# Patient Record
Sex: Male | Born: 1960
Health system: Southern US, Community
[De-identification: ages and names within clinical notes are randomized; demographics above are authoritative.]

## PROBLEM LIST (undated history)

## (undated) DIAGNOSIS — J189 Pneumonia, unspecified organism: Secondary | ICD-10-CM

## (undated) DIAGNOSIS — J869 Pyothorax without fistula: Secondary | ICD-10-CM

## (undated) DIAGNOSIS — R519 Headache, unspecified: Secondary | ICD-10-CM

## (undated) DIAGNOSIS — F329 Major depressive disorder, single episode, unspecified: Secondary | ICD-10-CM

## (undated) DIAGNOSIS — F32A Depression, unspecified: Secondary | ICD-10-CM

## (undated) DIAGNOSIS — I10 Essential (primary) hypertension: Secondary | ICD-10-CM

## (undated) DIAGNOSIS — R51 Headache: Secondary | ICD-10-CM

## (undated) HISTORY — PX: FINGER SURGERY: SHX640

## (undated) HISTORY — DX: Depression, unspecified: F32.A

## (undated) HISTORY — DX: Headache: R51

## (undated) HISTORY — DX: Major depressive disorder, single episode, unspecified: F32.9

## (undated) HISTORY — PX: OTHER SURGICAL HISTORY: SHX169

## (undated) HISTORY — DX: Pneumonia, unspecified organism: J18.9

## (undated) HISTORY — DX: Headache, unspecified: R51.9

## (undated) HISTORY — DX: Pyothorax without fistula: J86.9

---

## 1999-05-30 ENCOUNTER — Encounter: Payer: Self-pay | Admitting: Family Medicine

## 1999-05-30 ENCOUNTER — Encounter: Admission: RE | Admit: 1999-05-30 | Discharge: 1999-05-30 | Payer: Self-pay | Admitting: Family Medicine

## 2002-12-11 ENCOUNTER — Encounter: Payer: Self-pay | Admitting: Family Medicine

## 2002-12-11 ENCOUNTER — Encounter: Admission: RE | Admit: 2002-12-11 | Discharge: 2002-12-11 | Payer: Self-pay | Admitting: Family Medicine

## 2004-03-10 ENCOUNTER — Emergency Department (HOSPITAL_COMMUNITY): Admission: EM | Admit: 2004-03-10 | Discharge: 2004-03-10 | Payer: Self-pay | Admitting: Emergency Medicine

## 2008-01-01 ENCOUNTER — Emergency Department (HOSPITAL_COMMUNITY): Admission: EM | Admit: 2008-01-01 | Discharge: 2008-01-01 | Payer: Self-pay | Admitting: Family Medicine

## 2015-09-07 ENCOUNTER — Ambulatory Visit: Payer: Self-pay | Admitting: Family Medicine

## 2015-11-04 ENCOUNTER — Encounter: Payer: Self-pay | Admitting: Family Medicine

## 2015-11-04 ENCOUNTER — Encounter (INDEPENDENT_AMBULATORY_CARE_PROVIDER_SITE_OTHER): Payer: Self-pay

## 2015-11-04 ENCOUNTER — Ambulatory Visit (INDEPENDENT_AMBULATORY_CARE_PROVIDER_SITE_OTHER): Payer: BLUE CROSS/BLUE SHIELD | Admitting: Family Medicine

## 2015-11-04 VITALS — BP 122/88 | HR 87 | Temp 98.5°F | Ht 67.5 in | Wt 183.8 lb

## 2015-11-04 DIAGNOSIS — Z1211 Encounter for screening for malignant neoplasm of colon: Secondary | ICD-10-CM | POA: Diagnosis not present

## 2015-11-04 DIAGNOSIS — Z0001 Encounter for general adult medical examination with abnormal findings: Secondary | ICD-10-CM

## 2015-11-04 DIAGNOSIS — R030 Elevated blood-pressure reading, without diagnosis of hypertension: Secondary | ICD-10-CM

## 2015-11-04 DIAGNOSIS — Z113 Encounter for screening for infections with a predominantly sexual mode of transmission: Secondary | ICD-10-CM | POA: Diagnosis not present

## 2015-11-04 DIAGNOSIS — Z0000 Encounter for general adult medical examination without abnormal findings: Secondary | ICD-10-CM | POA: Insufficient documentation

## 2015-11-04 DIAGNOSIS — R6889 Other general symptoms and signs: Secondary | ICD-10-CM

## 2015-11-04 DIAGNOSIS — Z13220 Encounter for screening for lipoid disorders: Secondary | ICD-10-CM | POA: Diagnosis not present

## 2015-11-04 DIAGNOSIS — K5900 Constipation, unspecified: Secondary | ICD-10-CM | POA: Diagnosis not present

## 2015-11-04 DIAGNOSIS — I10 Essential (primary) hypertension: Secondary | ICD-10-CM | POA: Insufficient documentation

## 2015-11-04 DIAGNOSIS — IMO0001 Reserved for inherently not codable concepts without codable children: Secondary | ICD-10-CM

## 2015-11-04 NOTE — Progress Notes (Signed)
Patient ID: NANCY MANUELE, male   DOB: September 14, 1960, 55 y.o.   MRN: [id number]  Tommi Rumps, MD Phone: [phone]  THUNDER BRIDGEWATER is a 55 y.o. male who presents today for new patient visit.  Reports he is here for a physical exam. Diet consists of mostly fast food and junk food. Has decreased to once a day. Minimal vegetables. Walks 2 miles total per week. HIV test done 1 year ago. Hepatitis C done 2 years ago. Tetanus done 2 years ago. No prior colonoscopy. No family history of prostate cancer. Is followed by ophthalmology for vision. Also sees a dentist. Note some constipation. Has a bowel movement every 2-3 days. No abdominal discomfort. No blood in his stool. Large round balls. Intermittently he strains. Attributes this to his diet. Patient also notes he states hot all the time. Intermittently sweats. No palpitations. No hair changes. No skin changes. No family history of thyroid dysfunction. No night sweats.  Active Ambulatory Problems    Diagnosis Date Noted  . Encounter for general adult medical examination with abnormal findings 11/04/2015  . Elevated blood pressure 11/04/2015  . Constipation 11/04/2015  . Heat intolerance 11/04/2015   Resolved Ambulatory Problems    Diagnosis Date Noted  . No Resolved Ambulatory Problems   Past Medical History  Diagnosis Date  . Depression   . Frequent headaches     Family History  Problem Relation Age of Onset  . Depression      Social History   Social History  . Marital Status: Single    Spouse Name: N/A  . Number of Children: N/A  . Years of Education: N/A   Occupational History  . Not on file.   Social History Main Topics  . Smoking status: Never Smoker   . Smokeless tobacco: Never Used  . Alcohol Use: No  . Drug Use: No  . Sexual Activity: Not on file   Other Topics Concern  . Not on file   Social History Narrative  . No narrative on file    ROS  General:  Negative for nexplained weight loss,  fever Skin: Negative for new or changing mole, sore that won't heal HEENT: Positive for trouble seeing, Negative for trouble hearing, ringing in ears, mouth sores, hoarseness, change in voice, dysphagia. CV:  Negative for chest pain, dyspnea, edema, palpitations Resp: Negative for cough, dyspnea, hemoptysis GI: Positive for constipation, Negative for nausea, vomiting, diarrhea, abdominal pain, melena, hematochezia. GU: Negative for dysuria, incontinence, urinary hesitance, hematuria, vaginal or penile discharge, polyuria, sexual difficulty, lumps in testicle or breasts MSK: Negative for muscle cramps or aches, joint pain or swelling Neuro: Negative for headaches, weakness, numbness, dizziness, passing out/fainting Psych: Negative for depression, anxiety, memory problems  Objective  Physical Exam Filed Vitals:   11/04/15 1517 11/04/15 1547  BP: 130/94 122/88  Pulse: 87   Temp: 98.5 F (36.9 C)     BP Readings from Last 3 Encounters:  11/04/15 122/88   Wt Readings from Last 3 Encounters:  11/04/15 183 lb 12.8 oz (83.371 kg)    Physical Exam  Constitutional: He is well-developed, well-nourished, and in no distress.  HENT:  Head: Normocephalic and atraumatic.  Right Ear: External ear normal.  Left Ear: External ear normal.  Mouth/Throat: Oropharynx is clear and moist. No oropharyngeal exudate.  Eyes: Conjunctivae are normal. Pupils are equal, round, and reactive to light.  Neck: Neck supple.  Cardiovascular: Normal rate, regular rhythm and normal heart sounds.   Pulmonary/Chest: Effort normal and  breath sounds normal.  Abdominal: Soft. Bowel sounds are normal. He exhibits no distension. There is no tenderness. There is no rebound and no guarding.  Genitourinary:  Declined prostate exam  Musculoskeletal: He exhibits no edema.  Lymphadenopathy:    He has no cervical adenopathy.  Neurological: He is alert. Gait normal.  Skin: Skin is warm and dry. He is not diaphoretic.    Psychiatric: Mood and affect normal.     Assessment/Plan:   Encounter for general adult medical examination with abnormal findings Blood pressure slightly elevated. He will return for recheck in 1 week. HIV up-to-date. He is requesting STD screening though. This will be completed as outlined below. Hepatitis C was done 2 years ago. Tetanus vaccine is up-to-date. He will be referred to GI for colonoscopy. Discussed diet and exercise at length with patient and patient will make small changes to begin with to 2-3 meals a week and snacks. Labs as outlined below.  Elevated blood pressure Elevated on initial check. We'll have him return in 1 week for repeat with nursing in for fasting lab work.  Constipation Suspect this is related to his poor diet. Discussed dietary changes. Discussed addition of fiber. Patient is hesitant to add medications to help with this. He'll continue to monitor.   Heat intolerance Patient states hot all the time. No other symptoms of hyperthyroidism. We'll check a TSH.    Orders Placed This Encounter  Procedures  . HIV antibody (with reflex)    Standing Status: Future     Number of Occurrences:      Standing Expiration Date: 11/03/2016  . RPR    Standing Status: Future     Number of Occurrences:      Standing Expiration Date: 11/03/2016  . Comp Met (CMET)    Standing Status: Future     Number of Occurrences:      Standing Expiration Date: 11/03/2016  . TSH    Standing Status: Future     Number of Occurrences:      Standing Expiration Date: 11/03/2016  . CBC    Standing Status: Future     Number of Occurrences:      Standing Expiration Date: 11/03/2016  . HgB A1c    Standing Status: Future     Number of Occurrences:      Standing Expiration Date: 11/03/2016  . Lipid Profile    Standing Status: Future     Number of Occurrences:      Standing Expiration Date: 11/03/2016  . PSA    Standing Status: Future     Number of Occurrences:      Standing  Expiration Date: 11/03/2016  . Ambulatory referral to Gastroenterology    Referral Priority:  Routine    Referral Type:  Consultation    Referral Reason:  Specialty Services Required    Number of Visits Requested:  Wood, MD [place]

## 2015-11-04 NOTE — Patient Instructions (Signed)
Nice to meet you. We will check some lab work to evaluate for [employer] a hot all the time. We will also obtain screening lab work. We will get you referred for colonoscopy. Please start working on your diet by changing 2-3 meals a week and snacks a week. Snacks can include vegetables and fruit. He should add vegetables to dinner. Increase her exercises well.

## 2015-11-04 NOTE — Assessment & Plan Note (Signed)
Patient states hot all the time. No other symptoms of hyperthyroidism. We'll check a TSH.

## 2015-11-04 NOTE — Assessment & Plan Note (Signed)
Blood pressure slightly elevated. He will return for recheck in 1 week. HIV up-to-date. He is requesting STD screening though. This will be completed as outlined below. Hepatitis C was done 2 years ago. Tetanus vaccine is up-to-date. He will be referred to GI for colonoscopy. Discussed diet and exercise at length with patient and patient will make small changes to begin with to 2-3 meals a week and snacks. Labs as outlined below.

## 2015-11-04 NOTE — Assessment & Plan Note (Signed)
Suspect this is related to his poor diet. Discussed dietary changes. Discussed addition of fiber. Patient is hesitant to add medications to help with this. He'll continue to monitor.

## 2015-11-04 NOTE — Progress Notes (Signed)
Pre visit review using our clinic review tool, if applicable. No additional management support is needed unless otherwise documented below in the visit note. 

## 2015-11-04 NOTE — Assessment & Plan Note (Signed)
Elevated on initial check. We'll have him return in 1 week for repeat with nursing in for fasting lab work.

## 2015-11-25 ENCOUNTER — Other Ambulatory Visit (INDEPENDENT_AMBULATORY_CARE_PROVIDER_SITE_OTHER): Payer: BLUE CROSS/BLUE SHIELD

## 2015-11-25 ENCOUNTER — Other Ambulatory Visit: Payer: Self-pay | Admitting: Family Medicine

## 2015-11-25 DIAGNOSIS — Z0001 Encounter for general adult medical examination with abnormal findings: Secondary | ICD-10-CM | POA: Diagnosis not present

## 2015-11-25 DIAGNOSIS — Z13220 Encounter for screening for lipoid disorders: Secondary | ICD-10-CM | POA: Diagnosis not present

## 2015-11-25 DIAGNOSIS — Z113 Encounter for screening for infections with a predominantly sexual mode of transmission: Secondary | ICD-10-CM | POA: Diagnosis not present

## 2015-11-25 DIAGNOSIS — R6889 Other general symptoms and signs: Secondary | ICD-10-CM

## 2015-11-25 LAB — COMPREHENSIVE METABOLIC PANEL
ALT: 25 U/L (ref 0–53)
AST: 19 U/L (ref 0–37)
Albumin: 4.4 g/dL (ref 3.5–5.2)
Alkaline Phosphatase: 64 U/L (ref 39–117)
BUN: 22 mg/dL (ref 6–23)
CALCIUM: 9.4 mg/dL (ref 8.4–10.5)
CHLORIDE: 106 meq/L (ref 96–112)
CO2: 28 meq/L (ref 19–32)
Creatinine, Ser: 1.18 mg/dL (ref 0.40–1.50)
GFR: 68.7 mL/min (ref >60.00)
Glucose, Bld: 105 mg/dL — ABNORMAL HIGH (ref 70–99)
POTASSIUM: 4.2 meq/L (ref 3.5–5.1)
Sodium: 141 mEq/L (ref 135–145)
Total Bilirubin: 0.7 mg/dL (ref 0.2–1.2)
Total Protein: 7.6 g/dL (ref 6.0–8.3)

## 2015-11-25 LAB — CBC
HEMATOCRIT: 48.1 % (ref 39.0–52.0)
HEMOGLOBIN: 16 g/dL (ref 13.0–17.0)
MCHC: 33.3 g/dL (ref 30.0–36.0)
MCV: 87 fl (ref 78.0–100.0)
PLATELETS: 197 10*3/uL (ref 150.0–400.0)
RBC: 5.52 Mil/uL (ref 4.22–5.81)
RDW: 13.7 % (ref 11.5–15.5)
WBC: 7.6 10*3/uL (ref 4.0–10.5)

## 2015-11-25 LAB — LIPID PANEL
CHOL/HDL RATIO: 4
Cholesterol: 153 mg/dL (ref 0–200)
HDL: 35.5 mg/dL — AB (ref >39.00)
LDL Cholesterol: 96 mg/dL (ref 0–99)
NONHDL: 117.27
Triglycerides: 106 mg/dL (ref 0.0–149.0)
VLDL: 21.2 mg/dL (ref 0.0–40.0)

## 2015-11-25 LAB — HEMOGLOBIN A1C: HEMOGLOBIN A1C: 5.5 % (ref 4.6–6.5)

## 2015-11-25 LAB — PSA: PSA: 3.08 ng/mL (ref 0.10–4.00)

## 2015-11-25 LAB — TSH: TSH: 1.01 u[IU]/mL (ref 0.35–4.50)

## 2015-11-25 NOTE — Addendum Note (Signed)
Addended by: Jennelle HumanNEWTON, Akeia Perot E on: 11/25/2015 09:17 AM   Modules accepted: Orders

## 2015-11-26 LAB — HIV ANTIBODY (ROUTINE TESTING W REFLEX): HIV: NONREACTIVE

## 2015-11-26 LAB — GC/CHLAMYDIA PROBE AMP
CT PROBE, AMP APTIMA: NOT DETECTED
GC Probe RNA: NOT DETECTED

## 2015-11-26 LAB — RPR

## 2015-12-01 ENCOUNTER — Encounter: Payer: Self-pay | Admitting: Family Medicine

## 2015-12-07 ENCOUNTER — Encounter: Payer: Self-pay | Admitting: Family Medicine

## 2015-12-07 ENCOUNTER — Ambulatory Visit (INDEPENDENT_AMBULATORY_CARE_PROVIDER_SITE_OTHER): Payer: BLUE CROSS/BLUE SHIELD | Admitting: Family Medicine

## 2015-12-07 VITALS — BP 135/92 | HR 83 | Temp 98.1°F | Wt 189.0 lb

## 2015-12-07 DIAGNOSIS — Q828 Other specified congenital malformations of skin: Secondary | ICD-10-CM | POA: Diagnosis not present

## 2015-12-07 DIAGNOSIS — I10 Essential (primary) hypertension: Secondary | ICD-10-CM | POA: Diagnosis not present

## 2015-12-07 DIAGNOSIS — L918 Other hypertrophic disorders of the skin: Secondary | ICD-10-CM | POA: Diagnosis not present

## 2015-12-07 MED ORDER — AMLODIPINE BESYLATE 5 MG PO TABS
5.0000 mg | ORAL_TABLET | Freq: Every day | ORAL | Status: DC
Start: 2015-12-07 — End: 2016-11-22

## 2015-12-07 NOTE — Patient Instructions (Signed)
Nice to see you. Your blood pressure remains elevated today. We'll start you on a low dose of amlodipine. You should monitor your blood pressure at home and if not improving to less than 140/90 please let us know. I'll see you back in a month for recheck of this. I have also referred you to dermatology.

## 2015-12-07 NOTE — Progress Notes (Signed)
Patient ID: Caleb Avila, male   DOB: 08/09/1960, 55 y.o.   MRN: [id number]  Caleb AlarEric Yariela Tison, MD Phone: [phone]  Caleb Avila is a 55 y.o. male who presents today for f/u.  Elevated blood pressure: Elevated on first check at last office visit return to slightly below goal diastolically. Elevated today. No chest pain or shortness of breath or edema. No medications at this time.  Skin tags: Patient notes these around his neck. They get irritated and get tugged on and occasionally get pulled off. He has not had any of them removed by a physician previously.   PMH: nonsmoker.   ROS see history of present illness  Objective  Physical Exam Filed Vitals:   12/07/15 1524  BP: 135/92  Pulse: 83  Temp: 98.1 F (36.7 C)    BP Readings from Last 3 Encounters:  12/07/15 135/92  11/04/15 122/88   Wt Readings from Last 3 Encounters:  12/07/15 189 lb (85.73 kg)  11/04/15 183 lb 12.8 oz (83.371 kg)    Physical Exam  Constitutional: He is well-developed, well-nourished, and in no distress.  HENT:  Head: Normocephalic and atraumatic.  Right Ear: External ear normal.  Left Ear: External ear normal.  Scattered skin tags around neck  Cardiovascular: Normal rate, regular rhythm and normal heart sounds.   Pulmonary/Chest: Effort normal and breath sounds normal.  Neurological: He is alert. Gait normal.  Skin: Skin is warm and dry. He is not diaphoretic.     Assessment/Plan: Please see individual problem list.  Essential hypertension Elevated on 2 occasions. Start on amlodipine. Prior lab work reviewed with patient.  Cutaneous skin tags Refer to dermatology for consideration of cryotherapy    Orders Placed This Encounter  Procedures  . Ambulatory referral to Dermatology    Referral Priority:  Routine    Referral Type:  Consultation    Referral Reason:  Specialty Services Required    Requested Specialty:  Dermatology    Number of Visits Requested:  1    Meds  ordered this encounter  Medications  . amLODipine (NORVASC) 5 MG tablet    Sig: Take 1 tablet (5 mg total) by mouth daily.    Dispense:  90 tablet    Refill:  3   Caleb AlarEric Ikran Patman, MD [facility] [place]

## 2015-12-07 NOTE — Assessment & Plan Note (Signed)
Elevated on 2 occasions. Start on amlodipine. Prior lab work reviewed with patient.

## 2015-12-07 NOTE — Progress Notes (Signed)
Pre visit review using our clinic review tool, if applicable. No additional management support is needed unless otherwise documented below in the visit note. 

## 2015-12-07 NOTE — Assessment & Plan Note (Signed)
Refer to dermatology for consideration of cryotherapy

## 2016-01-09 ENCOUNTER — Encounter: Payer: Self-pay | Admitting: Family Medicine

## 2016-01-09 ENCOUNTER — Ambulatory Visit: Payer: BLUE CROSS/BLUE SHIELD | Admitting: Family Medicine

## 2016-11-22 ENCOUNTER — Other Ambulatory Visit: Payer: Self-pay | Admitting: Family Medicine

## 2016-11-22 DIAGNOSIS — I10 Essential (primary) hypertension: Secondary | ICD-10-CM

## 2017-01-07 ENCOUNTER — Emergency Department (HOSPITAL_COMMUNITY): Payer: BLUE CROSS/BLUE SHIELD

## 2017-01-07 ENCOUNTER — Encounter (HOSPITAL_COMMUNITY): Payer: Self-pay | Admitting: *Deleted

## 2017-01-07 ENCOUNTER — Inpatient Hospital Stay (HOSPITAL_COMMUNITY)
Admission: EM | Admit: 2017-01-07 | Discharge: 2017-01-20 | DRG: 853 | Disposition: A | Discharge status: Home / Self Care | Payer: BLUE CROSS/BLUE SHIELD | Attending: Internal Medicine | Admitting: Internal Medicine

## 2017-01-07 DIAGNOSIS — Z79899 Other long term (current) drug therapy: Secondary | ICD-10-CM

## 2017-01-07 DIAGNOSIS — R0682 Tachypnea, not elsewhere classified: Secondary | ICD-10-CM | POA: Diagnosis not present

## 2017-01-07 DIAGNOSIS — J9601 Acute respiratory failure with hypoxia: Secondary | ICD-10-CM | POA: Diagnosis present

## 2017-01-07 DIAGNOSIS — J869 Pyothorax without fistula: Secondary | ICD-10-CM | POA: Diagnosis present

## 2017-01-07 DIAGNOSIS — K5900 Constipation, unspecified: Secondary | ICD-10-CM | POA: Diagnosis present

## 2017-01-07 DIAGNOSIS — J189 Pneumonia, unspecified organism: Secondary | ICD-10-CM

## 2017-01-07 DIAGNOSIS — T17990A Other foreign object in respiratory tract, part unspecified in causing asphyxiation, initial encounter: Secondary | ICD-10-CM | POA: Diagnosis present

## 2017-01-07 DIAGNOSIS — R0602 Shortness of breath: Secondary | ICD-10-CM

## 2017-01-07 DIAGNOSIS — J181 Lobar pneumonia, unspecified organism: Secondary | ICD-10-CM | POA: Diagnosis not present

## 2017-01-07 DIAGNOSIS — A419 Sepsis, unspecified organism: Secondary | ICD-10-CM | POA: Diagnosis not present

## 2017-01-07 DIAGNOSIS — E876 Hypokalemia: Secondary | ICD-10-CM | POA: Diagnosis present

## 2017-01-07 DIAGNOSIS — R0781 Pleurodynia: Secondary | ICD-10-CM | POA: Diagnosis not present

## 2017-01-07 DIAGNOSIS — I10 Essential (primary) hypertension: Secondary | ICD-10-CM | POA: Diagnosis present

## 2017-01-07 DIAGNOSIS — I248 Other forms of acute ischemic heart disease: Secondary | ICD-10-CM | POA: Diagnosis present

## 2017-01-07 DIAGNOSIS — Z818 Family history of other mental and behavioral disorders: Secondary | ICD-10-CM | POA: Diagnosis not present

## 2017-01-07 DIAGNOSIS — R0789 Other chest pain: Secondary | ICD-10-CM | POA: Diagnosis not present

## 2017-01-07 DIAGNOSIS — J939 Pneumothorax, unspecified: Secondary | ICD-10-CM | POA: Diagnosis not present

## 2017-01-07 DIAGNOSIS — R0600 Dyspnea, unspecified: Secondary | ICD-10-CM | POA: Diagnosis not present

## 2017-01-07 DIAGNOSIS — Z4682 Encounter for fitting and adjustment of non-vascular catheter: Secondary | ICD-10-CM | POA: Diagnosis not present

## 2017-01-07 DIAGNOSIS — M546 Pain in thoracic spine: Secondary | ICD-10-CM | POA: Diagnosis not present

## 2017-01-07 DIAGNOSIS — J90 Pleural effusion, not elsewhere classified: Secondary | ICD-10-CM | POA: Diagnosis not present

## 2017-01-07 DIAGNOSIS — R000 Tachycardia, unspecified: Secondary | ICD-10-CM | POA: Diagnosis present

## 2017-01-07 DIAGNOSIS — I447 Left bundle-branch block, unspecified: Secondary | ICD-10-CM | POA: Diagnosis not present

## 2017-01-07 DIAGNOSIS — R0902 Hypoxemia: Secondary | ICD-10-CM | POA: Diagnosis present

## 2017-01-07 DIAGNOSIS — R079 Chest pain, unspecified: Secondary | ICD-10-CM | POA: Diagnosis not present

## 2017-01-07 DIAGNOSIS — Z9889 Other specified postprocedural states: Secondary | ICD-10-CM

## 2017-01-07 DIAGNOSIS — R740 Nonspecific elevation of levels of transaminase and lactic acid dehydrogenase [LDH]: Secondary | ICD-10-CM | POA: Diagnosis not present

## 2017-01-07 DIAGNOSIS — J9811 Atelectasis: Secondary | ICD-10-CM | POA: Diagnosis present

## 2017-01-07 DIAGNOSIS — R748 Abnormal levels of other serum enzymes: Secondary | ICD-10-CM | POA: Diagnosis not present

## 2017-01-07 DIAGNOSIS — R9431 Abnormal electrocardiogram [ECG] [EKG]: Secondary | ICD-10-CM | POA: Diagnosis not present

## 2017-01-07 DIAGNOSIS — R52 Pain, unspecified: Secondary | ICD-10-CM | POA: Diagnosis not present

## 2017-01-07 DIAGNOSIS — J948 Other specified pleural conditions: Secondary | ICD-10-CM | POA: Diagnosis not present

## 2017-01-07 HISTORY — DX: Essential (primary) hypertension: I10

## 2017-01-07 HISTORY — DX: Pneumonia, unspecified organism: J18.9

## 2017-01-07 LAB — CBC
HCT: 40.3 % (ref 39.0–52.0)
Hemoglobin: 13.8 g/dL (ref 13.0–17.0)
MCH: 29.5 pg (ref 26.0–34.0)
MCHC: 34.2 g/dL (ref 30.0–36.0)
MCV: 86.1 fL (ref 78.0–100.0)
PLATELETS: 211 10*3/uL (ref 150–400)
RBC: 4.68 MIL/uL (ref 4.22–5.81)
RDW: 13.2 % (ref 11.5–15.5)
WBC: 17.1 10*3/uL — ABNORMAL HIGH (ref 4.0–10.5)

## 2017-01-07 LAB — BASIC METABOLIC PANEL
Anion gap: 9 (ref 5–15)
BUN: 19 mg/dL (ref 6–20)
CHLORIDE: 105 mmol/L (ref 101–111)
CO2: 22 mmol/L (ref 22–32)
CREATININE: 1.16 mg/dL (ref 0.61–1.24)
Calcium: 8.8 mg/dL — ABNORMAL LOW (ref 8.9–10.3)
GFR calc Af Amer: >60 mL/min (ref >60)
GFR calc non Af Amer: >60 mL/min (ref >60)
GLUCOSE: 140 mg/dL — AB (ref 65–99)
Potassium: 3.8 mmol/L (ref 3.5–5.1)
Sodium: 136 mmol/L (ref 135–145)

## 2017-01-07 LAB — I-STAT TROPONIN, ED: Troponin i, poc: 0 ng/mL (ref 0.00–0.08)

## 2017-01-07 LAB — I-STAT CHEM 8, ED
BUN: 23 mg/dL — AB (ref 6–20)
CREATININE: 1 mg/dL (ref 0.61–1.24)
Calcium, Ion: 1.09 mmol/L — ABNORMAL LOW (ref 1.15–1.40)
Chloride: 103 mmol/L (ref 101–111)
Glucose, Bld: 141 mg/dL — ABNORMAL HIGH (ref 65–99)
HEMATOCRIT: 43 % (ref 39.0–52.0)
Hemoglobin: 14.6 g/dL (ref 13.0–17.0)
Potassium: 3.8 mmol/L (ref 3.5–5.1)
Sodium: 141 mmol/L (ref 135–145)
TCO2: 23 mmol/L (ref 0–100)

## 2017-01-07 LAB — PROCALCITONIN: Procalcitonin: 0.39 ng/mL

## 2017-01-07 LAB — TROPONIN I: Troponin I: <0.03 ng/mL (ref <0.03)

## 2017-01-07 LAB — LACTIC ACID, PLASMA
Lactic Acid, Venous: 0.9 mmol/L (ref 0.5–1.9)
Lactic Acid, Venous: 1.3 mmol/L (ref 0.5–1.9)

## 2017-01-07 MED ORDER — CEFTRIAXONE SODIUM 1 G IJ SOLR
1.0000 g | INTRAMUSCULAR | Status: DC
  Administered 2017-01-07: 1 g via INTRAVENOUS
  Filled 2017-01-07: qty 10

## 2017-01-07 MED ORDER — ADULT MULTIVITAMIN W/MINERALS CH
1.0000 | ORAL_TABLET | Freq: Every day | ORAL | Status: DC
  Administered 2017-01-08 – 2017-01-13 (×6): 1 via ORAL
  Filled 2017-01-07 (×6): qty 1

## 2017-01-07 MED ORDER — IOPAMIDOL (ISOVUE-370) INJECTION 76%
INTRAVENOUS | Status: AC
  Administered 2017-01-07: 100 mL via INTRAVENOUS
  Filled 2017-01-07: qty 100

## 2017-01-07 MED ORDER — HYDROXYZINE HCL 10 MG PO TABS
10.0000 mg | ORAL_TABLET | Freq: Three times a day (TID) | ORAL | Status: DC | PRN
  Filled 2017-01-07: qty 1

## 2017-01-07 MED ORDER — ZOLPIDEM TARTRATE 5 MG PO TABS
5.0000 mg | ORAL_TABLET | Freq: Every evening | ORAL | Status: DC | PRN
  Administered 2017-01-07 – 2017-01-12 (×3): 5 mg via ORAL
  Filled 2017-01-07 (×3): qty 1

## 2017-01-07 MED ORDER — ENOXAPARIN SODIUM 40 MG/0.4ML ~~LOC~~ SOLN
40.0000 mg | SUBCUTANEOUS | Status: DC
  Administered 2017-01-07 – 2017-01-12 (×6): 40 mg via SUBCUTANEOUS
  Filled 2017-01-07 (×6): qty 0.4

## 2017-01-07 MED ORDER — LEVALBUTEROL HCL 1.25 MG/0.5ML IN NEBU: 1.2500 mg | INHALATION_SOLUTION | Freq: Three times a day (TID) | RESPIRATORY_TRACT | Status: DC

## 2017-01-07 MED ORDER — ACETAMINOPHEN 325 MG PO TABS
650.0000 mg | ORAL_TABLET | Freq: Four times a day (QID) | ORAL | Status: DC | PRN
  Administered 2017-01-08 – 2017-01-11 (×5): 650 mg via ORAL
  Filled 2017-01-07 (×6): qty 2

## 2017-01-07 MED ORDER — AMLODIPINE BESYLATE 5 MG PO TABS
5.0000 mg | ORAL_TABLET | Freq: Every day | ORAL | Status: DC
  Administered 2017-01-08: 5 mg via ORAL
  Filled 2017-01-07: qty 1

## 2017-01-07 MED ORDER — HYDRALAZINE HCL 20 MG/ML IJ SOLN
5.0000 mg | INTRAMUSCULAR | Status: DC | PRN
  Administered 2017-01-09: 5 mg via INTRAVENOUS
  Filled 2017-01-07: qty 1

## 2017-01-07 MED ORDER — ONDANSETRON HCL 4 MG/2ML IJ SOLN: 4.0000 mg | Freq: Three times a day (TID) | INTRAMUSCULAR | Status: DC | PRN

## 2017-01-07 MED ORDER — LEVALBUTEROL HCL 1.25 MG/0.5ML IN NEBU
1.2500 mg | INHALATION_SOLUTION | Freq: Four times a day (QID) | RESPIRATORY_TRACT | Status: DC
  Administered 2017-01-07: 1.25 mg via RESPIRATORY_TRACT
  Filled 2017-01-07 (×4): qty 0.5

## 2017-01-07 MED ORDER — SODIUM CHLORIDE 0.9 % IV BOLUS (SEPSIS): 2500.0000 mL | Freq: Once | INTRAVENOUS | Status: DC

## 2017-01-07 MED ORDER — DOXYCYCLINE HYCLATE 100 MG IV SOLR
100.0000 mg | Freq: Two times a day (BID) | INTRAVENOUS | Status: DC
  Administered 2017-01-07 – 2017-01-08 (×2): 100 mg via INTRAVENOUS
  Filled 2017-01-07 (×2): qty 100

## 2017-01-07 MED ORDER — DM-GUAIFENESIN ER 30-600 MG PO TB12
1.0000 | ORAL_TABLET | Freq: Two times a day (BID) | ORAL | Status: DC | PRN
  Administered 2017-01-07: 1 via ORAL
  Filled 2017-01-07: qty 1

## 2017-01-07 MED ORDER — OXYCODONE-ACETAMINOPHEN 5-325 MG PO TABS
1.0000 | ORAL_TABLET | ORAL | Status: DC | PRN
  Administered 2017-01-07 – 2017-01-12 (×14): 1 via ORAL
  Filled 2017-01-07 (×14): qty 1

## 2017-01-07 MED ORDER — DEXTROSE 5 % IV SOLN: 500.0000 mg | INTRAVENOUS | Status: DC

## 2017-01-07 MED ORDER — DEXTROSE 5 % IV SOLN: 1.0000 g | Freq: Once | INTRAVENOUS | Status: DC

## 2017-01-07 MED ORDER — DEXTROSE 5 % IV SOLN: 500.0000 mg | Freq: Once | INTRAVENOUS | Status: DC

## 2017-01-07 MED ORDER — SODIUM CHLORIDE 0.9 % IV SOLN
INTRAVENOUS | Status: DC
  Administered 2017-01-07 – 2017-01-11 (×8): via INTRAVENOUS

## 2017-01-07 MED ORDER — SODIUM CHLORIDE 0.9 % IV BOLUS (SEPSIS)
2500.0000 mL | Freq: Once | INTRAVENOUS | Status: AC
  Administered 2017-01-07: 2500 mL via INTRAVENOUS

## 2017-01-07 NOTE — ED Provider Notes (Signed)
[facility]-EMERGENCY DEPT Provider Note   CSN: [id number] Arrival date & time: 01/07/17  1734     History   Chief Complaint Chief Complaint  Patient presents with  . Flank Pain    HPI Caleb Avila is a 56 y.o. male who  has a past medical history of Depression; Frequent headaches; and Hypertension. He presents for c/o severe left sided pleuritic cp. The patient states that yesterday he coughed and felt a small, but sharp pain in his left rib cage. It was persistent. Today he was at work, leaned over and then had sudden onset of severe left sided pleuritic pain and was unable to catch his breath. He was found to have hypoxia at his work.He denies history of PE, DVT. He does not smoke or have a smoking history. He has mild hypertension which is well controlled with one agent. He denies history of hypercholesterolemia or early MI in his family. Patient denies history of unilateral leg swelling, hemoptysis, recent confinement or surgery.  HPI  Past Medical History:  Diagnosis Date  . Depression   . Frequent headaches   . Hypertension     Patient Active Problem List   Diagnosis Date Noted  . CAP (community acquired pneumonia) 01/07/2017  . Sepsis (HCC) 01/07/2017  . Multifocal pneumonia 01/07/2017  . Cutaneous skin tags 12/07/2015  . Encounter for general adult medical examination with abnormal findings 11/04/2015  . Essential hypertension 11/04/2015  . Constipation 11/04/2015  . Heat intolerance 11/04/2015    History reviewed. No pertinent surgical history.     Home Medications    Prior to Admission medications   Medication Sig Start Date End Date Taking? Authorizing Provider  amLODipine (NORVASC) 5 MG tablet TAKE 1 TABLET (5 MG TOTAL) BY MOUTH DAILY. 11/22/16   Glori Luis, MD  Multiple Vitamin (MULTIVITAMIN) tablet Take 1 tablet by mouth daily.    [provider]    Family History Family History  Problem Relation Age of Onset  . Depression Unknown       Social History Social History  Substance Use Topics  . Smoking status: Never Smoker  . Smokeless tobacco: Never Used  . Alcohol use No     Allergies   Patient has no known allergies.   Review of Systems Review of Systems Ten systems reviewed and are negative for acute change, except as noted in the HPI.    Physical Exam Updated Vital Signs BP 121/85   Pulse (!) 102   Temp 99.6 F (37.6 C) (Oral)   Resp (!) 24   Ht 5\' 8"  (1.727 m)   Wt 85.7 kg (189 lb)   SpO2 97%   BMI 28.74 kg/m   Physical Exam Physical Exam  Nursing note and vitals reviewed. Constitutional: He appears well-developed and well-nourished. No distress.  HENT:  Head: Normocephalic and atraumatic.  Eyes: Conjunctivae normal are normal. No scleral icterus.  Neck: Normal range of motion. Neck supple.  Cardiovascular: Normal rate, regular rhythm and normal heart sounds.   Pulmonary/Chest: Shallow Shallow breathing. Normal breath sounds. No respiratory distress.  no Chest wall tenderness Abdominal: Soft. There is no tenderness.  Musculoskeletal: He exhibits no edema.  Neurological: He is alert.  Skin: Skin is warm and dry. He is not diaphoretic.  Psychiatric: His behavior is normal.     ED Treatments / Results  Labs (all labs ordered are listed, but only abnormal results are displayed) Labs Reviewed  BASIC METABOLIC PANEL - Abnormal; Notable for the following:  Result Value   Glucose, Bld 140 (*)    Calcium 8.8 (*)    All other components within normal limits  CBC - Abnormal; Notable for the following:    WBC 17.1 (*)    All other components within normal limits  I-STAT CHEM 8, ED - Abnormal; Notable for the following:    BUN 23 (*)    Glucose, Bld 141 (*)    Calcium, Ion 1.09 (*)    All other components within normal limits  I-STAT TROPONIN, ED    EKG  EKG Interpretation  Date/Time:  Monday January 07 2017 17:53:17 EDT Ventricular Rate:  99 PR Interval:    QRS  Duration: 152 QT Interval:  401 QTC Calculation: 515 R Axis:   32 Text Interpretation:  Sinus rhythm IVCD, consider atypical LBBB Confirmed by Ranae PalmsYELVERTON  MD, DAVID ([id number]) on 01/07/2017 5:57:16 PM       Radiology Dg Chest 2 View  Result Date: 01/07/2017 CLINICAL DATA:  Acute onset of left flank pain and left-sided chest pain after sneezing yesterday. The pain was severe when moving from a kneeling position to a standing position. No discrete injury. EXAM: CHEST  2 VIEW COMPARISON:  None. FINDINGS: Cardiac silhouette upper normal in size. Hilar and mediastinal contours unremarkable. Consolidation in the left lower lobe associated with volume loss and elevation of the left hemidiaphragm. Linear atelectasis in the lingula. Lungs otherwise clear. Pulmonary vascularity normal. No convincing pleural effusions. Degenerative changes involving the lower thoracic and upper lumbar spine. IMPRESSION: Dense left lower lobe atelectasis (favored over pneumonia) and mild atelectasis involving the lingula. Electronically Signed   By: Hulan Saashomas  Lawrence M.D.   On: 01/07/2017 18:29   Ct Angio Chest Pe W/cm &/or Wo Cm  Result Date: 01/07/2017 CLINICAL DATA:  Left costal pain EXAM: CT ANGIOGRAPHY CHEST WITH CONTRAST TECHNIQUE: Multidetector CT imaging of the chest was performed using the standard protocol during bolus administration of intravenous contrast. Multiplanar CT image reconstructions and MIPs were obtained to evaluate the vascular anatomy. CONTRAST:  100 cc Isovue 370 intravenous COMPARISON:  Radiograph 01/07/2017 FINDINGS: Cardiovascular: Satisfactory opacification of the pulmonary arteries to the segmental level. No evidence of pulmonary embolism. Non aneurysmal aorta. No dissection is seen. Heart size within normal limits. No large pericardial effusion. Mediastinum/Nodes: Midline trachea. Esophagus within normal limits. No significantly enlarged mediastinal lymph nodes. No thyroid mass. Lungs/Pleura: Small  left-sided pleural effusion. Partial consolidation in the lingula and left lower lobe with streaky density in the right lower lobe. No pneumothorax. Upper Abdomen: No acute abnormality. Musculoskeletal: No chest wall abnormality. No acute or significant osseous findings. Review of the MIP images confirms the above findings. IMPRESSION: 1. Negative for acute pulmonary embolus or aortic dissection 2. Partial consolidations in the lingula, left lower lobe and right lower lobe suspicious for multifocal pneumonia. Small left-sided pleural effusion. Electronically Signed   By: Jasmine PangKim  Fujinaga M.D.   On: 01/07/2017 19:01    Procedures Procedures (including critical care time)  Medications Ordered in ED Medications  cefTRIAXone (ROCEPHIN) 1 g in dextrose 5 % 50 mL IVPB (not administered)  azithromycin (ZITHROMAX) 500 mg in dextrose 5 % 250 mL IVPB (not administered)  iopamidol (ISOVUE-370) 76 % injection (100 mLs Intravenous Contrast Given 01/07/17 1834)     Initial Impression / Assessment and Plan / ED Course  I have reviewed the triage vital signs and the nursing notes.  Pertinent labs & imaging results that were available during my care of the patient were reviewed  by me and considered in my medical decision making (see chart for details).      Patient with multifocal pneumonia on CT. Documented hypoxia may be secondary to pleuritic pain.  No hypoxia here in the ED. CT  Negative for PE. Placed on rocephin and azithromycin Will admit   Final Clinical Impressions(s) / ED Diagnoses   Final diagnoses:  Multifocal pneumonia    New Prescriptions New Prescriptions   No medications on file     Arthor Captain, Cordelia Poche 01/07/17 1942    Loren Racer, MD 01/10/17 2114

## 2017-01-07 NOTE — H&P (Signed)
History and Physical    Caleb Avila ZOX:[id number] DOB: 09/18/1960 DOA: 01/07/2017  Referring MD/NP/PA:   PCP: Glori LuisSonnenberg, [name], MD   Patient coming from:  The patient is coming from home.  At baseline, pt is independent for most of ADL.   Chief Complaint: Left Chest pain, left flank pain, shortness of breath, cough  HPI: Caleb Avila is a 56 y.o. male with medical history significant of hypertension, headaches, depression, who presents with left chest pain, left flank pain, shortness of breath, cough.  Patient states that he started having left-sided flank pain and chest pain since yesterday. The pain is constant, 10 out of 10 in severity, pleuritic, aggravated by deep breath and coughing. Patient has dry cough, denies fever or chills. Does not have runny nose or sore throat. Patient denies nausea, vomiting, diarrhea, abdominal pain, symptoms of UTI or unilateral weakness. No sick contact recently. No long traveling.  ED Course: pt was found to have WBC 17.1, negative troponin, creatinine 1.16, temperature 99.6, tachycardia, tachypnea, oxygen saturation to 94% on room air, CT angiogram of chest is negative for PE, but showed multifocal pneumonia.  Review of Systems:   General: no fevers, chills, no changes in body weight, has poor appetite, has fatigue HEENT: no blurry vision, hearing changes or sore throat Respiratory: has dyspnea, coughing, no wheezing CV: has chest pain, no palpitations GI: no nausea, vomiting, abdominal pain, diarrhea, constipation GU: no dysuria, burning on urination, increased urinary frequency, hematuria  Ext: no leg edema Neuro: no unilateral weakness, numbness, or tingling, no vision change or hearing loss Skin: no rash, no skin tear. MSK: No muscle spasm, no deformity, no limitation of range of movement in spin Heme: No easy bruising.  Travel history: No recent long distant travel.  Allergy: No Known Allergies  Past Medical History:    Diagnosis Date  . Depression   . Frequent headaches   . Hypertension     Past Surgical History:  Procedure Laterality Date  . chest wall cyst    . FINGER SURGERY Right     Social History:  reports that he has never smoked. He has never used smokeless tobacco. He reports that he does not drink alcohol or use drugs.  Family History:  Family History  Problem Relation Age of Onset  . Depression Unknown      Prior to Admission medications   Medication Sig Start Date End Date Taking? Authorizing Provider  amLODipine (NORVASC) 5 MG tablet TAKE 1 TABLET (5 MG TOTAL) BY MOUTH DAILY. 11/22/16   Glori LuisSonnenberg, [name], MD  Multiple Vitamin (MULTIVITAMIN) tablet Take 1 tablet by mouth daily.    [provider]    Physical Exam: Vitals:   01/07/17 1930 01/07/17 1945 01/07/17 2000 01/07/17 2026  BP: (!) 134/91 (!) 124/109 (!) 127/94   Pulse: 100 98 96   Resp: (!) 35 17 (!) 29   Temp:    99.3 F (37.4 C)  TempSrc:      SpO2: 96% 96% 96%   Weight:      Height:       General: Not in acute distress HEENT:       Eyes: PERRL, EOMI, no scleral icterus.       ENT: No discharge from the ears and nose, no pharynx injection, no tonsillar enlargement.        Neck: No JVD, no bruit, no mass felt. Heme: No neck lymph node enlargement. Cardiac: S1/S2, RRR, No murmurs, No gallops or  rubs. Respiratory : has coarse breathing sound bilaterally. No rales, wheezing, rhonchi or rubs. GI: Soft, nondistended, nontender, no rebound pain, no organomegaly, BS present. GU: No hematuria Ext: No pitting leg edema bilaterally. 2+DP/PT pulse bilaterally. Musculoskeletal: No joint deformities, No joint redness or warmth, no limitation of ROM in spin. Skin: No rashes.  Neuro: Alert, oriented X3, cranial nerves II-XII grossly intact, moves all extremities normally. Psych: Patient is not psychotic, no suicidal or hemocidal ideation.  Labs on Admission: I have personally reviewed following labs and imaging  studies  CBC:  Recent Labs Lab 01/07/17 1805 01/07/17 1812  WBC 17.1*  --   HGB 13.8 14.6  HCT 40.3 43.0  MCV 86.1  --   PLT 211  --    Basic Metabolic Panel:  Recent Labs Lab 01/07/17 1805 01/07/17 1812  NA 136 141  K 3.8 3.8  CL 105 103  CO2 22  --   GLUCOSE 140* 141*  BUN 19 23*  CREATININE 1.16 1.00  CALCIUM 8.8*  --    GFR: Estimated Creatinine Clearance: 91 mL/min (by C-G formula based on SCr of 1 mg/dL). Liver Function Tests: No results for input(s): AST, ALT, ALKPHOS, BILITOT, PROT, ALBUMIN in the last 168 hours. No results for input(s): LIPASE, AMYLASE in the last 168 hours. No results for input(s): AMMONIA in the last 168 hours. Coagulation Profile: No results for input(s): INR, PROTIME in the last 168 hours. Cardiac Enzymes: No results for input(s): CKTOTAL, CKMB, CKMBINDEX, TROPONINI in the last 168 hours. BNP (last 3 results) No results for input(s): PROBNP in the last 8760 hours. HbA1C: No results for input(s): HGBA1C in the last 72 hours. CBG: No results for input(s): GLUCAP in the last 168 hours. Lipid Profile: No results for input(s): CHOL, HDL, LDLCALC, TRIG, CHOLHDL, LDLDIRECT in the last 72 hours. Thyroid Function Tests: No results for input(s): TSH, T4TOTAL, FREET4, T3FREE, THYROIDAB in the last 72 hours. Anemia Panel: No results for input(s): VITAMINB12, FOLATE, FERRITIN, TIBC, IRON, RETICCTPCT in the last 72 hours. Urine analysis: No results found for: COLORURINE, APPEARANCEUR, LABSPEC, PHURINE, GLUCOSEU, HGBUR, BILIRUBINUR, KETONESUR, PROTEINUR, UROBILINOGEN, NITRITE, LEUKOCYTESUR Sepsis Labs: @LABRCNTIP (procalcitonin:4,lacticidven:4) )No results found for this or any previous visit (from the past 240 hour(s)).   Radiological Exams on Admission: Dg Chest 2 View  Result Date: 01/07/2017 CLINICAL DATA:  Acute onset of left flank pain and left-sided chest pain after sneezing yesterday. The pain was severe when moving from a kneeling  position to a standing position. No discrete injury. EXAM: CHEST  2 VIEW COMPARISON:  None. FINDINGS: Cardiac silhouette upper normal in size. Hilar and mediastinal contours unremarkable. Consolidation in the left lower lobe associated with volume loss and elevation of the left hemidiaphragm. Linear atelectasis in the lingula. Lungs otherwise clear. Pulmonary vascularity normal. No convincing pleural effusions. Degenerative changes involving the lower thoracic and upper lumbar spine. IMPRESSION: Dense left lower lobe atelectasis (favored over pneumonia) and mild atelectasis involving the lingula. Electronically Signed   By: Hulan Saas M.D.   On: 01/07/2017 18:29   Ct Angio Chest Pe W/cm &/or Wo Cm  Result Date: 01/07/2017 CLINICAL DATA:  Left costal pain EXAM: CT ANGIOGRAPHY CHEST WITH CONTRAST TECHNIQUE: Multidetector CT imaging of the chest was performed using the standard protocol during bolus administration of intravenous contrast. Multiplanar CT image reconstructions and MIPs were obtained to evaluate the vascular anatomy. CONTRAST:  100 cc Isovue 370 intravenous COMPARISON:  Radiograph 01/07/2017 FINDINGS: Cardiovascular: Satisfactory opacification of the pulmonary arteries to  the segmental level. No evidence of pulmonary embolism. Non aneurysmal aorta. No dissection is seen. Heart size within normal limits. No large pericardial effusion. Mediastinum/Nodes: Midline trachea. Esophagus within normal limits. No significantly enlarged mediastinal lymph nodes. No thyroid mass. Lungs/Pleura: Small left-sided pleural effusion. Partial consolidation in the lingula and left lower lobe with streaky density in the right lower lobe. No pneumothorax. Upper Abdomen: No acute abnormality. Musculoskeletal: No chest wall abnormality. No acute or significant osseous findings. Review of the MIP images confirms the above findings. IMPRESSION: 1. Negative for acute pulmonary embolus or aortic dissection 2. Partial  consolidations in the lingula, left lower lobe and right lower lobe suspicious for multifocal pneumonia. Small left-sided pleural effusion. Electronically Signed   By: Jasmine Pang M.D.   On: 01/07/2017 19:01     EKG: Independently reviewed.  Sinus rhythm, QTC 515, widening QRS wave, poor R-wave progression   Assessment/Plan Principal Problem:   CAP (community acquired pneumonia) Active Problems:   Essential hypertension   Sepsis (HCC)   Multifocal pneumonia   Acute respiratory failure with hypoxia (HCC)  Acute respiratory failure with hypoxia due to CAP and Multifocal pneumonia and sepsis:  CT angiogram of chest is negative for PE, but showed multifocal pneumonia. Patient meet criteria for sepsis with leukocytosis, tachycardia and tachypnea. Pending lactic acid level. Hemodynamics stable currently.  - will admit to tele bed as inpt - IV Rocephin and doxycycline (pt received one dose of azithromycin in ED, will switch to doxycycline due to QTc prolongation - Mucinex for cough  -Xopenex Neb prn for SOB - Urine legionella and S. pneumococcal antigen - Follow up blood culture x2, sputum culture and respiratory virus panel - will get Procalcitonin and trend lactic acid level per sepsis protocol - IVF: 2.5 L of NS bolus in ED, followed by 100 mL per hour of NS  -prn tylenol and percocet for pain  Essential hypertension: -Continue amlodipine -IVF hydralazine when necessary    DVT ppx: SQ Lovenox Code Status: Full code Family Communication: None at bed side.     Disposition Plan:  Anticipate discharge back to previous home environment Consults called:  none Admission status:  Inpatient/tele      Date of Service 01/07/2017    Lorretta Harp [facility] Pager [phone]  If 7PM-7AM, please contact night-coverage [email or web address] Password [facility] 01/07/2017, 8:31 PM

## 2017-01-07 NOTE — ED Triage Notes (Addendum)
Pt here via [facility] for L flank pain.  States yesterday he sneezed and he felt a twinge to L flank.  Today, when he moved from kneeling to standing position, he experienced excruciating pain.  Pain radiates from L flank to L chest.  Given 100 mcg fentanyl and 325 asa en-route.  Pain reduced from 10 to 6.  VS 120/90, hr 102, 92% 2L (88% on RA before narcotics), cbg 161.

## 2017-01-07 NOTE — ED Notes (Signed)
Report given to lynn on 6e

## 2017-01-07 NOTE — ED Notes (Signed)
PT back from Xray.  VS stable.  Speaking with MD.

## 2017-01-08 ENCOUNTER — Other Ambulatory Visit (HOSPITAL_COMMUNITY): Payer: BLUE CROSS/BLUE SHIELD

## 2017-01-08 ENCOUNTER — Encounter (HOSPITAL_COMMUNITY): Payer: Self-pay | Admitting: Internal Medicine

## 2017-01-08 LAB — RESPIRATORY PANEL BY PCR
Adenovirus: NOT DETECTED
BORDETELLA PERTUSSIS-RVPCR: NOT DETECTED
CORONAVIRUS 229E-RVPPCR: NOT DETECTED
CORONAVIRUS OC43-RVPPCR: NOT DETECTED
Chlamydophila pneumoniae: NOT DETECTED
Coronavirus HKU1: NOT DETECTED
Coronavirus NL63: NOT DETECTED
INFLUENZA B-RVPPCR: NOT DETECTED
Influenza A: NOT DETECTED
MYCOPLASMA PNEUMONIAE-RVPPCR: NOT DETECTED
Metapneumovirus: NOT DETECTED
PARAINFLUENZA VIRUS 1-RVPPCR: NOT DETECTED
Parainfluenza Virus 2: NOT DETECTED
Parainfluenza Virus 3: NOT DETECTED
Parainfluenza Virus 4: NOT DETECTED
RESPIRATORY SYNCYTIAL VIRUS-RVPPCR: NOT DETECTED
Rhinovirus / Enterovirus: NOT DETECTED

## 2017-01-08 LAB — EXPECTORATED SPUTUM ASSESSMENT W GRAM STAIN, RFLX TO RESP C

## 2017-01-08 LAB — TROPONIN I
Troponin I: 0.08 ng/mL (ref <0.03)
Troponin I: <0.03 ng/mL (ref <0.03)

## 2017-01-08 LAB — EXPECTORATED SPUTUM ASSESSMENT W REFEX TO RESP CULTURE

## 2017-01-08 LAB — STREP PNEUMONIAE URINARY ANTIGEN: STREP PNEUMO URINARY ANTIGEN: NEGATIVE

## 2017-01-08 LAB — HIV ANTIBODY (ROUTINE TESTING W REFLEX): HIV Screen 4th Generation wRfx: NONREACTIVE

## 2017-01-08 MED ORDER — LEVALBUTEROL HCL 1.25 MG/0.5ML IN NEBU
1.2500 mg | INHALATION_SOLUTION | Freq: Four times a day (QID) | RESPIRATORY_TRACT | Status: DC | PRN
  Administered 2017-01-09: 1.25 mg via RESPIRATORY_TRACT
  Filled 2017-01-08: qty 0.5

## 2017-01-08 MED ORDER — LIDOCAINE 5 % EX PTCH
1.0000 | MEDICATED_PATCH | CUTANEOUS | Status: DC
  Administered 2017-01-08 – 2017-01-12 (×4): 1 via TRANSDERMAL
  Filled 2017-01-08 (×5): qty 1

## 2017-01-08 MED ORDER — SODIUM CHLORIDE 0.9 % IV SOLN
3.0000 g | Freq: Four times a day (QID) | INTRAVENOUS | Status: DC
  Administered 2017-01-08 – 2017-01-13 (×21): 3 g via INTRAVENOUS
  Filled 2017-01-08 (×24): qty 3

## 2017-01-08 MED ORDER — KETOROLAC TROMETHAMINE 30 MG/ML IJ SOLN
30.0000 mg | Freq: Once | INTRAMUSCULAR | Status: AC
  Administered 2017-01-08: 30 mg via INTRAVENOUS
  Filled 2017-01-08: qty 1

## 2017-01-08 MED ORDER — DM-GUAIFENESIN ER 30-600 MG PO TB12
1.0000 | ORAL_TABLET | Freq: Two times a day (BID) | ORAL | Status: DC
  Administered 2017-01-08 – 2017-01-13 (×10): 1 via ORAL
  Filled 2017-01-08 (×11): qty 1

## 2017-01-08 MED ORDER — ASPIRIN EC 325 MG PO TBEC
325.0000 mg | DELAYED_RELEASE_TABLET | Freq: Every day | ORAL | Status: DC
  Administered 2017-01-08 – 2017-01-12 (×5): 325 mg via ORAL
  Filled 2017-01-08 (×5): qty 1

## 2017-01-08 MED ORDER — DM-GUAIFENESIN ER 30-600 MG PO TB12: 1.0000 | ORAL_TABLET | Freq: Two times a day (BID) | ORAL | Status: DC

## 2017-01-08 NOTE — Progress Notes (Signed)
Patients HR is sustaining in the 130's while patient is sitting on the edge of the bed eating. Patient currently has no complaints. MD notified. MD stated to continue to monitor patient. No furthers orders. Will continue to monitor.

## 2017-01-08 NOTE — Progress Notes (Signed)
Received call from lab with notification of troponin level of 0.08.  MD notified

## 2017-01-08 NOTE — Progress Notes (Signed)
Resp. Panel is negative. MD notified. Orders placed. Will continue to monitor.

## 2017-01-08 NOTE — Progress Notes (Signed)
Pharmacy Antibiotic Note  Caleb Avila is a 56 y.o. male admitted on 01/07/2017 with pneumonia.  Pharmacy has been consulted for Unasyn dosing.  Plan: Unasyn 3g IV q6h Follow c/s, clinical progression, renal function, LOT  Height: 5\' 7"  (170.2 cm) Weight: 179 lb 9.6 oz (81.5 kg) IBW/kg (Calculated) : 66.1  Temp (24hrs), Avg:99.1 F (37.3 C), Min:98.7 F (37.1 C), Max:99.6 F (37.6 C)   Recent Labs Lab 01/07/17 1805 01/07/17 1812 01/07/17 2044 01/07/17 2229  WBC 17.1*  --   --   --   CREATININE 1.16 1.00  --   --   LATICACIDVEN  --   --  0.9 1.3    Estimated Creatinine Clearance: 87.4 mL/min (by C-G formula based on SCr of 1 mg/dL).    No Known Allergies  Antimicrobials this admission: CTX 7/23 x1 Doxy 7/23>>7/24 Unasyn 7/24>>  Dose adjustments this admission: n/a  Microbiology results: 7/23 BCx: 7/24 resp panel: 7/24 sputum: not acceptable for testing  Thank you for allowing pharmacy to be a part of this patient's care.  Burnice Vassel D. Yomira Flitton, PharmD, BCPS Clinical Pharmacist Pager: [phone] Clinical Phone for 01/08/2017 until 3:30pm: [phone] If after 3:30pm, please call main pharmacy at [phone] 01/08/2017 11:24 AM

## 2017-01-08 NOTE — Progress Notes (Signed)
[facility] Progress Note  Patient: CASSELL VOORHIES NFA:[id number]   PCP: Glori Luis, MD DOB: 1960/10/16   DOA: 01/07/2017   DOS: 01/08/2017   Date of Service: the patient was seen and examined on 01/08/2017  Subjective: Continues to have left-sided chest pain. Cough is present, now dry. Left-sided chest pain is pleuritic in nature and worsened with breathing as well as movement. No nausea no vomiting. No trouble breathing.  Brief hospital course: Pt. with PMH of hypertension, depression; admitted on 01/07/2017, presented with complaint of chest pain, was found to have left lobar pneumonia. Currently further plan is continue IV antibiotics.  Assessment and Plan: 1. Left lobar pneumonia. Involvement of left lingula as well as lower lobe and right lower lobe.  Strep antigen negative, respiratory virus panel negative. Sputum culture currently unavailable. Continue IV antibiotics, initially started on ceftriaxone and azithromycin, later on changed to ceftriaxone and doxycycline. Will switch him to Unasyn. Follow up on blood cultures. Incentive spirometry as well as flutter device. When necessary nebs. If does not improve may require steroids. Percocet has been ordered for pain control which I would continue. Continue gentle IV hydration at present.  2. Left-sided chest pain. Pleuritic in nature. Left bundle branch block Mild elevation of serum troponin 0.081. EKG shows left bundle branch block, no prior EKG to compare. but the patient never have any symptoms of angina. Other than 1 elevation, serial troponins have remained unremarkable. Follow up on echocardiogram. 325 mg aspirin added. If any abnormality on the echocardiogram patient will require inpatient cardiology consult otherwise outpatient cardiology follow-up.  3. Essential hypertension. Blood pressure stable. Continuing gentle IV hydration at present. Holding amlodipine at present.  Diet: Cardiac diet DVT  Prophylaxis: subcutaneous Heparin  Advance goals of care discussion: full code  Family Communication: no family was present at bedside, at the time of interview.   Disposition:  Discharge to home.  Consultants: none Procedures: none  Antibiotics: Anti-infectives    Start     Dose/Rate Route Frequency Ordered Stop   01/08/17 1100  Ampicillin-Sulbactam (UNASYN) 3 g in sodium chloride 0.9 % 100 mL IVPB     3 g 200 mL/hr over 30 Minutes Intravenous Every 6 hours 01/08/17 1010     01/07/17 2100  doxycycline (VIBRAMYCIN) 100 mg in dextrose 5 % 250 mL IVPB  Status:  Discontinued     100 mg 125 mL/hr over 120 Minutes Intravenous Every 12 hours 01/07/17 2016 01/08/17 1009   01/07/17 2000  cefTRIAXone (ROCEPHIN) 1 g in dextrose 5 % 50 mL IVPB  Status:  Discontinued     1 g 100 mL/hr over 30 Minutes Intravenous Every 24 hours 01/07/17 1947 01/08/17 1009   01/07/17 2000  azithromycin (ZITHROMAX) 500 mg in dextrose 5 % 250 mL IVPB  Status:  Discontinued     500 mg 250 mL/hr over 60 Minutes Intravenous Every 24 hours 01/07/17 1947 01/07/17 2015   01/07/17 1930  cefTRIAXone (ROCEPHIN) 1 g in dextrose 5 % 50 mL IVPB  Status:  Discontinued     1 g 100 mL/hr over 30 Minutes Intravenous  Once 01/07/17 1919 01/07/17 1950   01/07/17 1930  azithromycin (ZITHROMAX) 500 mg in dextrose 5 % 250 mL IVPB  Status:  Discontinued     500 mg 250 mL/hr over 60 Minutes Intravenous  Once 01/07/17 1919 01/07/17 1951       Objective: Physical Exam: Vitals:   01/07/17 2209 01/08/17 0457 01/08/17 0940 01/08/17 1013  BP: 140/88 [name])  142/94 130/84 130/84  Pulse: (!) 102 100  100  Resp: (!) 36 (!) 32  20  Temp: 98.7 F (37.1 C) 99.1 F (37.3 C)  98.8 F (37.1 C)  TempSrc: Oral Oral  Oral  SpO2: 96% 98%  95%  Weight: 81.5 kg (179 lb 9.6 oz)     Height: 5\' 7"  (1.702 m)       Intake/Output Summary (Last 24 hours) at 01/08/17 1612 Last data filed at 01/08/17 1456  Gross per 24 hour  Intake             2325  ml  Output             1350 ml  Net              975 ml   Filed Weights   01/07/17 1750 01/07/17 2209  Weight: 85.7 kg (189 lb) 81.5 kg (179 lb 9.6 oz)   General: Alert, Awake and Oriented to Time, Place and Person. Appear in moderate distress, affect appropriate Eyes: PERRL, Conjunctiva normal ENT: Oral Mucosa clear moist. Neck: no JVD, no Abnormal Mass Or lumps Cardiovascular: S1 and S2 Present, no Murmur, Peripheral Pulses Present Respiratory: increased respiratory effort, Bilateral Air entry equal and Decreased, no use of accessory muscle, bilateral basal crackles, no wheezes Abdomen: Bowel Sound present, Soft and no tenderness, no hernia Skin: no redness, no Rash, no induration Extremities: no Pedal edema, no calf tenderness Neurologic: Grossly no focal neuro deficit. Bilaterally Equal motor strength  Data Reviewed: CBC:  Recent Labs Lab 01/07/17 1805 01/07/17 1812  WBC 17.1*  --   HGB 13.8 14.6  HCT 40.3 43.0  MCV 86.1  --   PLT 211  --    Basic Metabolic Panel:  Recent Labs Lab 01/07/17 1805 01/07/17 1812  NA 136 141  K 3.8 3.8  CL 105 103  CO2 22  --   GLUCOSE 140* 141*  BUN 19 23*  CREATININE 1.16 1.00  CALCIUM 8.8*  --     Liver Function Tests: No results for input(s): AST, ALT, ALKPHOS, BILITOT, PROT, ALBUMIN in the last 168 hours. No results for input(s): LIPASE, AMYLASE in the last 168 hours. No results for input(s): AMMONIA in the last 168 hours. Coagulation Profile: No results for input(s): INR, PROTIME in the last 168 hours. Cardiac Enzymes:  Recent Labs Lab 01/07/17 2044 01/08/17 0224 01/08/17 0725  TROPONINI <0.03 0.08* <0.03   BNP (last 3 results) No results for input(s): PROBNP in the last 8760 hours. CBG: No results for input(s): GLUCAP in the last 168 hours. Studies: Dg Chest 2 View  Result Date: 01/07/2017 CLINICAL DATA:  Acute onset of left flank pain and left-sided chest pain after sneezing yesterday. The pain was severe  when moving from a kneeling position to a standing position. No discrete injury. EXAM: CHEST  2 VIEW COMPARISON:  None. FINDINGS: Cardiac silhouette upper normal in size. Hilar and mediastinal contours unremarkable. Consolidation in the left lower lobe associated with volume loss and elevation of the left hemidiaphragm. Linear atelectasis in the lingula. Lungs otherwise clear. Pulmonary vascularity normal. No convincing pleural effusions. Degenerative changes involving the lower thoracic and upper lumbar spine. IMPRESSION: Dense left lower lobe atelectasis (favored over pneumonia) and mild atelectasis involving the lingula. Electronically Signed   By: Hulan Saas M.D.   On: 01/07/2017 18:29   Ct Angio Chest Pe W/cm &/or Wo Cm  Result Date: 01/07/2017 CLINICAL DATA:  Left costal pain EXAM: CT ANGIOGRAPHY  CHEST WITH CONTRAST TECHNIQUE: Multidetector CT imaging of the chest was performed using the standard protocol during bolus administration of intravenous contrast. Multiplanar CT image reconstructions and MIPs were obtained to evaluate the vascular anatomy. CONTRAST:  100 cc Isovue 370 intravenous COMPARISON:  Radiograph 01/07/2017 FINDINGS: Cardiovascular: Satisfactory opacification of the pulmonary arteries to the segmental level. No evidence of pulmonary embolism. Non aneurysmal aorta. No dissection is seen. Heart size within normal limits. No large pericardial effusion. Mediastinum/Nodes: Midline trachea. Esophagus within normal limits. No significantly enlarged mediastinal lymph nodes. No thyroid mass. Lungs/Pleura: Small left-sided pleural effusion. Partial consolidation in the lingula and left lower lobe with streaky density in the right lower lobe. No pneumothorax. Upper Abdomen: No acute abnormality. Musculoskeletal: No chest wall abnormality. No acute or significant osseous findings. Review of the MIP images confirms the above findings. IMPRESSION: 1. Negative for acute pulmonary embolus or aortic  dissection 2. Partial consolidations in the lingula, left lower lobe and right lower lobe suspicious for multifocal pneumonia. Small left-sided pleural effusion. Electronically Signed   By: Jasmine PangKim  Fujinaga M.D.   On: 01/07/2017 19:01    Scheduled Meds: . enoxaparin (LOVENOX) injection  40 mg Subcutaneous Q24H  . multivitamin with minerals  1 tablet Oral Daily   Continuous Infusions: . sodium chloride 100 mL/hr at 01/08/17 0805  . ampicillin-sulbactam (UNASYN) IV Stopped (01/08/17 1253)   PRN Meds: acetaminophen, dextromethorphan-guaiFENesin, hydrALAZINE, hydrOXYzine, levalbuterol, oxyCODONE-acetaminophen, zolpidem  Time spent: 35 minutes  Author: Lynden OxfordPranav Naamah Boggess, MD [facility] Pager: [phone] 01/08/2017 4:12 PM  If 7PM-7AM, please contact night-coverage at [email or web address], password [email or web address]

## 2017-01-09 ENCOUNTER — Inpatient Hospital Stay (HOSPITAL_COMMUNITY): Payer: BLUE CROSS/BLUE SHIELD

## 2017-01-09 DIAGNOSIS — R0781 Pleurodynia: Secondary | ICD-10-CM

## 2017-01-09 DIAGNOSIS — R0600 Dyspnea, unspecified: Secondary | ICD-10-CM

## 2017-01-09 DIAGNOSIS — R748 Abnormal levels of other serum enzymes: Secondary | ICD-10-CM

## 2017-01-09 LAB — CBC WITH DIFFERENTIAL/PLATELET
BASOS ABS: 0 10*3/uL (ref 0.0–0.1)
BASOS PCT: 0 %
Eosinophils Absolute: 0.2 10*3/uL (ref 0.0–0.7)
Eosinophils Relative: 1 %
HEMATOCRIT: 42.2 % (ref 39.0–52.0)
Hemoglobin: 14.1 g/dL (ref 13.0–17.0)
LYMPHS PCT: 14 %
Lymphs Abs: 2.3 10*3/uL (ref 0.7–4.0)
MCH: 29.3 pg (ref 26.0–34.0)
MCHC: 33.4 g/dL (ref 30.0–36.0)
MCV: 87.6 fL (ref 78.0–100.0)
MONO ABS: 1.4 10*3/uL — AB (ref 0.1–1.0)
Monocytes Relative: 8 %
NEUTROS ABS: 13.1 10*3/uL — AB (ref 1.7–7.7)
Neutrophils Relative %: 77 %
PLATELETS: 241 10*3/uL (ref 150–400)
RBC: 4.82 MIL/uL (ref 4.22–5.81)
RDW: 13.3 % (ref 11.5–15.5)
WBC: 17 10*3/uL — AB (ref 4.0–10.5)

## 2017-01-09 LAB — COMPREHENSIVE METABOLIC PANEL
ALK PHOS: 76 U/L (ref 38–126)
ALT: 23 U/L (ref 17–63)
ANION GAP: 10 (ref 5–15)
AST: 23 U/L (ref 15–41)
Albumin: 2.7 g/dL — ABNORMAL LOW (ref 3.5–5.0)
BUN: 12 mg/dL (ref 6–20)
CALCIUM: 8.5 mg/dL — AB (ref 8.9–10.3)
CO2: 23 mmol/L (ref 22–32)
Chloride: 107 mmol/L (ref 101–111)
Creatinine, Ser: 1.19 mg/dL (ref 0.61–1.24)
Glucose, Bld: 112 mg/dL — ABNORMAL HIGH (ref 65–99)
POTASSIUM: 3.6 mmol/L (ref 3.5–5.1)
Sodium: 140 mmol/L (ref 135–145)
TOTAL PROTEIN: 6.7 g/dL (ref 6.5–8.1)
Total Bilirubin: 1 mg/dL (ref 0.3–1.2)

## 2017-01-09 LAB — ECHOCARDIOGRAM COMPLETE
HEIGHTINCHES: 67 in
WEIGHTICAEL: 2895.96 [oz_av]

## 2017-01-09 LAB — TROPONIN I: TROPONIN I: 0.05 ng/mL — AB (ref <0.03)

## 2017-01-09 LAB — MAGNESIUM: Magnesium: 1.8 mg/dL (ref 1.7–2.4)

## 2017-01-09 LAB — LEGIONELLA PNEUMOPHILA SEROGP 1 UR AG: L. PNEUMOPHILA SEROGP 1 UR AG: NEGATIVE

## 2017-01-09 NOTE — Progress Notes (Addendum)
CRITICAL VALUE ALERT  Critical Value:  Troponin 0.05  Date & Time Notied:  01/09/17 0800  Provider Notified: yes  Orders Received/Actions taken: continue to monitor

## 2017-01-09 NOTE — Progress Notes (Signed)
Temp 103, BP 179/100, HR 139, O2 90% on 2 liters of oxygen. Will give Tylenol, Hydralazine, & breathing treatment per MD order.  MD notified.

## 2017-01-09 NOTE — Progress Notes (Signed)
[facility] PROGRESS NOTE  Charlott Rakesrenton L Henne [id number] DOB: 06/17/1961 DOA: 01/07/2017 PCP: Glori LuisSonnenberg, [name], MD  Interim summary and HPI Pt. with PMH of hypertension, depression; admitted on 01/07/2017, presented with complaint of chest pain, was found to have left lobar pneumonia.  Assessment/Plan: 1-left multifocal PNA:  -neg resp viral panel -no fever -even still SOB, no febrile and endorses breathing better -will continue IV antibiotics; currently Unasyn as other antibiotics can cause prolongation of QT -continue PRN duoneb and start IS  2-prolonged QT -no findings seen on Echo that will explained -no prior EKG for comparison -will minimize drugs that can cause further prolongation of QT  3-chest pain/mild troponin elevation  -pleuritic in origin -most likely associated with PNA -will continue IV antibiotics -Echo reassuring: no wall motion abnormalities and preserved EF -no abnormalities seen on telemetry  -rest of troponin neg  4-essential HTN -BP is stable -continue heart healthy diet -will resume antihypertensive agents at discharge  Code Status: Full Family Communication: no family at bedside Disposition Plan: remain inpatient, continue IV antibiotics, follow cultures, start incentive spirometry.   Consultants:  None   Procedures:  See below for x-ray reports .  2-D echo - Left ventricle: The cavity size was normal. There was mild focal   basal hypertrophy of the septum. Systolic function was normal.   The estimated ejection fraction was in the range of 60% to 65%.   Wall motion was normal; there were no regional wall motion   abnormalities. - Ventricular septum: Septal motion showed severe paradox. These   changes are consistent with intraventricular conduction delay. - Aortic valve: Trileaflet; normal thickness, mildly calcified   leaflets. - Mitral valve: There was trivial regurgitation. - Atrial septum: There was increased thickness  of the septum,   consistent with lipomatous hypertrophy.  Antibiotics:  unasyn 7/24  HPI/Subjective: Afebrile, still SOB and feeling winded with activity. Positive coughing spells during interview. No CP, no nausea, no vomiting.  Objective: Vitals:   01/09/17 0459 01/09/17 0911  BP: 137/85 (!) 149/91  Pulse: (!) 103 (!) 114  Resp: 18 18  Temp: 98.6 F (37 C) 98.9 F (37.2 C)    Intake/Output Summary (Last 24 hours) at 01/09/17 1258 Last data filed at 01/09/17 1144  Gross per 24 hour  Intake          2063.33 ml  Output             2700 ml  Net          -636.67 ml   Filed Weights   01/07/17 1750 01/07/17 2209 01/08/17 2105  Weight: 85.7 kg (189 lb) 81.5 kg (179 lb 9.6 oz) 82.1 kg (181 lb)    Exam:   General: afebrile, still mild SOB and coughing; no nausea, no vomiting and no abd pain.  Cardiovascular: S1 and S2, no rubs, no gallops, no JVD.   Respiratory: good air movement, no wheezing, positive rhonchi  (L > R); increase tachypnea with exertion.  Abdomen: soft, NT, ND, positive BS  Musculoskeletal: trace edema bilaterally, no cyanosis or clubbing   Data Reviewed: Basic Metabolic Panel:  Recent Labs Lab 01/07/17 1805 01/07/17 1812 01/09/17 0623  NA 136 141 140  K 3.8 3.8 3.6  CL 105 103 107  CO2 22  --  23  GLUCOSE 140* 141* 112*  BUN 19 23* 12  CREATININE 1.16 1.00 1.19  CALCIUM 8.8*  --  8.5*  MG  --   --  1.8  Liver Function Tests:  Recent Labs Lab 01/09/17 0623  AST 23  ALT 23  ALKPHOS 76  BILITOT 1.0  PROT 6.7  ALBUMIN 2.7*   CBC:  Recent Labs Lab 01/07/17 1805 01/07/17 1812 01/09/17 0623  WBC 17.1*  --  17.0*  NEUTROABS  --   --  13.1*  HGB 13.8 14.6 14.1  HCT 40.3 43.0 42.2  MCV 86.1  --  87.6  PLT 211  --  241   Cardiac Enzymes:  Recent Labs Lab 01/07/17 2044 01/08/17 0224 01/08/17 0725 01/09/17 0623  TROPONINI <0.03 0.08* <0.03 0.05*   CBG: No results for input(s): GLUCAP in the last 168 hours.  Recent  Results (from the past 240 hour(s))  Culture, blood (x 2)     Status: None (Preliminary result)   Collection Time: 01/07/17  8:40 PM  Result Value Ref Range Status   Specimen Description BLOOD RIGHT ANTECUBITAL  Final   Special Requests   Final    BOTTLES DRAWN AEROBIC AND ANAEROBIC Blood Culture adequate volume   Culture NO GROWTH 2 DAYS  Final   Report Status PENDING  Incomplete  Culture, blood (x 2)     Status: None (Preliminary result)   Collection Time: 01/07/17  8:45 PM  Result Value Ref Range Status   Specimen Description BLOOD RIGHT HAND  Final   Special Requests   Final    BOTTLES DRAWN AEROBIC AND ANAEROBIC Blood Culture adequate volume   Culture NO GROWTH 2 DAYS  Final   Report Status PENDING  Incomplete  Culture, sputum-assessment     Status: None   Collection Time: 01/08/17  6:15 AM  Result Value Ref Range Status   Specimen Description SPUTUM  Final   Special Requests NONE  Final   Sputum evaluation   Final    Sputum specimen not acceptable for testing.  Please recollect.   Gram Stain Report Called to,Read Back By and Verified With: A. MABE RN, AT [phone] 01/08/17 BY D. Leighton Roach    Report Status 01/08/2017 FINAL  Final  Respiratory Panel by PCR     Status: None   Collection Time: 01/08/17  9:53 AM  Result Value Ref Range Status   Adenovirus NOT DETECTED NOT DETECTED Final   Coronavirus 229E NOT DETECTED NOT DETECTED Final   Coronavirus HKU1 NOT DETECTED NOT DETECTED Final   Coronavirus NL63 NOT DETECTED NOT DETECTED Final   Coronavirus OC43 NOT DETECTED NOT DETECTED Final   Metapneumovirus NOT DETECTED NOT DETECTED Final   Rhinovirus / Enterovirus NOT DETECTED NOT DETECTED Final   Influenza A NOT DETECTED NOT DETECTED Final   Influenza B NOT DETECTED NOT DETECTED Final   Parainfluenza Virus 1 NOT DETECTED NOT DETECTED Final   Parainfluenza Virus 2 NOT DETECTED NOT DETECTED Final   Parainfluenza Virus 3 NOT DETECTED NOT DETECTED Final   Parainfluenza Virus 4 NOT  DETECTED NOT DETECTED Final   Respiratory Syncytial Virus NOT DETECTED NOT DETECTED Final   Bordetella pertussis NOT DETECTED NOT DETECTED Final   Chlamydophila pneumoniae NOT DETECTED NOT DETECTED Final   Mycoplasma pneumoniae NOT DETECTED NOT DETECTED Final     Studies: Dg Chest 2 View  Result Date: 01/07/2017 CLINICAL DATA:  Acute onset of left flank pain and left-sided chest pain after sneezing yesterday. The pain was severe when moving from a kneeling position to a standing position. No discrete injury. EXAM: CHEST  2 VIEW COMPARISON:  None. FINDINGS: Cardiac silhouette upper normal in size. Hilar and mediastinal contours  unremarkable. Consolidation in the left lower lobe associated with volume loss and elevation of the left hemidiaphragm. Linear atelectasis in the lingula. Lungs otherwise clear. Pulmonary vascularity normal. No convincing pleural effusions. Degenerative changes involving the lower thoracic and upper lumbar spine. IMPRESSION: Dense left lower lobe atelectasis (favored over pneumonia) and mild atelectasis involving the lingula. Electronically Signed   By: Hulan Saashomas  Lawrence M.D.   On: 01/07/2017 18:29   Ct Angio Chest Pe W/cm &/or Wo Cm  Result Date: 01/07/2017 CLINICAL DATA:  Left costal pain EXAM: CT ANGIOGRAPHY CHEST WITH CONTRAST TECHNIQUE: Multidetector CT imaging of the chest was performed using the standard protocol during bolus administration of intravenous contrast. Multiplanar CT image reconstructions and MIPs were obtained to evaluate the vascular anatomy. CONTRAST:  100 cc Isovue 370 intravenous COMPARISON:  Radiograph 01/07/2017 FINDINGS: Cardiovascular: Satisfactory opacification of the pulmonary arteries to the segmental level. No evidence of pulmonary embolism. Non aneurysmal aorta. No dissection is seen. Heart size within normal limits. No large pericardial effusion. Mediastinum/Nodes: Midline trachea. Esophagus within normal limits. No significantly enlarged  mediastinal lymph nodes. No thyroid mass. Lungs/Pleura: Small left-sided pleural effusion. Partial consolidation in the lingula and left lower lobe with streaky density in the right lower lobe. No pneumothorax. Upper Abdomen: No acute abnormality. Musculoskeletal: No chest wall abnormality. No acute or significant osseous findings. Review of the MIP images confirms the above findings. IMPRESSION: 1. Negative for acute pulmonary embolus or aortic dissection 2. Partial consolidations in the lingula, left lower lobe and right lower lobe suspicious for multifocal pneumonia. Small left-sided pleural effusion. Electronically Signed   By: Jasmine PangKim  Fujinaga M.D.   On: 01/07/2017 19:01    Scheduled Meds: . aspirin EC  325 mg Oral Daily  . dextromethorphan-guaiFENesin  1 tablet Oral BID  . enoxaparin (LOVENOX) injection  40 mg Subcutaneous Q24H  . lidocaine  1 patch Transdermal Q24H  . multivitamin with minerals  1 tablet Oral Daily   Continuous Infusions: . sodium chloride Stopped (01/09/17 1141)  . ampicillin-sulbactam (UNASYN) IV Stopped (01/09/17 1039)    Principal Problem:   CAP (community acquired pneumonia) Active Problems:   Essential hypertension   Sepsis (HCC)   Multifocal pneumonia   Acute respiratory failure with hypoxia (HCC)    Time spent: 30 minutes    Vassie LollMadera, Domnique Vanegas  [facility] Pager [phone]. If 7PM-7AM, please contact night-coverage at [email or web address], password [email or web address] 01/09/2017, 12:58 PM  LOS: 2 days

## 2017-01-09 NOTE — Progress Notes (Signed)
Incentive spirometry given to patient with instruction on use.  Patient returned demonstration.

## 2017-01-09 NOTE — Progress Notes (Signed)
  Echocardiogram 2D Echocardiogram has been performed.  Arvil ChacoFoster, Lacrystal Barbe 01/09/2017, 9:37 AM

## 2017-01-10 NOTE — Progress Notes (Signed)
[facility] PROGRESS NOTE  Charlott Rakesrenton L Apollo [id number] DOB: 12/27/1960 DOA: 01/07/2017 PCP: Glori LuisSonnenberg, [name], MD  Interim summary and HPI Pt. with PMH of hypertension, depression; admitted on 01/07/2017, presented with complaint of chest pain, was found to have left lobar pneumonia.  Assessment/Plan: 1-left multifocal PNA:  -neg resp viral panel -even still SOB, feeling better. -patient spiked fever overnight and require oxygen supplementation  -will continue IV antibiotics; currently Unasyn as other antibiotics can cause prolongation of QT -continue PRN duoneb and incentive spirometry use -will repeat CXR in am.  2-prolonged QT -no findings seen on Echo that will explained abnormality  -no prior EKG for comparison -will continue minimizing drugs that can cause further prolongation of QT  3-chest pain/mild troponin elevation  -pleuritic in origin -most likely associated with PNA -Echo reassuring: no wall motion abnormalities and preserved EF -no abnormalities seen on telemetry or EKG to suggest ischemic component  -rest of his troponin neg -will monitor and continue treatment for PNA  4-essential HTN -BP is stable overall -will continue continue heart healthy diet -will resume antihypertensive agents at discharge -monitoring VS off meds for now  Code Status: Full Family Communication: no family at bedside Disposition Plan: remain inpatient, continue IV antibiotics, follow cultures, continue incentive spirometry.   Consultants:  None   Procedures:  See below for x-ray reports .  2-D echo - Left ventricle: The cavity size was normal. There was mild focal   basal hypertrophy of the septum. Systolic function was normal.   The estimated ejection fraction was in the range of 60% to 65%.   Wall motion was normal; there were no regional wall motion   abnormalities. - Ventricular septum: Septal motion showed severe paradox. These   changes are consistent with  intraventricular conduction delay. - Aortic valve: Trileaflet; normal thickness, mildly calcified   leaflets. - Mitral valve: There was trivial regurgitation. - Atrial septum: There was increased thickness of the septum,   consistent with lipomatous hypertrophy.  Antibiotics:  unasyn 7/24  HPI/Subjective: Patient spiked fever overnight and is now requiring oxygen supplementation. Also complaining of pleuritic chest discomfort with coughing spells.  Objective: Vitals:   01/10/17 0513 01/10/17 0920  BP: (!) 148/96 (!) 147/100  Pulse: (!) 110 (!) 113  Resp: 20 18  Temp: 99.8 F (37.7 C) 99.8 F (37.7 C)    Intake/Output Summary (Last 24 hours) at 01/10/17 1054 Last data filed at 01/10/17 0920  Gross per 24 hour  Intake          2746.66 ml  Output             1925 ml  Net           821.66 ml   Filed Weights   01/07/17 2209 01/08/17 2105 01/09/17 2120  Weight: 81.5 kg (179 lb 9.6 oz) 82.1 kg (181 lb) 83.2 kg (183 lb 6.8 oz)    Exam:   General: patient has spiked fever and is requiring oxygen supplementation. Also reports pleuritic chest pain. No nausea, no vomiting, no abd pain.  Cardiovascular: S1 and S2, no rubs, no gallops, no JVD  Respiratory: scattered rhonchi, fair air movement, no wheezing, no crackles. Normal resp effort  Abdomen: soft, NT, ND, positive BS  Musculoskeletal: trace edema bilaterally, no cyanosis    Data Reviewed: Basic Metabolic Panel:  Recent Labs Lab 01/07/17 1805 01/07/17 1812 01/09/17 0623  NA 136 141 140  K 3.8 3.8 3.6  CL 105 103 107  CO2 22  --  23  GLUCOSE 140* 141* 112*  BUN 19 23* 12  CREATININE 1.16 1.00 1.19  CALCIUM 8.8*  --  8.5*  MG  --   --  1.8   Liver Function Tests:  Recent Labs Lab 01/09/17 0623  AST 23  ALT 23  ALKPHOS 76  BILITOT 1.0  PROT 6.7  ALBUMIN 2.7*   CBC:  Recent Labs Lab 01/07/17 1805 01/07/17 1812 01/09/17 0623  WBC 17.1*  --  17.0*  NEUTROABS  --   --  13.1*  HGB 13.8 14.6 14.1   HCT 40.3 43.0 42.2  MCV 86.1  --  87.6  PLT 211  --  241   Cardiac Enzymes:  Recent Labs Lab 01/07/17 2044 01/08/17 0224 01/08/17 0725 01/09/17 0623  TROPONINI <0.03 0.08* <0.03 0.05*   CBG: No results for input(s): GLUCAP in the last 168 hours.  Recent Results (from the past 240 hour(s))  Culture, blood (x 2)     Status: None (Preliminary result)   Collection Time: 01/07/17  8:40 PM  Result Value Ref Range Status   Specimen Description BLOOD RIGHT ANTECUBITAL  Final   Special Requests   Final    BOTTLES DRAWN AEROBIC AND ANAEROBIC Blood Culture adequate volume   Culture NO GROWTH 2 DAYS  Final   Report Status PENDING  Incomplete  Culture, blood (x 2)     Status: None (Preliminary result)   Collection Time: 01/07/17  8:45 PM  Result Value Ref Range Status   Specimen Description BLOOD RIGHT HAND  Final   Special Requests   Final    BOTTLES DRAWN AEROBIC AND ANAEROBIC Blood Culture adequate volume   Culture NO GROWTH 2 DAYS  Final   Report Status PENDING  Incomplete  Culture, sputum-assessment     Status: None   Collection Time: 01/08/17  6:15 AM  Result Value Ref Range Status   Specimen Description SPUTUM  Final   Special Requests NONE  Final   Sputum evaluation   Final    Sputum specimen not acceptable for testing.  Please recollect.   Gram Stain Report Called to,Read Back By and Verified With: A. MABE RN, AT [phone] 01/08/17 BY D. Leighton Roach    Report Status 01/08/2017 FINAL  Final  Respiratory Panel by PCR     Status: None   Collection Time: 01/08/17  9:53 AM  Result Value Ref Range Status   Adenovirus NOT DETECTED NOT DETECTED Final   Coronavirus 229E NOT DETECTED NOT DETECTED Final   Coronavirus HKU1 NOT DETECTED NOT DETECTED Final   Coronavirus NL63 NOT DETECTED NOT DETECTED Final   Coronavirus OC43 NOT DETECTED NOT DETECTED Final   Metapneumovirus NOT DETECTED NOT DETECTED Final   Rhinovirus / Enterovirus NOT DETECTED NOT DETECTED Final   Influenza A NOT  DETECTED NOT DETECTED Final   Influenza B NOT DETECTED NOT DETECTED Final   Parainfluenza Virus 1 NOT DETECTED NOT DETECTED Final   Parainfluenza Virus 2 NOT DETECTED NOT DETECTED Final   Parainfluenza Virus 3 NOT DETECTED NOT DETECTED Final   Parainfluenza Virus 4 NOT DETECTED NOT DETECTED Final   Respiratory Syncytial Virus NOT DETECTED NOT DETECTED Final   Bordetella pertussis NOT DETECTED NOT DETECTED Final   Chlamydophila pneumoniae NOT DETECTED NOT DETECTED Final   Mycoplasma pneumoniae NOT DETECTED NOT DETECTED Final     Studies: No results found.  Scheduled Meds: . aspirin EC  325 mg Oral Daily  . dextromethorphan-guaiFENesin  1 tablet Oral BID  . enoxaparin (LOVENOX)  injection  40 mg Subcutaneous Q24H  . lidocaine  1 patch Transdermal Q24H  . multivitamin with minerals  1 tablet Oral Daily   Continuous Infusions: . sodium chloride 100 mL/hr at 01/10/17 1028  . ampicillin-sulbactam (UNASYN) IV 3 g (01/10/17 1028)     Time spent: 30 minutes    Vassie LollMadera, Percell Lamboy  [facility] Pager [phone]. If 7PM-7AM, please contact night-coverage at [email or web address], password [email or web address] 01/10/2017, 10:54 AM  LOS: 3 days

## 2017-01-11 ENCOUNTER — Inpatient Hospital Stay (HOSPITAL_COMMUNITY): Payer: BLUE CROSS/BLUE SHIELD

## 2017-01-11 ENCOUNTER — Encounter: Payer: BLUE CROSS/BLUE SHIELD | Admitting: Family Medicine

## 2017-01-11 DIAGNOSIS — A419 Sepsis, unspecified organism: Principal | ICD-10-CM

## 2017-01-11 DIAGNOSIS — R0602 Shortness of breath: Secondary | ICD-10-CM | POA: Diagnosis present

## 2017-01-11 DIAGNOSIS — J90 Pleural effusion, not elsewhere classified: Secondary | ICD-10-CM

## 2017-01-11 DIAGNOSIS — J181 Lobar pneumonia, unspecified organism: Secondary | ICD-10-CM

## 2017-01-11 DIAGNOSIS — J189 Pneumonia, unspecified organism: Secondary | ICD-10-CM

## 2017-01-11 LAB — CBC
HCT: 40.6 % (ref 39.0–52.0)
Hemoglobin: 13.7 g/dL (ref 13.0–17.0)
MCH: 29 pg (ref 26.0–34.0)
MCHC: 33.7 g/dL (ref 30.0–36.0)
MCV: 86 fL (ref 78.0–100.0)
PLATELETS: 301 10*3/uL (ref 150–400)
RBC: 4.72 MIL/uL (ref 4.22–5.81)
RDW: 13.2 % (ref 11.5–15.5)
WBC: 15.7 10*3/uL — ABNORMAL HIGH (ref 4.0–10.5)

## 2017-01-11 LAB — BASIC METABOLIC PANEL
Anion gap: 13 (ref 5–15)
BUN: 12 mg/dL (ref 6–20)
CO2: 21 mmol/L — ABNORMAL LOW (ref 22–32)
Calcium: 8.4 mg/dL — ABNORMAL LOW (ref 8.9–10.3)
Chloride: 106 mmol/L (ref 101–111)
Creatinine, Ser: 1.06 mg/dL (ref 0.61–1.24)
GFR calc Af Amer: >60 mL/min (ref >60)
GLUCOSE: 100 mg/dL — AB (ref 65–99)
POTASSIUM: 4.2 mmol/L (ref 3.5–5.1)
Sodium: 140 mmol/L (ref 135–145)

## 2017-01-11 LAB — MRSA PCR SCREENING: MRSA by PCR: NEGATIVE

## 2017-01-11 MED ORDER — SODIUM CHLORIDE 0.9 % IV BOLUS (SEPSIS)
500.0000 mL | Freq: Once | INTRAVENOUS | Status: AC
  Administered 2017-01-11: 500 mL via INTRAVENOUS

## 2017-01-11 MED ORDER — DOXYCYCLINE HYCLATE 100 MG IV SOLR
100.0000 mg | Freq: Two times a day (BID) | INTRAVENOUS | Status: DC
  Administered 2017-01-11 – 2017-01-13 (×5): 100 mg via INTRAVENOUS
  Filled 2017-01-11 (×5): qty 100

## 2017-01-11 NOTE — Consult Note (Addendum)
Name: Charlott Rakesrenton L Dubas MRN: [id number] DOB: 09/28/1960    ADMISSION DATE:  01/07/2017 CONSULTATION DATE:  7/27  REFERRING MD :  Vedia PereyraBhadari   CHIEF COMPLAINT:  Worsening Pneumonia   BRIEF PATIENT DESCRIPTION:  This is a 56 year old male admitted w/ working dx of CAP on 7/23. Treated w/ IV unasyn, had 2d of doxy. PCCM asked to see on 7/27 pt still feeling weak, persistent fevers & CXR w/ progression of left sided airspace disease.    SIGNIFICANT EVENTS   Cultures BCX2 7/23:>>> RVP 7/23: negative Legionella and strep AG: negative  Pleural effusion culture 7/25>>>  abx Rocephin 7/23: x 1 dose unasyn 7/24>>> Doxy 7/23-->7/24; resumed 7/27>>>  STUDIES:  7/23 : 1. Negative for acute pulmonary embolus or aortic dissection 2. Partial consolidations in the lingula, left lower lobe and right lower lobe suspicious for multifocal pneumonia. Small left-sided pleural effusion.    HISTORY OF PRESENT ILLNESS:   This is a 56 year old male who was admitted on 7/23 w/ cc: NP dry cough, sob, left sided flank and pleuritic chest pain. Temp was 99.6 WBC 17.1 sats 94%. CXR and CT evaluation of chest showed left sided multifocal PNA & small effusion. He was admitted w/ working dx of CAP and sepsis. Started on unasyn , received 2 days of Doxy. His CBC has been slowly improving, but still spiking temps. CXR obtained on 7/27 showed progressive Left sided airspace disease w/ pleural effusion and possible endobronchial debris. PCCm asked to see for  Worsening PNA.   PAST MEDICAL HISTORY :   has a past medical history of Depression; Frequent headaches; and Hypertension.  has a past surgical history that includes chest wall cyst and Finger surgery (Right). Prior to Admission medications   Medication Sig Start Date End Date Taking? Authorizing Provider  acetaminophen (TYLENOL) 500 MG chewable tablet Chew 1,000 mg by mouth every 6 (six) hours as needed for pain.   Yes [provider]  amLODipine  (NORVASC) 5 MG tablet TAKE 1 TABLET (5 MG TOTAL) BY MOUTH DAILY. 11/22/16  Yes Glori LuisSonnenberg, [name], MD  ibuprofen (ADVIL,MOTRIN) 200 MG tablet Take 400 mg by mouth every 6 (six) hours as needed for mild pain.   Yes [provider]  Multiple Vitamin (MULTIVITAMIN) tablet Take 1 tablet by mouth daily.   Yes [provider]  naproxen sodium (ANAPROX) 220 MG tablet Take 660 mg by mouth daily as needed (pain).   Yes [provider]   No Known Allergies  FAMILY HISTORY:  family history includes Depression in his unknown relative. SOCIAL HISTORY:  reports that he has never smoked. He has never used smokeless tobacco. He reports that he does not drink alcohol or use drugs.  REVIEW OF SYSTEMS:   Constitutional: Negative for fever, chills, weight loss, malaise/fatigue and diaphoresis.  HENT: Negative for hearing loss, ear pain, nosebleeds, congestion, sore throat, neck pain, tinnitus and ear discharge.   Eyes: Negative for blurred vision, double vision, photophobia, pain, discharge and redness.  Respiratory: +cough, no hemoptysis, no sputum production, shortness of breath improved, + CP pleuritic, wheezing and stridor.   Cardiovascular: Negative for chest pain, palpitations, orthopnea, claudication, leg swelling and PND.  Gastrointestinal: Negative for heartburn, nausea, vomiting, abdominal pain, diarrhea, constipation, blood in stool and melena.  Genitourinary: Negative for dysuria, urgency, frequency, hematuria and flank pain.  Musculoskeletal: Negative for myalgias, back pain, joint pain and falls.  Skin: Negative for itching and rash.  Neurological: Negative for dizziness, tingling,  tremors, sensory change, speech change, focal weakness, seizures, loss of consciousness, weakness and headaches.  Endo/Heme/Allergies: Negative for environmental allergies and polydipsia. Does not bruise/bleed easily.  SUBJECTIVE:  Feels better   VITAL SIGNS: Temp:  [98.3 F (36.8 C)-102.4  F (39.1 C)] 98.3 F (36.8 C) (07/27 0739) Pulse Rate:  [93-117] 93 (07/27 0739) Resp:  [18-22] 22 (07/27 0739) BP: (124-159)/(90-100) 124/90 (07/27 0739) SpO2:  [93 %-94 %] 94 % (07/27 0739) Weight:  [180 lb (81.6 kg)] 180 lb (81.6 kg) (07/26 2143)  PHYSICAL EXAMINATION: General appearance:  56 Year old  Male/male, well nourishedNAD, currently in acute distress, conversant  Eyes: anicteric sclerae, moist conjunctivae; PERRL, EOMI bilaterally. Mouth:  membranes and no mucosal ulcerations; normal hard and soft palate Neck: Trachea midline; neck supple, no JVD Lungs/chest: decreased on left, normal respiratory effort and no intercostal retractions CV: RRR, no MRGs  Abdomen: Soft, non-tender; no masses or HSM Extremities: No peripheral edema  Skin: Normal temperature, turgor and texture; no rash, ulcers or subcutaneous nodules Psych: Appropriate affect, alert and oriented to person, place and time    Recent Labs Lab 01/07/17 1805 01/07/17 1812 01/09/17 0623 01/11/17 0358  NA 136 141 140 140  K 3.8 3.8 3.6 4.2  CL 105 103 107 106  CO2 22  --  23 21*  BUN 19 23* 12 12  CREATININE 1.16 1.00 1.19 1.06  GLUCOSE 140* 141* 112* 100*    Recent Labs Lab 01/07/17 1805 01/07/17 1812 01/09/17 0623 01/11/17 0358  HGB 13.8 14.6 14.1 13.7  HCT 40.3 43.0 42.2 40.6  WBC 17.1*  --  17.0* 15.7*  PLT 211  --  241 301   Dg Chest 2 View  Result Date: 01/11/2017 CLINICAL DATA:  56 year old male with a history of chest pain. Known prior lobar pneumonia 01/07/2017 EXAM: CHEST  2 VIEW COMPARISON:  CT chest 01/07/2017, plain film 01/07/2017 FINDINGS: Cardiomediastinal silhouette likely unchanged, with increasing obscuration of the left heart border. Right lung relatively well aerated. Progressive airspace consolidation of the left lower lobe, predominantly at the posterior aspects with trace pleural effusion. IMPRESSION: Evidence of worsening left lower lobe lobar pneumonia. Endobronchial  debris or mucous plug not excluded. Trace pleural fluid. Electronically Signed   By: Gilmer MorJaime  Wagner D.O.   On: 01/11/2017 07:16    ASSESSMENT / PLAN:   56 year old male with no significant PMH who presents with CP and cough.  Diagnosed with PNA and increasing pleural effusion.  PCCM consulted for pneumonia.  On exam, lungs are clear with decreased BS on the left base.  I reviewed CXR myself, increased pleural effusion noted with infiltrate.   Pleural effusion:  - s/p dry tap 7/27  - CT now  - Thora after verification of fluid presence  CAP:  - Amp  - Doxy  - F/U on culture  ? Empyema:  - CT now  - May need CVTS to see since we were not able to draw fluid.  PCCM will follow.  Attending note:  56 year old male with CAP and pleural effusion.  On exam, crackles noted with decrease BS on the L>R.  I reviewed CXR myself and CT myself, pleural effusion noted with infiltrate.  Discussed with PCCM-NP.  CAP:  - Doxy  - Amp  - Pan culture  Pleural effusion:  - Thora today  - Continue abx  ?Empyema  - Attempt thora  - Continue abx  - May need CVTS to evaluate  PCCM will follow  Patient seen and examined, agree with above note.  I dictated the care and orders written for this patient under my direction.  Alyson Reedy, MD [phone]  01/11/2017, 1:12 PM

## 2017-01-11 NOTE — Procedures (Signed)
Thoracentesis Procedure Note  Pre-operative Diagnosis: Pleural effusion  Post-operative Diagnosis: same  Indications: Pleural effusion with pneumonia  Procedure Details  Consent: Informed consent was obtained. Risks of the procedure were discussed including: infection, bleeding, pain, pneumothorax.  Under sterile conditions the patient was positioned. Betadine solution and sterile drapes were utilized.  1% buffered lidocaine was used to anesthetize the 6 rib space. Fluid was obtained without any difficulties and minimal blood loss.  A dressing was applied to the wound and wound care instructions were provided.   Findings 0 ml of none pleural fluid was obtained. Attempted 3 times with no success.  Complications:  None; patient tolerated the procedure well.          Condition: stable  Plan A follow up chest x-ray was ordered. Bed Rest for 0 hours. Tylenol 650 mg. for pain.  Attending Attestation: I performed the procedure.  CT ordered to ID fluid.  Caleb Avila, M.D. [facility]. Pager: [phone]. After hours pager: [phone].

## 2017-01-11 NOTE — Progress Notes (Signed)
PROGRESS NOTE    Caleb Avila L Cwikla  WUJ:[id number] DOB: 12/21/1960 DOA: 01/07/2017 PCP: Glori LuisSonnenberg, [name], MD   Brief Narrative: 56 year old male with history of hypertension, depression admitted with chest pain and found to have left  lobe pneumonia. Patient with intermittent fever. Repeat chest x-ray with possible endobronchial mucous plug. Pulmonary consult called.  Assessment & Plan:   # Sepsis due to Left lobe pneumonia/community acquired pneumonia: -Patient received about 4 days of IV antibiotics. He is currently on IV Unasyn. Cultures negative. Had temperature of 102 last night. Repeat chest x-ray with endobronchial mucous plug and pneumonia. I added doxycycline to broaden the coverage. Checking MRSA PCR screening. Continue incentive spirometer and duoneb. Follow-up culture results. -Monitor leukocytosis. -Pulmonary consult called  #Prolonged QT: Continue to monitor.  #Chest pain likely in the setting of pneumonia. Patient with mild elevation in troponin likely due to demand ischemia. Echocardiogram with normal systolic function and no wall motion abnormalities. Denied pain this morning.  #Essential hypertension: Continue to monitor blood pressure closely.  DVT prophylaxis: Lovenox subcutaneous Code Status: Full code Family Communication: No family at bedside Disposition Plan: Likely discharge home in 1-2 days    Consultants:   Pulmonary  Procedures: Echocardiogram Antimicrobials:unasyn 7/24  Subjective: Patient seen and examined at bedside. No chest pain shortness of breath but having cough. No nausea vomiting or headache. Feels weak. Objective: Vitals:   01/10/17 1700 01/10/17 2143 01/11/17 0451 01/11/17 0739  BP: (!) 157/95 (!) 159/96 (!) 152/100 124/90  Pulse: (!) 117 (!) 107 (!) 113 93  Resp: 20 18 18  (!) 22  Temp: (!) 101 F (38.3 C) (!) 100.6 F (38.1 C) (!) 102.4 F (39.1 C) 98.3 F (36.8 C)  TempSrc: Oral   Oral  SpO2: 94% 93% 93% 94%  Weight:  81.6  kg (180 lb)    Height:        Intake/Output Summary (Last 24 hours) at 01/11/17 1143 Last data filed at 01/11/17 0200  Gross per 24 hour  Intake              480 ml  Output             1475 ml  Net             -995 ml   Filed Weights   01/08/17 2105 01/09/17 2120 01/10/17 2143  Weight: 82.1 kg (181 lb) 83.2 kg (183 lb 6.8 oz) 81.6 kg (180 lb)    Examination:  General exam: Appears calm and comfortable  Respiratory system: Left basal crackle, respiratory effort normal, no wheezing Cardiovascular system: S1 & S2 heard, RRR.  No pedal edema. Gastrointestinal system: Abdomen is nondistended, soft and nontender. Normal bowel sounds heard. Central nervous system: Alert and oriented. No focal neurological deficits. Extremities: Symmetric 5 x 5 power. Skin: No rashes, lesions or ulcers Psychiatry: Judgement and insight appear normal. Mood & affect appropriate.     Data Reviewed: I have personally reviewed following labs and imaging studies  CBC:  Recent Labs Lab 01/07/17 1805 01/07/17 1812 01/09/17 0623 01/11/17 0358  WBC 17.1*  --  17.0* 15.7*  NEUTROABS  --   --  13.1*  --   HGB 13.8 14.6 14.1 13.7  HCT 40.3 43.0 42.2 40.6  MCV 86.1  --  87.6 86.0  PLT 211  --  241 301   Basic Metabolic Panel:  Recent Labs Lab 01/07/17 1805 01/07/17 1812 01/09/17 0623 01/11/17 0358  NA 136 141 140 140  K 3.8  3.8 3.6 4.2  CL 105 103 107 106  CO2 22  --  23 21*  GLUCOSE 140* 141* 112* 100*  BUN 19 23* 12 12  CREATININE 1.16 1.00 1.19 1.06  CALCIUM 8.8*  --  8.5* 8.4*  MG  --   --  1.8  --    GFR: Estimated Creatinine Clearance: 82.4 mL/min (by C-G formula based on SCr of 1.06 mg/dL). Liver Function Tests:  Recent Labs Lab 01/09/17 0623  AST 23  ALT 23  ALKPHOS 76  BILITOT 1.0  PROT 6.7  ALBUMIN 2.7*   No results for input(s): LIPASE, AMYLASE in the last 168 hours. No results for input(s): AMMONIA in the last 168 hours. Coagulation Profile: No results for  input(s): INR, PROTIME in the last 168 hours. Cardiac Enzymes:  Recent Labs Lab 01/07/17 2044 01/08/17 0224 01/08/17 0725 01/09/17 0623  TROPONINI <0.03 0.08* <0.03 0.05*   BNP (last 3 results) No results for input(s): PROBNP in the last 8760 hours. HbA1C: No results for input(s): HGBA1C in the last 72 hours. CBG: No results for input(s): GLUCAP in the last 168 hours. Lipid Profile: No results for input(s): CHOL, HDL, LDLCALC, TRIG, CHOLHDL, LDLDIRECT in the last 72 hours. Thyroid Function Tests: No results for input(s): TSH, T4TOTAL, FREET4, T3FREE, THYROIDAB in the last 72 hours. Anemia Panel: No results for input(s): VITAMINB12, FOLATE, FERRITIN, TIBC, IRON, RETICCTPCT in the last 72 hours. Sepsis Labs:  Recent Labs Lab 01/07/17 2044 01/07/17 2229  PROCALCITON 0.39  --   LATICACIDVEN 0.9 1.3    Recent Results (from the past 240 hour(s))  Culture, blood (x 2)     Status: None (Preliminary result)   Collection Time: 01/07/17  8:40 PM  Result Value Ref Range Status   Specimen Description BLOOD RIGHT ANTECUBITAL  Final   Special Requests   Final    BOTTLES DRAWN AEROBIC AND ANAEROBIC Blood Culture adequate volume   Culture NO GROWTH 3 DAYS  Final   Report Status PENDING  Incomplete  Culture, blood (x 2)     Status: None (Preliminary result)   Collection Time: 01/07/17  8:45 PM  Result Value Ref Range Status   Specimen Description BLOOD RIGHT HAND  Final   Special Requests   Final    BOTTLES DRAWN AEROBIC AND ANAEROBIC Blood Culture adequate volume   Culture NO GROWTH 3 DAYS  Final   Report Status PENDING  Incomplete  Culture, sputum-assessment     Status: None   Collection Time: 01/08/17  6:15 AM  Result Value Ref Range Status   Specimen Description SPUTUM  Final   Special Requests NONE  Final   Sputum evaluation   Final    Sputum specimen not acceptable for testing.  Please recollect.   Gram Stain Report Called to,Read Back By and Verified With: A. MABE RN, AT  [phone] 01/08/17 BY D. Leighton Roach    Report Status 01/08/2017 FINAL  Final  Respiratory Panel by PCR     Status: None   Collection Time: 01/08/17  9:53 AM  Result Value Ref Range Status   Adenovirus NOT DETECTED NOT DETECTED Final   Coronavirus 229E NOT DETECTED NOT DETECTED Final   Coronavirus HKU1 NOT DETECTED NOT DETECTED Final   Coronavirus NL63 NOT DETECTED NOT DETECTED Final   Coronavirus OC43 NOT DETECTED NOT DETECTED Final   Metapneumovirus NOT DETECTED NOT DETECTED Final   Rhinovirus / Enterovirus NOT DETECTED NOT DETECTED Final   Influenza A NOT DETECTED NOT DETECTED Final  Influenza B NOT DETECTED NOT DETECTED Final   Parainfluenza Virus 1 NOT DETECTED NOT DETECTED Final   Parainfluenza Virus 2 NOT DETECTED NOT DETECTED Final   Parainfluenza Virus 3 NOT DETECTED NOT DETECTED Final   Parainfluenza Virus 4 NOT DETECTED NOT DETECTED Final   Respiratory Syncytial Virus NOT DETECTED NOT DETECTED Final   Bordetella pertussis NOT DETECTED NOT DETECTED Final   Chlamydophila pneumoniae NOT DETECTED NOT DETECTED Final   Mycoplasma pneumoniae NOT DETECTED NOT DETECTED Final         Radiology Studies: Dg Chest 2 View  Result Date: 01/11/2017 CLINICAL DATA:  56 year old male with a history of chest pain. Known prior lobar pneumonia 01/07/2017 EXAM: CHEST  2 VIEW COMPARISON:  CT chest 01/07/2017, plain film 01/07/2017 FINDINGS: Cardiomediastinal silhouette likely unchanged, with increasing obscuration of the left heart border. Right lung relatively well aerated. Progressive airspace consolidation of the left lower lobe, predominantly at the posterior aspects with trace pleural effusion. IMPRESSION: Evidence of worsening left lower lobe lobar pneumonia. Endobronchial debris or mucous plug not excluded. Trace pleural fluid. Electronically Signed   By: Gilmer MorJaime  Wagner D.O.   On: 01/11/2017 07:16        Scheduled Meds: . aspirin EC  325 mg Oral Daily  . dextromethorphan-guaiFENesin  1  tablet Oral BID  . enoxaparin (LOVENOX) injection  40 mg Subcutaneous Q24H  . lidocaine  1 patch Transdermal Q24H  . multivitamin with minerals  1 tablet Oral Daily   Continuous Infusions: . sodium chloride 75 mL/hr at 01/11/17 0523  . ampicillin-sulbactam (UNASYN) IV 3 g (01/11/17 0518)     LOS: 4 days    Hillel Card Jaynie CollinsPrasad Jelesa Mangini, MD [facility] Pager [phone]  If 7PM-7AM, please contact night-coverage [email or web address] Password [email or web address] 01/11/2017, 11:43 AM

## 2017-01-11 NOTE — Progress Notes (Signed)
Pharmacy Antibiotic Note  Caleb Avila is a 56 y.o. male admitted on 01/07/2017 with pneumonia.  Pharmacy has been consulted for Unasyn dosing.  Noted doxycyline IV added back by MD- watch QTc as it was prolonged to 493 on 7/24 and has not been checked since (noted it was 511 on 7/23 - scan from ED)  Plan: Continue Unasyn 3g IV q6h + doxycycline 100mg  IV q12h Follow QTc closely Follow c/s, clinical progression, renal function, LOT  Height: 5\' 7"  (170.2 cm) Weight: 180 lb (81.6 kg) IBW/kg (Calculated) : 66.1  Temp (24hrs), Avg:100.6 F (38.1 C), Min:98.3 F (36.8 C), Max:102.4 F (39.1 C)   Recent Labs Lab 01/07/17 1805 01/07/17 1812 01/07/17 2044 01/07/17 2229 01/09/17 0623 01/11/17 0358  WBC 17.1*  --   --   --  17.0* 15.7*  CREATININE 1.16 1.00  --   --  1.19 1.06  LATICACIDVEN  --   --  0.9 1.3  --   --     Estimated Creatinine Clearance: 82.4 mL/min (by C-G formula based on SCr of 1.06 mg/dL).    No Known Allergies  Antimicrobials this admission: CTX 7/23 x1 Doxy 7/23>>7/24; 7/27>> Unasyn 7/24>>  Dose adjustments this admission: n/a  Microbiology results: 7/23 BCx: ngtd 7/24 resp panel: neg 7/24 sputum: not acceptable 7/24 strep pneumo: neg  Thank you for allowing pharmacy to be a part of this patient's care.  Tenesia Escudero D. Rainn Bullinger, PharmD, BCPS Clinical Pharmacist Pager: [phone] Clinical Phone for 01/11/2017 until 3:30pm: [phone] If after 3:30pm, please call main pharmacy at [phone] 01/11/2017 2:30 PM

## 2017-01-11 NOTE — Progress Notes (Signed)
Notified MD of tylenol and htn.  Will administer previously ordered med's

## 2017-01-12 DIAGNOSIS — J869 Pyothorax without fistula: Secondary | ICD-10-CM

## 2017-01-12 DIAGNOSIS — R0902 Hypoxemia: Secondary | ICD-10-CM | POA: Diagnosis present

## 2017-01-12 LAB — COMPREHENSIVE METABOLIC PANEL
ALT: 87 U/L — ABNORMAL HIGH (ref 17–63)
AST: 86 U/L — ABNORMAL HIGH (ref 15–41)
Albumin: 2.6 g/dL — ABNORMAL LOW (ref 3.5–5.0)
Alkaline Phosphatase: 127 U/L — ABNORMAL HIGH (ref 38–126)
Anion gap: 9 (ref 5–15)
BUN: 12 mg/dL (ref 6–20)
CO2: 25 mmol/L (ref 22–32)
Calcium: 8.5 mg/dL — ABNORMAL LOW (ref 8.9–10.3)
Chloride: 105 mmol/L (ref 101–111)
Creatinine, Ser: 1.18 mg/dL (ref 0.61–1.24)
GFR calc Af Amer: >60 mL/min (ref >60)
GFR calc non Af Amer: >60 mL/min (ref >60)
Glucose, Bld: 103 mg/dL — ABNORMAL HIGH (ref 65–99)
Potassium: 3.9 mmol/L (ref 3.5–5.1)
Sodium: 139 mmol/L (ref 135–145)
Total Bilirubin: 0.7 mg/dL (ref 0.3–1.2)
Total Protein: 7 g/dL (ref 6.5–8.1)

## 2017-01-12 LAB — CBC
HCT: 37.8 % — ABNORMAL LOW (ref 39.0–52.0)
HCT: 39.5 % (ref 39.0–52.0)
HEMOGLOBIN: 12.4 g/dL — AB (ref 13.0–17.0)
Hemoglobin: 13.2 g/dL (ref 13.0–17.0)
MCH: 28.7 pg (ref 26.0–34.0)
MCH: 28.9 pg (ref 26.0–34.0)
MCHC: 32.8 g/dL (ref 30.0–36.0)
MCHC: 33.4 g/dL (ref 30.0–36.0)
MCV: 87.5 fL (ref 78.0–100.0)
MCV: 86.4 fL (ref 78.0–100.0)
Platelets: 265 10*3/uL (ref 150–400)
Platelets: 355 10*3/uL (ref 150–400)
RBC: 4.32 MIL/uL (ref 4.22–5.81)
RBC: 4.57 MIL/uL (ref 4.22–5.81)
RDW: 13.4 % (ref 11.5–15.5)
RDW: 13 % (ref 11.5–15.5)
WBC: 16 10*3/uL — ABNORMAL HIGH (ref 4.0–10.5)
WBC: 15.5 10*3/uL — ABNORMAL HIGH (ref 4.0–10.5)

## 2017-01-12 LAB — CULTURE, BLOOD (ROUTINE X 2)
CULTURE: NO GROWTH
Culture: NO GROWTH
Special Requests: ADEQUATE
Special Requests: ADEQUATE

## 2017-01-12 LAB — URINALYSIS, ROUTINE W REFLEX MICROSCOPIC
Bilirubin Urine: NEGATIVE
Glucose, UA: NEGATIVE mg/dL
Hgb urine dipstick: NEGATIVE
Ketones, ur: NEGATIVE mg/dL
Leukocytes, UA: NEGATIVE
Nitrite: NEGATIVE
Protein, ur: NEGATIVE mg/dL
Specific Gravity, Urine: 1.018 (ref 1.005–1.030)
pH: 6 (ref 5.0–8.0)

## 2017-01-12 LAB — APTT: aPTT: 40 seconds — ABNORMAL HIGH (ref 24–36)

## 2017-01-12 LAB — SURGICAL PCR SCREEN
MRSA, PCR: NEGATIVE
Staphylococcus aureus: POSITIVE — AB

## 2017-01-12 LAB — PROTIME-INR
INR: 1.17
Prothrombin Time: 15 seconds (ref 11.4–15.2)

## 2017-01-12 LAB — ABO/RH: ABO/RH(D): O POS

## 2017-01-12 LAB — PREPARE RBC (CROSSMATCH)

## 2017-01-12 MED ORDER — DEXTROSE-NACL 5-0.45 % IV SOLN
INTRAVENOUS | Status: DC
  Administered 2017-01-13: 06:00:00 via INTRAVENOUS

## 2017-01-12 MED ORDER — VANCOMYCIN HCL IN DEXTROSE 1-5 GM/200ML-% IV SOLN
1000.0000 mg | INTRAVENOUS | Status: AC
  Administered 2017-01-13: 1000 mg via INTRAVENOUS
  Filled 2017-01-12: qty 200

## 2017-01-12 NOTE — Progress Notes (Signed)
PROGRESS NOTE    Caleb Avila  ZOX:[id number] DOB: 07/08/1960 DOA: 01/07/2017 PCP: Glori LuisSonnenberg, [name], MD   Brief Narrative: 56 year old male with history of hypertension, depression admitted with chest pain and found to have left  lobe pneumonia. Patient with intermittent fever. Repeat chest x-ray with possible endobronchial mucous plug. Pulmonary consult called.  Assessment & Plan:   # Sepsis due to Left lobe pneumonia/ loculated left pleural effusion likely empyema: -Patient received about 5 days of IV Unasyn and started doxycycline on 7/27. He is still having low-grade temperature. Pulmonary consult was requested. Unable to tap the fluid for analysis. CT scan of chest with loculated pleural effusion concerning for empyema. Discussed with the pulmonologist today. Cardiothoracic surgery consult was requested and discussed with Dr.Trigt for further evaluation including possible VATS. -Monitor leukocytosis. -Pulmonary consult appreciated  #Prolonged QT: Continue to monitor.  #Chest pain likely in the setting of pneumonia. Patient with mild elevation in troponin likely due to demand ischemia. Echocardiogram with normal systolic function and no wall motion abnormalities. Denied pain this morning.  #Essential hypertension: Continue to monitor blood pressure closely.  DVT prophylaxis: Lovenox subcutaneous Code Status: Full code Family Communication: No family at bedside Disposition Plan: Likely discharge home in 1-2 days    Consultants:   Pulmonary  Cardiothoracic surgeon  Procedures: Echocardiogram Antimicrobials:unasyn 7/24  Subjective: Patient seen and examined at bedside. Still having low-grade fever associated with mild cough and shortness of breath. Denied chest pain, nausea vomiting.  Objective: Vitals:   01/11/17 1815 01/11/17 2137 01/12/17 0449 01/12/17 0937  BP: (!) 142/100 (!) 163/94 (!) 150/97 (!) 142/86  Pulse: (!) 121 (!) 108 (!) 103 98  Resp: (!) 22 20 18  18   Temp: 100.3 F (37.9 C) 100.3 F (37.9 C) 100 F (37.8 C) 100 F (37.8 C)  TempSrc: Oral Oral Oral Oral  SpO2: 94% 93% 94% 94%  Weight:  83.9 kg (185 lb)    Height:  5\' 8"  (1.727 m)      Intake/Output Summary (Last 24 hours) at 01/12/17 1304 Last data filed at 01/12/17 1302  Gross per 24 hour  Intake          2843.75 ml  Output             2675 ml  Net           168.75 ml   Filed Weights   01/09/17 2120 01/10/17 2143 01/11/17 2137  Weight: 83.2 kg (183 lb 6.8 oz) 81.6 kg (180 lb) 83.9 kg (185 lb)    Examination:  General exam: Lying on bed comfortable, not in distress  Respiratory system: Left-sided,respiratoryeffortnormal Cardiovascular system: Regular rate rhythm, S1 is normal. Gastrointestinal system: Abdomen soft, nontender, nondistended. Bowel sound positive Central nervous system: Alert awake and oriented. No focal neurological deficit Extremities: Symmetric 5 x 5 power. Skin: No rashes, lesions or ulcers Psychiatry: Judgement and insight appear normal. Mood & affect appropriate.     Data Reviewed: I have personally reviewed following labs and imaging studies  CBC:  Recent Labs Lab 01/07/17 1805 01/07/17 1812 01/09/17 0623 01/11/17 0358 01/12/17 0338  WBC 17.1*  --  17.0* 15.7* 16.0*  NEUTROABS  --   --  13.1*  --   --   HGB 13.8 14.6 14.1 13.7 12.4*  HCT 40.3 43.0 42.2 40.6 37.8*  MCV 86.1  --  87.6 86.0 87.5  PLT 211  --  241 301 265   Basic Metabolic Panel:  Recent Labs Lab 01/07/17  1805 01/07/17 1812 01/09/17 0623 01/11/17 0358  NA 136 141 140 140  K 3.8 3.8 3.6 4.2  CL 105 103 107 106  CO2 22  --  23 21*  GLUCOSE 140* 141* 112* 100*  BUN 19 23* 12 12  CREATININE 1.16 1.00 1.19 1.06  CALCIUM 8.8*  --  8.5* 8.4*  MG  --   --  1.8  --    GFR: Estimated Creatinine Clearance: 85 mL/min (by C-G formula based on SCr of 1.06 mg/dL). Liver Function Tests:  Recent Labs Lab 01/09/17 0623  AST 23  ALT 23  ALKPHOS 76  BILITOT 1.0    PROT 6.7  ALBUMIN 2.7*   No results for input(s): LIPASE, AMYLASE in the last 168 hours. No results for input(s): AMMONIA in the last 168 hours. Coagulation Profile: No results for input(s): INR, PROTIME in the last 168 hours. Cardiac Enzymes:  Recent Labs Lab 01/07/17 2044 01/08/17 0224 01/08/17 0725 01/09/17 0623  TROPONINI <0.03 0.08* <0.03 0.05*   BNP (last 3 results) No results for input(s): PROBNP in the last 8760 hours. HbA1C: No results for input(s): HGBA1C in the last 72 hours. CBG: No results for input(s): GLUCAP in the last 168 hours. Lipid Profile: No results for input(s): CHOL, HDL, LDLCALC, TRIG, CHOLHDL, LDLDIRECT in the last 72 hours. Thyroid Function Tests: No results for input(s): TSH, T4TOTAL, FREET4, T3FREE, THYROIDAB in the last 72 hours. Anemia Panel: No results for input(s): VITAMINB12, FOLATE, FERRITIN, TIBC, IRON, RETICCTPCT in the last 72 hours. Sepsis Labs:  Recent Labs Lab 01/07/17 2044 01/07/17 2229  PROCALCITON 0.39  --   LATICACIDVEN 0.9 1.3    Recent Results (from the past 240 hour(s))  Culture, blood (x 2)     Status: None (Preliminary result)   Collection Time: 01/07/17  8:40 PM  Result Value Ref Range Status   Specimen Description BLOOD RIGHT ANTECUBITAL  Final   Special Requests   Final    BOTTLES DRAWN AEROBIC AND ANAEROBIC Blood Culture adequate volume   Culture NO GROWTH 4 DAYS  Final   Report Status PENDING  Incomplete  Culture, blood (x 2)     Status: None (Preliminary result)   Collection Time: 01/07/17  8:45 PM  Result Value Ref Range Status   Specimen Description BLOOD RIGHT HAND  Final   Special Requests   Final    BOTTLES DRAWN AEROBIC AND ANAEROBIC Blood Culture adequate volume   Culture NO GROWTH 4 DAYS  Final   Report Status PENDING  Incomplete  Culture, sputum-assessment     Status: None   Collection Time: 01/08/17  6:15 AM  Result Value Ref Range Status   Specimen Description SPUTUM  Final   Special  Requests NONE  Final   Sputum evaluation   Final    Sputum specimen not acceptable for testing.  Please recollect.   Gram Stain Report Called to,Read Back By and Verified With: A. MABE RN, AT [phone] 01/08/17 BY D. Leighton Roach    Report Status 01/08/2017 FINAL  Final  Respiratory Panel by PCR     Status: None   Collection Time: 01/08/17  9:53 AM  Result Value Ref Range Status   Adenovirus NOT DETECTED NOT DETECTED Final   Coronavirus 229E NOT DETECTED NOT DETECTED Final   Coronavirus HKU1 NOT DETECTED NOT DETECTED Final   Coronavirus NL63 NOT DETECTED NOT DETECTED Final   Coronavirus OC43 NOT DETECTED NOT DETECTED Final   Metapneumovirus NOT DETECTED NOT DETECTED Final  Rhinovirus / Enterovirus NOT DETECTED NOT DETECTED Final   Influenza A NOT DETECTED NOT DETECTED Final   Influenza B NOT DETECTED NOT DETECTED Final   Parainfluenza Virus 1 NOT DETECTED NOT DETECTED Final   Parainfluenza Virus 2 NOT DETECTED NOT DETECTED Final   Parainfluenza Virus 3 NOT DETECTED NOT DETECTED Final   Parainfluenza Virus 4 NOT DETECTED NOT DETECTED Final   Respiratory Syncytial Virus NOT DETECTED NOT DETECTED Final   Bordetella pertussis NOT DETECTED NOT DETECTED Final   Chlamydophila pneumoniae NOT DETECTED NOT DETECTED Final   Mycoplasma pneumoniae NOT DETECTED NOT DETECTED Final  MRSA PCR Screening     Status: None   Collection Time: 01/11/17 11:41 AM  Result Value Ref Range Status   MRSA by PCR NEGATIVE NEGATIVE Final    Comment:        The GeneXpert MRSA Assay (FDA approved for NASAL specimens only), is one component of a comprehensive MRSA colonization surveillance program. It is not intended to diagnose MRSA infection nor to guide or monitor treatment for MRSA infections.          Radiology Studies: Dg Chest 2 View  Result Date: 01/11/2017 CLINICAL DATA:  56 year old male with a history of chest pain. Known prior lobar pneumonia 01/07/2017 EXAM: CHEST  2 VIEW COMPARISON:  CT chest  01/07/2017, plain film 01/07/2017 FINDINGS: Cardiomediastinal silhouette likely unchanged, with increasing obscuration of the left heart border. Right lung relatively well aerated. Progressive airspace consolidation of the left lower lobe, predominantly at the posterior aspects with trace pleural effusion. IMPRESSION: Evidence of worsening left lower lobe lobar pneumonia. Endobronchial debris or mucous plug not excluded. Trace pleural fluid. Electronically Signed   By: Gilmer Mor D.O.   On: 01/11/2017 07:16   Ct Chest Wo Contrast  Result Date: 01/11/2017 CLINICAL DATA:  Pleural effusion, extensive cancer history hypertension, chest wall cyst EXAM: CT CHEST WITHOUT CONTRAST TECHNIQUE: Multidetector CT imaging of the chest was performed following the standard protocol without IV contrast. Sagittal and coronal MPR images reconstructed from axial data set. COMPARISON:  01/07/2017 FINDINGS: Cardiovascular: Aorta normal caliber. Minimal pericardial fluid. Heart otherwise unremarkable Mediastinum/Nodes: Esophagus normal appearance. Base of cervical region unremarkable. No thoracic adenopathy. Lungs/Pleura: Loculated LEFT pleural effusion, increased in size and loculation since previous study. Complete atelectasis of LEFT lower lobe with compressive atelectasis of LEFT upper lobe. Linear subsegmental atelectasis RIGHT lower lobe. No infiltrate or pneumothorax. Upper Abdomen: Unremarkable Musculoskeletal: Normal appearance IMPRESSION: Increase in size and loculation of LEFT pleural effusion since previous study. Atelectasis of LEFT lower lobe with minimal compressive atelectasis of LEFT upper lobe and linear subsegmental atelectasis in the RIGHT lower lobe. Electronically Signed   By: Ulyses Southward M.D.   On: 01/11/2017 16:39   Dg Chest Port 1 View  Result Date: 01/11/2017 CLINICAL DATA:  Post thoracentesis. EXAM: PORTABLE CHEST 1 VIEW COMPARISON:  Chest 6 a.m. earlier today. FINDINGS: Stable cardiomediastinal  silhouette.  RIGHT lung clear. Large LEFT effusion is unchanged from the film earlier today. There is no pneumothorax. IMPRESSION: Large LEFT effusion is unchanged.  There is no pneumothorax. Electronically Signed   By: Elsie Stain M.D.   On: 01/11/2017 16:00        Scheduled Meds: . aspirin EC  325 mg Oral Daily  . dextromethorphan-guaiFENesin  1 tablet Oral BID  . enoxaparin (LOVENOX) injection  40 mg Subcutaneous Q24H  . lidocaine  1 patch Transdermal Q24H  . multivitamin with minerals  1 tablet Oral Daily  Continuous Infusions: . sodium chloride Stopped (01/12/17 0744)  . ampicillin-sulbactam (UNASYN) IV Stopped (01/12/17 1117)  . doxycycline (VIBRAMYCIN) IV 100 mg (01/12/17 1257)     LOS: 5 days    Caleb Jaynie CollinsPrasad Bhandari, MD [facility] Pager [phone]  If 7PM-7AM, please contact night-coverage [email or web address] Password [email or web address] 01/12/2017, 1:04 PM

## 2017-01-12 NOTE — Progress Notes (Signed)
Procedure(s) (LRB): VIDEO ASSISTED THORACOSCOPY (VATS) DRAIN EMPYEMA (Left) Subjective: Patient examined, post recent CT scans of chest images personally reviewed and counseled with patient  56 year old male nonsmoker presented for evaluation of sudden onset of left pleuritic sticking chest pain. CT scan ruled out PE but showed loculated left effusion and probable left lower lobe pneumonia. He was admitted the hospital placed on IV antibiotics. His symptoms did not improve. He has had fever poor appetite and fatigue. White count is rising up to 17,000. A repeat CT scan without contrast showed a large loculated left pleural effusion with complete compression atelectasis of left lower lobe. Thoracentesis was nonproductive. Thoracic surgical evaluation was requested for drainage of probable empyema.  The patient denies previous episodes of severe pneumonia. He denies active dental infection or complaints. He denies history of thoracic trauma. He works as a Engineer, petroleumtruck loader. He denies smoking or alcohol intake or drug abuse. Objective: Vital signs in last 24 hours: Temp:  [100 F (37.8 C)-100.3 F (37.9 C)] 100 F (37.8 C) (07/28 0937) Pulse Rate:  [98-121] 98 (07/28 0937) Cardiac Rhythm: Normal sinus rhythm;Bundle branch block (07/28 0836) Resp:  [18-22] 18 (07/28 0937) BP: (142-163)/(86-100) 142/86 (07/28 0937) SpO2:  [93 %-94 %] 94 % (07/28 0937) Weight:  [185 lb (83.9 kg)] 185 lb (83.9 kg) (07/27 2137)  Hemodynamic parameters for last 24 hours:    Intake/Output from previous day: 07/27 0701 - 07/28 0700 In: 2545 [P.O.:660; I.V.:1885] Out: 1475 [Urine:1475] Intake/Output this shift: Total I/O In: 1981.3 [P.O.:240; I.V.:91.3; IV Piggyback:1650] Out: 1700 [Urine:1700]       Exam    General- alert and responsive, Caleb Avila subacutely ill   Lungs- diminished breath sounds at left base with tubular character of consolidation, without rales, wheezes   Cor- regular rate and rhythm, no murmur  , gallop   Abdomen- soft, non-tender   Extremities - warm, non-tender, minimal edema   Neuro- oriented, appropriate, no focal weakness   Lab Results: CT scan shows a rapidly enlarging left loculated pleural effusion with compression of the left lower lobe recent echocardiogram shows normal LV function without pericardial effusion  Recent Labs  01/11/17 0358 01/12/17 0338  WBC 15.7* 16.0*  HGB 13.7 12.4*  HCT 40.6 37.8*  PLT 301 265   BMET:  Recent Labs  01/11/17 0358  NA 140  K 4.2  CL 106  CO2 21*  GLUCOSE 100*  BUN 12  CREATININE 1.06  CALCIUM 8.4*    PT/INR: No results for input(s): LABPROT, INR in the last 72 hours. ABG    Component Value Date/Time   TCO2 23 01/07/2017 1812   CBG (last 3)  No results for input(s): GLUCAP in the last 72 hours.  Assessment/Plan: S/P Procedure(s) (LRB): VIDEO ASSISTED THORACOSCOPY (VATS) DRAIN EMPYEMA (Left) Left lower lobe pneumonia with empyema Processe is significantly increasing in size over the past 3 days now with total collapse of the left lower lobe. White count is increasing, bicarbonate is low and heart rate is increased Patient will need left VATS for drainage of empyema. We'll schedule for tomorrow-I discussed the procedure in detail including benefits risks and alternatives and he agrees to proceed.  LOS: 5 days    Kathlee Nationseter Van Trigt III 01/12/2017

## 2017-01-12 NOTE — Plan of Care (Signed)
Problem: Physical Regulation: Goal: Diagnostic test results will improve Outcome: Not Progressing Scheduled for VATS 01/13/17 Goal: Ability to maintain a body temperature in the normal range will improve Outcome: Not Progressing Continues with low grade fevers. Continue to monitor.   Problem: Respiratory: Goal: Respiratory status will improve Outcome: Not Progressing Poor performance with incentive spirometer

## 2017-01-12 NOTE — Progress Notes (Signed)
Name: Caleb Avila MRN: [id number] DOB: 05/25/1961    ADMISSION DATE:  01/07/2017 CONSULTATION DATE:  7/27  REFERRING MD :  Vedia PereyraBhadari   CHIEF COMPLAINT:  Worsening Pneumonia   BRIEF PATIENT DESCRIPTION:  This is a 56 year old male admitted w/ working dx of CAP on 7/23. Treated w/ IV unasyn, had 2d of doxy. PCCM asked to see on 7/27 pt still feeling weak, persistent fevers & CXR w/ progression of left sided airspace disease.    SIGNIFICANT EVENTS   Cultures BCX2 7/23:>>> RVP 7/23: negative Legionella and strep AG: negative  Pleural effusion culture 7/25>>>  abx Rocephin 7/23: x 1 dose unasyn 7/24>>> Doxy 7/23-->7/24; resumed 7/27>>>  STUDIES:  7/23 : 1. Negative for acute pulmonary embolus or aortic dissection 2. Partial consolidations in the lingula, left lower lobe and right lower lobe suspicious for multifocal pneumonia. Small left-sided pleural effusion.    HISTORY OF PRESENT ILLNESS:   This is a 56 year old male who was admitted on 7/23 w/ cc: NP dry cough, sob, left sided flank and pleuritic chest pain. Temp was 99.6 WBC 17.1 sats 94%. CXR and CT evaluation of chest showed left sided multifocal PNA & small effusion. He was admitted w/ working dx of CAP and sepsis. Started on unasyn , received 2 days of Doxy. His CBC has been slowly improving, but still spiking temps. CXR obtained on 7/27 showed progressive Left sided airspace disease w/ pleural effusion and possible endobronchial debris. PCCm asked to see for  Worsening PNA.   SUBJECTIVE:  No events overnight, feels better  VITAL SIGNS: Temp:  [100 F (37.8 C)-100.3 F (37.9 C)] 100 F (37.8 C) (07/28 0937) Pulse Rate:  [98-121] 98 (07/28 0937) Resp:  [18-22] 18 (07/28 0937) BP: (142-163)/(86-100) 142/86 (07/28 0937) SpO2:  [93 %-94 %] 94 % (07/28 0937) Weight:  [83.9 kg (185 lb)] 83.9 kg (185 lb) (07/27 2137)  PHYSICAL EXAMINATION: General appearance:  56 Year old  Male/male, well nourishedNAD,  currently in acute distress, conversant  Eyes: anicteric sclerae, moist conjunctivae; PERRL, EOMI bilaterally. Mouth:  membranes and no mucosal ulcerations; normal hard and soft palate Neck: Trachea midline; neck supple, no JVD Lungs/chest: decreased on left, normal respiratory effort and no intercostal retractions CV: RRR, no MRGs  Abdomen: Soft, non-tender; no masses or HSM Extremities: No peripheral edema  Skin: Normal temperature, turgor and texture; no rash, ulcers or subcutaneous nodules Psych: Appropriate affect, alert and oriented to person, place and time    Recent Labs Lab 01/07/17 1805 01/07/17 1812 01/09/17 0623 01/11/17 0358  NA 136 141 140 140  K 3.8 3.8 3.6 4.2  CL 105 103 107 106  CO2 22  --  23 21*  BUN 19 23* 12 12  CREATININE 1.16 1.00 1.19 1.06  GLUCOSE 140* 141* 112* 100*    Recent Labs Lab 01/09/17 0623 01/11/17 0358 01/12/17 0338  HGB 14.1 13.7 12.4*  HCT 42.2 40.6 37.8*  WBC 17.0* 15.7* 16.0*  PLT 241 301 265   Dg Chest 2 View  Result Date: 01/11/2017 CLINICAL DATA:  56 year old male with a history of chest pain. Known prior lobar pneumonia 01/07/2017 EXAM: CHEST  2 VIEW COMPARISON:  CT chest 01/07/2017, plain film 01/07/2017 FINDINGS: Cardiomediastinal silhouette likely unchanged, with increasing obscuration of the left heart border. Right lung relatively well aerated. Progressive airspace consolidation of the left lower lobe, predominantly at the posterior aspects with trace pleural effusion. IMPRESSION: Evidence of worsening left lower lobe lobar pneumonia.  Endobronchial debris or mucous plug not excluded. Trace pleural fluid. Electronically Signed   By: Gilmer MorJaime  Wagner D.O.   On: 01/11/2017 07:16   Ct Chest Wo Contrast  Result Date: 01/11/2017 CLINICAL DATA:  Pleural effusion, extensive cancer history hypertension, chest wall cyst EXAM: CT CHEST WITHOUT CONTRAST TECHNIQUE: Multidetector CT imaging of the chest was performed following the standard  protocol without IV contrast. Sagittal and coronal MPR images reconstructed from axial data set. COMPARISON:  01/07/2017 FINDINGS: Cardiovascular: Aorta normal caliber. Minimal pericardial fluid. Heart otherwise unremarkable Mediastinum/Nodes: Esophagus normal appearance. Base of cervical region unremarkable. No thoracic adenopathy. Lungs/Pleura: Loculated LEFT pleural effusion, increased in size and loculation since previous study. Complete atelectasis of LEFT lower lobe with compressive atelectasis of LEFT upper lobe. Linear subsegmental atelectasis RIGHT lower lobe. No infiltrate or pneumothorax. Upper Abdomen: Unremarkable Musculoskeletal: Normal appearance IMPRESSION: Increase in size and loculation of LEFT pleural effusion since previous study. Atelectasis of LEFT lower lobe with minimal compressive atelectasis of LEFT upper lobe and linear subsegmental atelectasis in the RIGHT lower lobe. Electronically Signed   By: Ulyses SouthwardMark  Boles M.D.   On: 01/11/2017 16:39   Dg Chest Port 1 View  Result Date: 01/11/2017 CLINICAL DATA:  Post thoracentesis. EXAM: PORTABLE CHEST 1 VIEW COMPARISON:  Chest 6 a.m. earlier today. FINDINGS: Stable cardiomediastinal silhouette.  RIGHT lung clear. Large LEFT effusion is unchanged from the film earlier today. There is no pneumothorax. IMPRESSION: Large LEFT effusion is unchanged.  There is no pneumothorax. Electronically Signed   By: Elsie StainJohn T Curnes M.D.   On: 01/11/2017 16:00    ASSESSMENT / PLAN:   56 year old male with no significant PMH who presents with CP and cough.  Diagnosed with PNA and increasing pleural effusion.  PCCM consulted for pneumonia.  On exam, lungs are clear with decreased BS on the left base.  I reviewed CT myself, loculated effusion noted.  Discussed with PCCM-NP.  CAP:  - Doxy  - Amp  - Pan culture to follow  Pleural effusion:  - No further attempts at thora  - Continue abx  ?Empyema  - Attempt thora  - Continue abx  - Consult CVTS for  potential VATS  PCCM will sign off, please call back if needed.  Alyson ReedyWesam G. Shakisha Abend, M.D. [facility]. Pager: [phone]. After hours pager: [phone].  01/12/2017, 12:15 PM

## 2017-01-13 ENCOUNTER — Encounter (HOSPITAL_COMMUNITY)
Admission: EM | Disposition: A | Discharge status: Home / Self Care | Payer: Self-pay | Source: Home / Self Care | Attending: Cardiothoracic Surgery

## 2017-01-13 ENCOUNTER — Inpatient Hospital Stay (HOSPITAL_COMMUNITY): Payer: BLUE CROSS/BLUE SHIELD | Admitting: Certified Registered Nurse Anesthetist

## 2017-01-13 ENCOUNTER — Encounter (HOSPITAL_COMMUNITY): Payer: Self-pay | Admitting: Certified Registered Nurse Anesthetist

## 2017-01-13 ENCOUNTER — Inpatient Hospital Stay (HOSPITAL_COMMUNITY): Payer: BLUE CROSS/BLUE SHIELD

## 2017-01-13 DIAGNOSIS — J869 Pyothorax without fistula: Secondary | ICD-10-CM | POA: Diagnosis present

## 2017-01-13 HISTORY — PX: VIDEO ASSISTED THORACOSCOPY (VATS)/EMPYEMA: SHX6172

## 2017-01-13 HISTORY — DX: Pyothorax without fistula: J86.9

## 2017-01-13 LAB — BASIC METABOLIC PANEL
Anion gap: 8 (ref 5–15)
BUN: 12 mg/dL (ref 6–20)
CALCIUM: 8.3 mg/dL — AB (ref 8.9–10.3)
CO2: 24 mmol/L (ref 22–32)
CREATININE: 1.01 mg/dL (ref 0.61–1.24)
Chloride: 106 mmol/L (ref 101–111)
GFR calc Af Amer: >60 mL/min (ref >60)
GLUCOSE: 101 mg/dL — AB (ref 65–99)
POTASSIUM: 3.6 mmol/L (ref 3.5–5.1)
SODIUM: 138 mmol/L (ref 135–145)

## 2017-01-13 LAB — POCT I-STAT 3, ART BLOOD GAS (G3+)
Acid-base deficit: 1 mmol/L (ref 0.0–2.0)
BICARBONATE: 24.5 mmol/L (ref 20.0–28.0)
O2 Saturation: 95 %
PCO2 ART: 44.4 mmHg (ref 32.0–48.0)
TCO2: 26 mmol/L (ref 0–100)
pH, Arterial: 7.349 — ABNORMAL LOW (ref 7.350–7.450)
pO2, Arterial: 77 mmHg — ABNORMAL LOW (ref 83.0–108.0)

## 2017-01-13 LAB — CBC
HEMATOCRIT: 36.7 % — AB (ref 39.0–52.0)
Hemoglobin: 12 g/dL — ABNORMAL LOW (ref 13.0–17.0)
MCH: 28.2 pg (ref 26.0–34.0)
MCHC: 32.7 g/dL (ref 30.0–36.0)
MCV: 86.2 fL (ref 78.0–100.0)
PLATELETS: 332 10*3/uL (ref 150–400)
RBC: 4.26 MIL/uL (ref 4.22–5.81)
RDW: 13.1 % (ref 11.5–15.5)
WBC: 15.1 10*3/uL — ABNORMAL HIGH (ref 4.0–10.5)

## 2017-01-13 LAB — GLUCOSE, CAPILLARY: Glucose-Capillary: 151 mg/dL — ABNORMAL HIGH (ref 65–99)

## 2017-01-13 SURGERY — VIDEO ASSISTED THORACOSCOPY (VATS)/EMPYEMA
Anesthesia: General | Site: Chest | Laterality: Left

## 2017-01-13 MED ORDER — OXYCODONE HCL 5 MG PO TABS
5.0000 mg | ORAL_TABLET | ORAL | Status: DC | PRN
  Administered 2017-01-17: 10 mg via ORAL
  Filled 2017-01-13 (×2): qty 2

## 2017-01-13 MED ORDER — FENTANYL 40 MCG/ML IV SOLN
INTRAVENOUS | Status: DC
  Administered 2017-01-13: 120 ug via INTRAVENOUS
  Administered 2017-01-13: 1000 ug via INTRAVENOUS
  Administered 2017-01-14: 105 ug via INTRAVENOUS
  Administered 2017-01-14: 45 ug via INTRAVENOUS
  Administered 2017-01-14: 30 ug via INTRAVENOUS
  Administered 2017-01-14: 90 ug via INTRAVENOUS
  Administered 2017-01-14: 105 ug via INTRAVENOUS
  Administered 2017-01-15: 1000 ug via INTRAVENOUS
  Administered 2017-01-15: 90 ug via INTRAVENOUS
  Administered 2017-01-15: 30 ug via INTRAVENOUS
  Administered 2017-01-16: 45 ug via INTRAVENOUS
  Administered 2017-01-16: 0 ug via INTRAVENOUS
  Administered 2017-01-16: 45 ug via INTRAVENOUS
  Administered 2017-01-16: 30 ug via INTRAVENOUS
  Administered 2017-01-16: 60 ug via INTRAVENOUS
  Administered 2017-01-16: 120 ug via INTRAVENOUS
  Administered 2017-01-17: 0 ug via INTRAVENOUS
  Filled 2017-01-13 (×2): qty 25

## 2017-01-13 MED ORDER — MIDAZOLAM HCL 5 MG/5ML IJ SOLN
INTRAMUSCULAR | Status: DC | PRN
Start: 2017-01-13 — End: 2017-01-13
  Administered 2017-01-13: 2 mg via INTRAVENOUS

## 2017-01-13 MED ORDER — ALBUMIN HUMAN 5 % IV SOLN
INTRAVENOUS | Status: DC | PRN
  Administered 2017-01-13: 17:00:00 via INTRAVENOUS

## 2017-01-13 MED ORDER — 0.9 % SODIUM CHLORIDE (POUR BTL) OPTIME
TOPICAL | Status: DC | PRN
Start: 2017-01-13 — End: 2017-01-13
  Administered 2017-01-13: 3000 mL

## 2017-01-13 MED ORDER — FENTANYL CITRATE (PF) 100 MCG/2ML IJ SOLN: 25.0000 ug | INTRAMUSCULAR | Status: DC | PRN

## 2017-01-13 MED ORDER — ONDANSETRON HCL 4 MG/2ML IJ SOLN: 4.0000 mg | Freq: Four times a day (QID) | INTRAMUSCULAR | Status: DC | PRN

## 2017-01-13 MED ORDER — ASPIRIN EC 325 MG PO TBEC
325.0000 mg | DELAYED_RELEASE_TABLET | Freq: Every day | ORAL | Status: DC
  Administered 2017-01-14 – 2017-01-20 (×7): 325 mg via ORAL
  Filled 2017-01-13 (×7): qty 1

## 2017-01-13 MED ORDER — ENOXAPARIN SODIUM 40 MG/0.4ML ~~LOC~~ SOLN
40.0000 mg | Freq: Every day | SUBCUTANEOUS | Status: DC
  Administered 2017-01-14 – 2017-01-19 (×6): 40 mg via SUBCUTANEOUS
  Filled 2017-01-13 (×7): qty 0.4

## 2017-01-13 MED ORDER — MUPIROCIN 2 % EX OINT
1.0000 "application " | TOPICAL_OINTMENT | Freq: Two times a day (BID) | CUTANEOUS | Status: AC
  Administered 2017-01-13 – 2017-01-17 (×10): 1 via NASAL
  Filled 2017-01-13 (×4): qty 22

## 2017-01-13 MED ORDER — NALOXONE HCL 0.4 MG/ML IJ SOLN: 0.4000 mg | INTRAMUSCULAR | Status: DC | PRN

## 2017-01-13 MED ORDER — LACTATED RINGERS IV SOLN
INTRAVENOUS | Status: DC | PRN
Start: 2017-01-13 — End: 2017-01-13
  Administered 2017-01-13 (×2): via INTRAVENOUS

## 2017-01-13 MED ORDER — PANTOPRAZOLE SODIUM 40 MG IV SOLR
40.0000 mg | INTRAVENOUS | Status: DC
  Administered 2017-01-14 – 2017-01-16 (×3): 40 mg via INTRAVENOUS
  Filled 2017-01-13 (×3): qty 40

## 2017-01-13 MED ORDER — DIPHENHYDRAMINE HCL 50 MG/ML IJ SOLN: 12.5000 mg | Freq: Four times a day (QID) | INTRAMUSCULAR | Status: DC | PRN

## 2017-01-13 MED ORDER — CHLORHEXIDINE GLUCONATE CLOTH 2 % EX PADS: 6.0000 | MEDICATED_PAD | Freq: Every day | CUTANEOUS | Status: DC

## 2017-01-13 MED ORDER — PHENYLEPHRINE HCL 10 MG/ML IJ SOLN
INTRAMUSCULAR | Status: DC | PRN
  Administered 2017-01-13 (×2): 80 ug via INTRAVENOUS

## 2017-01-13 MED ORDER — BISACODYL 5 MG PO TBEC
10.0000 mg | DELAYED_RELEASE_TABLET | Freq: Every day | ORAL | Status: DC
  Administered 2017-01-14 – 2017-01-18 (×5): 10 mg via ORAL
  Filled 2017-01-13 (×6): qty 2

## 2017-01-13 MED ORDER — ROCURONIUM BROMIDE 100 MG/10ML IV SOLN
INTRAVENOUS | Status: DC | PRN
  Administered 2017-01-13: 50 mg via INTRAVENOUS
  Administered 2017-01-13: 20 mg via INTRAVENOUS
  Administered 2017-01-13: 10 mg via INTRAVENOUS

## 2017-01-13 MED ORDER — LEVALBUTEROL HCL 1.25 MG/0.5ML IN NEBU: 1.2500 mg | INHALATION_SOLUTION | Freq: Four times a day (QID) | RESPIRATORY_TRACT | Status: DC | PRN

## 2017-01-13 MED ORDER — SUGAMMADEX SODIUM 200 MG/2ML IV SOLN
INTRAVENOUS | Status: DC | PRN
  Administered 2017-01-13: 500 mg via INTRAVENOUS

## 2017-01-13 MED ORDER — DIPHENHYDRAMINE HCL 12.5 MG/5ML PO ELIX: 12.5000 mg | ORAL_SOLUTION | Freq: Four times a day (QID) | ORAL | Status: DC | PRN

## 2017-01-13 MED ORDER — TRAMADOL HCL 50 MG PO TABS
50.0000 mg | ORAL_TABLET | Freq: Four times a day (QID) | ORAL | Status: DC | PRN
  Administered 2017-01-16 – 2017-01-19 (×3): 100 mg via ORAL
  Filled 2017-01-13 (×3): qty 2

## 2017-01-13 MED ORDER — ROCURONIUM BROMIDE 10 MG/ML (PF) SYRINGE
PREFILLED_SYRINGE | INTRAVENOUS | Status: AC
  Filled 2017-01-13: qty 5

## 2017-01-13 MED ORDER — LIDOCAINE 2% (20 MG/ML) 5 ML SYRINGE
INTRAMUSCULAR | Status: AC
  Filled 2017-01-13: qty 5

## 2017-01-13 MED ORDER — METOCLOPRAMIDE HCL 5 MG/ML IJ SOLN
10.0000 mg | Freq: Four times a day (QID) | INTRAMUSCULAR | Status: DC
  Administered 2017-01-13 – 2017-01-19 (×18): 10 mg via INTRAVENOUS
  Filled 2017-01-13 (×20): qty 2

## 2017-01-13 MED ORDER — ACETAMINOPHEN 500 MG PO TABS
1000.0000 mg | ORAL_TABLET | Freq: Four times a day (QID) | ORAL | Status: AC
  Administered 2017-01-13 – 2017-01-18 (×17): 1000 mg via ORAL
  Filled 2017-01-13 (×18): qty 2

## 2017-01-13 MED ORDER — ORAL CARE MOUTH RINSE
15.0000 mL | Freq: Two times a day (BID) | OROMUCOSAL | Status: DC
Start: 2017-01-13 — End: 2017-01-20
  Administered 2017-01-14 – 2017-01-19 (×7): 15 mL via OROMUCOSAL

## 2017-01-13 MED ORDER — FENTANYL CITRATE (PF) 100 MCG/2ML IJ SOLN
INTRAMUSCULAR | Status: DC | PRN
Start: 2017-01-13 — End: 2017-01-13
  Administered 2017-01-13 (×7): 50 ug via INTRAVENOUS
  Administered 2017-01-13: 100 ug via INTRAVENOUS
  Administered 2017-01-13: 50 ug via INTRAVENOUS

## 2017-01-13 MED ORDER — PHENYLEPHRINE HCL 10 MG/ML IJ SOLN
INTRAVENOUS | Status: DC | PRN
  Administered 2017-01-13: 30 ug/min via INTRAVENOUS

## 2017-01-13 MED ORDER — FENTANYL CITRATE (PF) 250 MCG/5ML IJ SOLN
INTRAMUSCULAR | Status: AC
  Filled 2017-01-13: qty 5

## 2017-01-13 MED ORDER — PIPERACILLIN-TAZOBACTAM 3.375 G IVPB
3.3750 g | Freq: Three times a day (TID) | INTRAVENOUS | Status: DC
  Administered 2017-01-13 – 2017-01-20 (×19): 3.375 g via INTRAVENOUS
  Filled 2017-01-13 (×22): qty 50

## 2017-01-13 MED ORDER — VANCOMYCIN HCL IN DEXTROSE 1-5 GM/200ML-% IV SOLN
1000.0000 mg | Freq: Two times a day (BID) | INTRAVENOUS | Status: DC
  Administered 2017-01-14: 1000 mg via INTRAVENOUS
  Filled 2017-01-13 (×2): qty 200

## 2017-01-13 MED ORDER — POTASSIUM CHLORIDE 10 MEQ/50ML IV SOLN
10.0000 meq | Freq: Every day | INTRAVENOUS | Status: DC | PRN
  Filled 2017-01-13: qty 50

## 2017-01-13 MED ORDER — HYDROMORPHONE HCL 1 MG/ML IJ SOLN
0.2500 mg | INTRAMUSCULAR | Status: DC | PRN
  Administered 2017-01-13 (×2): 0.5 mg via INTRAVENOUS

## 2017-01-13 MED ORDER — DEXAMETHASONE SODIUM PHOSPHATE 4 MG/ML IJ SOLN
INTRAMUSCULAR | Status: DC | PRN
  Administered 2017-01-13: 10 mg via INTRAVENOUS

## 2017-01-13 MED ORDER — DEXTROSE-NACL 5-0.45 % IV SOLN
INTRAVENOUS | Status: DC
  Administered 2017-01-13: 21:00:00 via INTRAVENOUS

## 2017-01-13 MED ORDER — INSULIN ASPART 100 UNIT/ML ~~LOC~~ SOLN
0.0000 [IU] | SUBCUTANEOUS | Status: DC
  Administered 2017-01-13 – 2017-01-14 (×2): 2 [IU] via SUBCUTANEOUS
  Administered 2017-01-14: 4 [IU] via SUBCUTANEOUS
  Administered 2017-01-14 – 2017-01-15 (×2): 2 [IU] via SUBCUTANEOUS

## 2017-01-13 MED ORDER — LIDOCAINE HCL (CARDIAC) 20 MG/ML IV SOLN
INTRAVENOUS | Status: DC | PRN
  Administered 2017-01-13: 60 mg via INTRAVENOUS

## 2017-01-13 MED ORDER — PROPOFOL 10 MG/ML IV BOLUS
INTRAVENOUS | Status: DC | PRN
  Administered 2017-01-13 (×2): 100 mg via INTRAVENOUS

## 2017-01-13 MED ORDER — SENNOSIDES-DOCUSATE SODIUM 8.6-50 MG PO TABS
1.0000 | ORAL_TABLET | Freq: Every day | ORAL | Status: DC
  Administered 2017-01-13 – 2017-01-19 (×7): 1 via ORAL
  Filled 2017-01-13 (×7): qty 1

## 2017-01-13 MED ORDER — MIDAZOLAM HCL 2 MG/2ML IJ SOLN
INTRAMUSCULAR | Status: AC
  Filled 2017-01-13: qty 2

## 2017-01-13 MED ORDER — SODIUM CHLORIDE 0.9% FLUSH: 9.0000 mL | INTRAVENOUS | Status: DC | PRN

## 2017-01-13 MED ORDER — ACETAMINOPHEN 160 MG/5ML PO SOLN: 1000.0000 mg | Freq: Four times a day (QID) | ORAL | Status: AC

## 2017-01-13 MED ORDER — ONDANSETRON HCL 4 MG/2ML IJ SOLN
INTRAMUSCULAR | Status: DC | PRN
  Administered 2017-01-13: 4 mg via INTRAVENOUS

## 2017-01-13 MED ORDER — HYDROMORPHONE HCL 1 MG/ML IJ SOLN
INTRAMUSCULAR | Status: AC
  Administered 2017-01-13: 0.5 mg via INTRAVENOUS
  Filled 2017-01-13: qty 1

## 2017-01-13 MED ORDER — PROPOFOL 10 MG/ML IV BOLUS
INTRAVENOUS | Status: AC
  Filled 2017-01-13: qty 20

## 2017-01-13 SURGICAL SUPPLY — 76 items
BAG DECANTER FOR FLEXI CONT (MISCELLANEOUS) IMPLANT
BLADE SURG 11 STRL SS (BLADE) ×3 IMPLANT
CANISTER SUCT 3000ML PPV (MISCELLANEOUS) ×3 IMPLANT
CATH KIT ON Q 5IN SLV (PAIN MANAGEMENT) IMPLANT
CATH ROBINSON RED A/P 22FR (CATHETERS) IMPLANT
CATH THORACIC 28FR (CATHETERS) ×3 IMPLANT
CATH THORACIC 36FR (CATHETERS) IMPLANT
CATH THORACIC 36FR RT ANG (CATHETERS) IMPLANT
CONN ST 1/4X3/8  BEN (MISCELLANEOUS) ×4
CONN ST 1/4X3/8 BEN (MISCELLANEOUS) ×2 IMPLANT
CONN Y 3/8X3/8X3/8  BEN (MISCELLANEOUS) ×2
CONN Y 3/8X3/8X3/8 BEN (MISCELLANEOUS) ×1 IMPLANT
CONT SPEC 4OZ CLIKSEAL STRL BL (MISCELLANEOUS) ×6 IMPLANT
DERMABOND ADVANCED (GAUZE/BANDAGES/DRESSINGS) ×2
DERMABOND ADVANCED .7 DNX12 (GAUZE/BANDAGES/DRESSINGS) ×1 IMPLANT
DRAIN CHANNEL 28F RND 3/8 FF (WOUND CARE) ×3 IMPLANT
DRAIN CHANNEL 32F RND 10.7 FF (WOUND CARE) ×3 IMPLANT
DRAPE LAPAROSCOPIC ABDOMINAL (DRAPES) ×3 IMPLANT
DRAPE WARM FLUID 44X44 (DRAPE) ×3 IMPLANT
ELECT BLADE 4.0 EZ CLEAN MEGAD (MISCELLANEOUS) ×3
ELECT REM PT RETURN 9FT ADLT (ELECTROSURGICAL) ×3
ELECTRODE BLDE 4.0 EZ CLN MEGD (MISCELLANEOUS) ×1 IMPLANT
ELECTRODE REM PT RTRN 9FT ADLT (ELECTROSURGICAL) ×1 IMPLANT
GAUZE SPONGE 4X4 12PLY STRL (GAUZE/BANDAGES/DRESSINGS) ×3 IMPLANT
GAUZE SPONGE 4X4 12PLY STRL LF (GAUZE/BANDAGES/DRESSINGS) ×3 IMPLANT
GLOVE BIO SURGEON STRL SZ 6.5 (GLOVE) ×4 IMPLANT
GLOVE BIO SURGEONS STRL SZ 6.5 (GLOVE) ×2
GLOVE BIOGEL PI IND STRL 6 (GLOVE) ×3 IMPLANT
GLOVE BIOGEL PI INDICATOR 6 (GLOVE) ×6
GLOVE SURG SS PI 6.0 STRL IVOR (GLOVE) ×3 IMPLANT
GOWN STRL REUS W/ TWL LRG LVL3 (GOWN DISPOSABLE) ×4 IMPLANT
GOWN STRL REUS W/TWL LRG LVL3 (GOWN DISPOSABLE) ×8
KIT BASIN OR (CUSTOM PROCEDURE TRAY) ×3 IMPLANT
KIT ROOM TURNOVER OR (KITS) ×3 IMPLANT
KIT SUCTION CATH 14FR (SUCTIONS) ×3 IMPLANT
NS IRRIG 1000ML POUR BTL (IV SOLUTION) ×9 IMPLANT
PACK CHEST (CUSTOM PROCEDURE TRAY) ×3 IMPLANT
PAD ARMBOARD 7.5X6 YLW CONV (MISCELLANEOUS) ×6 IMPLANT
SEALANT SURG COSEAL 4ML (VASCULAR PRODUCTS) IMPLANT
SEALANT SURG COSEAL 8ML (VASCULAR PRODUCTS) ×3 IMPLANT
SOLUTION ANTI FOG 6CC (MISCELLANEOUS) ×3 IMPLANT
SPONGE TONSIL 1 RF SGL (DISPOSABLE) ×3 IMPLANT
SPONGE TONSIL 1.25 RF SGL STRG (GAUZE/BANDAGES/DRESSINGS) ×3 IMPLANT
SUT CHROMIC 3 0 SH 27 (SUTURE) IMPLANT
SUT ETHILON 3 0 PS 1 (SUTURE) IMPLANT
SUT PROLENE 3 0 SH DA (SUTURE) IMPLANT
SUT PROLENE 4 0 RB 1 (SUTURE)
SUT PROLENE 4-0 RB1 .5 CRCL 36 (SUTURE) IMPLANT
SUT PROLENE 6 0 C 1 30 (SUTURE) IMPLANT
SUT SILK  1 MH (SUTURE) ×6
SUT SILK 1 MH (SUTURE) ×3 IMPLANT
SUT SILK 1 TIES 10X30 (SUTURE) IMPLANT
SUT SILK 2 0SH CR/8 30 (SUTURE) IMPLANT
SUT SILK 3 0SH CR/8 30 (SUTURE) IMPLANT
SUT VIC AB 1 CTX 18 (SUTURE) ×3 IMPLANT
SUT VIC AB 1 CTX 36 (SUTURE) ×2
SUT VIC AB 1 CTX36XBRD ANBCTR (SUTURE) ×1 IMPLANT
SUT VIC AB 2 TP1 27 (SUTURE) IMPLANT
SUT VIC AB 2-0 CT2 18 VCP726D (SUTURE) IMPLANT
SUT VIC AB 2-0 CTX 36 (SUTURE) ×3 IMPLANT
SUT VIC AB 3-0 SH 18 (SUTURE) IMPLANT
SUT VIC AB 3-0 X1 27 (SUTURE) IMPLANT
SUT VICRYL 0 UR6 27IN ABS (SUTURE) IMPLANT
SUT VICRYL 2 TP 1 (SUTURE) ×3 IMPLANT
SWAB COLLECTION DEVICE MRSA (MISCELLANEOUS) ×3 IMPLANT
SWAB CULTURE ESWAB REG 1ML (MISCELLANEOUS) IMPLANT
SYSTEM SAHARA CHEST DRAIN ATS (WOUND CARE) ×3 IMPLANT
TAPE CLOTH SURG 4X10 WHT LF (GAUZE/BANDAGES/DRESSINGS) ×3 IMPLANT
TIP APPLICATOR SPRAY EXTEND 16 (VASCULAR PRODUCTS) IMPLANT
TOWEL GREEN STERILE (TOWEL DISPOSABLE) ×3 IMPLANT
TOWEL GREEN STERILE FF (TOWEL DISPOSABLE) IMPLANT
TOWEL OR 17X24 6PK STRL BLUE (TOWEL DISPOSABLE) ×3 IMPLANT
TOWEL OR 17X26 10 PK STRL BLUE (TOWEL DISPOSABLE) ×6 IMPLANT
TRAP SPECIMEN MUCOUS 40CC (MISCELLANEOUS) ×6 IMPLANT
TRAY FOLEY W/METER SILVER 16FR (SET/KITS/TRAYS/PACK) ×3 IMPLANT
WATER STERILE IRR 1000ML POUR (IV SOLUTION) ×6 IMPLANT

## 2017-01-13 NOTE — Progress Notes (Signed)
Pharmacy Antibiotic Note  Caleb Avila is a 56 y.o. male admitted on 01/07/2017 with empyema.  Pharmacy has been consulted for vancomycin and Zosyn dosing. Patient was on Unasyn and doxycycline. Now s/p VATS to broaden coverage.   Received vancomycin 1g pre-op ~1530 this afternoon.  Plan: Vancomycin 1g IV every 12 hours.  Goal trough 15-20 mcg/mL. Zosyn 3.375g IV q8h (4 hour infusion).  Height: 5\' 8"  (172.7 cm) Weight: 179 lb 3.2 oz (81.3 kg) IBW/kg (Calculated) : 68.4  Temp (24hrs), Avg:98.9 F (37.2 C), Min:97.3 F (36.3 C), Max:100.4 F (38 C)   Recent Labs Lab 01/07/17 1812 01/07/17 2044 01/07/17 2229 01/09/17 0623 01/11/17 0358 01/12/17 0338 01/12/17 1621 01/13/17 0627  WBC  --   --   --  17.0* 15.7* 16.0* 15.5* 15.1*  CREATININE 1.00  --   --  1.19 1.06  --  1.18 1.01  LATICACIDVEN  --  0.9 1.3  --   --   --   --   --     Estimated Creatinine Clearance: 81.8 mL/min (by C-G formula based on SCr of 1.01 mg/dL).    No Known Allergies  Antimicrobials this admission: CTX 7/23 x1 Doxy 7/23 >> 7/24; 7/27>>7/29 Unasyn 7/24 >>7/29 Zosyn 7/29>> Vanc 7/29>>  Dose adjustments this admission: n/a  Microbiology results: 7/23 BCx: neg 7/24 resp panel: neg 7/24 sputum: not acceptable 7/24 strep pneumo: neg 7/27 MRSA PCR: neg 7/28 MRSA PCR: neg, staph aureus: pos 7/29 pleural fluid A: 7/29 pleural fluid B: 7/29 lung tissue AFB: 7/29 lung tissue fungus:   Thank you for allowing pharmacy to be a part of this patient's care.  Laketia Vicknair D. Jayani Rozman, PharmD, BCPS Clinical Pharmacist 01/13/2017 7:48 PM

## 2017-01-13 NOTE — Progress Notes (Signed)
The patient was examined and preop studies reviewed. There has been no change from the prior exam and the patient is ready for surgery.  plan Left VATS drainage of empyema T Polzin

## 2017-01-13 NOTE — Progress Notes (Signed)
PROGRESS NOTE    Caleb Avila  RUE:[id number] DOB: 1961-01-10 DOA: 01/07/2017 PCP: Glori Luis, MD   Brief Narrative: 56 year old male with history of hypertension, depression admitted with chest pain and found to have left  lobe pneumonia. Patient with intermittent fever. Repeat chest x-ray with possible endobronchial mucous plug. Pulmonary consult called.  Assessment & Plan:   # Sepsis due to Left lobe pneumonia/ loculated left pleural effusion likely empyema: -Patient with intermittent fever despite of antibiotics therefore pulmonary critical care consult was requested. Pulmonologist unable to tap the fluid for analysis. CT scan of chest with loculated effusion concerning for empyema. Cardiothoracic surgery consult requested. Plan for VATS procedure today. Patient is still with low-grade temperature. Continue current antibiotics and follow up  fluid analysis/culture results. I'll discontinue doxycycline since MRSA screen is negative and patient has prolonged QT. -Monitor leukocytosis.  #Prolonged QT: Continue to monitor. Repeat EKG today.   #Chest pain likely in the setting of pneumonia. Patient with mild elevation in troponin likely due to demand ischemia. Echocardiogram with normal systolic function and no wall motion abnormalities. Denied pain this morning.  #Essential hypertension: Continue to monitor blood pressure closely.  DVT prophylaxis: Lovenox subcutaneous Code Status: Full code Family Communication: No family at bedside Disposition Plan: Likely discharge home in 1-2 days    Consultants:   Pulmonary  Cardiothoracic surgeon  Procedures: Echocardiogram Antimicrobials:unasyn 7/24  Subjective: Patient with low-grade temperature. Reported a cough and mild chest discomfort. Denied headache, dizziness, nausea or vomiting. Objective: Vitals:   01/12/17 0937 01/12/17 2045 01/13/17 0526 01/13/17 0927  BP: (!) 142/86 (!) 139/92 (!) 141/87 (!) 141/96  Pulse:  98 (!) 101 100 (!) 107  Resp: 18 18 18 18   Temp: 100 F (37.8 C) 99.6 F (37.6 C) 99.7 F (37.6 C) (!) 100.4 F (38 C)  TempSrc: Oral Oral Oral Oral  SpO2: 94% 93% 94% 95%  Weight:  81.3 kg (179 lb 3.2 oz)    Height:  5\' 8"  (1.727 m)      Intake/Output Summary (Last 24 hours) at 01/13/17 1341 Last data filed at 01/13/17 0900  Gross per 24 hour  Intake             1760 ml  Output             1975 ml  Net             -215 ml   Filed Weights   01/10/17 2143 01/11/17 2137 01/12/17 2045  Weight: 81.6 kg (180 lb) 83.9 kg (185 lb) 81.3 kg (179 lb 3.2 oz)    Examination:  General exam: Not in distress, lying in bed comfortable  Respiratory system: Decreased breath sound on the left bases, respiratory effort normal. Cardiovascular system: Regular rate and rhythm S1-S2 normal. Gastrointestinal system: Abdomen soft, nontender, nondistended. Bowel sound positive. Central nervous system: Alert awake and oriented. No focal neurological deficit Extremities: Symmetric 5 x 5 power. Skin: No rashes, lesions or ulcers Psychiatry: Judgement and insight appear normal. Mood & affect appropriate.     Data Reviewed: I have personally reviewed following labs and imaging studies  CBC:  Recent Labs Lab 01/09/17 0623 01/11/17 0358 01/12/17 0338 01/12/17 1621 01/13/17 0627  WBC 17.0* 15.7* 16.0* 15.5* 15.1*  NEUTROABS 13.1*  --   --   --   --   HGB 14.1 13.7 12.4* 13.2 12.0*  HCT 42.2 40.6 37.8* 39.5 36.7*  MCV 87.6 86.0 87.5 86.4 86.2  PLT 241 301 265  355 332   Basic Metabolic Panel:  Recent Labs Lab 01/07/17 1805 01/07/17 1812 01/09/17 0623 01/11/17 0358 01/12/17 1621 01/13/17 0627  NA 136 141 140 140 139 138  K 3.8 3.8 3.6 4.2 3.9 3.6  CL 105 103 107 106 105 106  CO2 22  --  23 21* 25 24  GLUCOSE 140* 141* 112* 100* 103* 101*  BUN 19 23* 12 12 12 12   CREATININE 1.16 1.00 1.19 1.06 1.18 1.01  CALCIUM 8.8*  --  8.5* 8.4* 8.5* 8.3*  MG  --   --  1.8  --   --   --     GFR: Estimated Creatinine Clearance: 81.8 mL/min (by C-G formula based on SCr of 1.01 mg/dL). Liver Function Tests:  Recent Labs Lab 01/09/17 0623 01/12/17 1621  AST 23 86*  ALT 23 87*  ALKPHOS 76 127*  BILITOT 1.0 0.7  PROT 6.7 7.0  ALBUMIN 2.7* 2.6*   No results for input(s): LIPASE, AMYLASE in the last 168 hours. No results for input(s): AMMONIA in the last 168 hours. Coagulation Profile:  Recent Labs Lab 01/12/17 1621  INR 1.17   Cardiac Enzymes:  Recent Labs Lab 01/07/17 2044 01/08/17 0224 01/08/17 0725 01/09/17 0623  TROPONINI <0.03 0.08* <0.03 0.05*   BNP (last 3 results) No results for input(s): PROBNP in the last 8760 hours. HbA1C: No results for input(s): HGBA1C in the last 72 hours. CBG: No results for input(s): GLUCAP in the last 168 hours. Lipid Profile: No results for input(s): CHOL, HDL, LDLCALC, TRIG, CHOLHDL, LDLDIRECT in the last 72 hours. Thyroid Function Tests: No results for input(s): TSH, T4TOTAL, FREET4, T3FREE, THYROIDAB in the last 72 hours. Anemia Panel: No results for input(s): VITAMINB12, FOLATE, FERRITIN, TIBC, IRON, RETICCTPCT in the last 72 hours. Sepsis Labs:  Recent Labs Lab 01/07/17 2044 01/07/17 2229  PROCALCITON 0.39  --   LATICACIDVEN 0.9 1.3    Recent Results (from the past 240 hour(s))  Culture, blood (x 2)     Status: None   Collection Time: 01/07/17  8:40 PM  Result Value Ref Range Status   Specimen Description BLOOD RIGHT ANTECUBITAL  Final   Special Requests   Final    BOTTLES DRAWN AEROBIC AND ANAEROBIC Blood Culture adequate volume   Culture NO GROWTH 5 DAYS  Final   Report Status 01/12/2017 FINAL  Final  Culture, blood (x 2)     Status: None   Collection Time: 01/07/17  8:45 PM  Result Value Ref Range Status   Specimen Description BLOOD RIGHT HAND  Final   Special Requests   Final    BOTTLES DRAWN AEROBIC AND ANAEROBIC Blood Culture adequate volume   Culture NO GROWTH 5 DAYS  Final   Report  Status 01/12/2017 FINAL  Final  Culture, sputum-assessment     Status: None   Collection Time: 01/08/17  6:15 AM  Result Value Ref Range Status   Specimen Description SPUTUM  Final   Special Requests NONE  Final   Sputum evaluation   Final    Sputum specimen not acceptable for testing.  Please recollect.   Gram Stain Report Called to,Read Back By and Verified With: A. MABE RN, AT [phone] 01/08/17 BY Renato Shin. VANHOOK    Report Status 01/08/2017 FINAL  Final  Respiratory Panel by PCR     Status: None   Collection Time: 01/08/17  9:53 AM  Result Value Ref Range Status   Adenovirus NOT DETECTED NOT DETECTED Final   Coronavirus  229E NOT DETECTED NOT DETECTED Final   Coronavirus HKU1 NOT DETECTED NOT DETECTED Final   Coronavirus NL63 NOT DETECTED NOT DETECTED Final   Coronavirus OC43 NOT DETECTED NOT DETECTED Final   Metapneumovirus NOT DETECTED NOT DETECTED Final   Rhinovirus / Enterovirus NOT DETECTED NOT DETECTED Final   Influenza A NOT DETECTED NOT DETECTED Final   Influenza B NOT DETECTED NOT DETECTED Final   Parainfluenza Virus 1 NOT DETECTED NOT DETECTED Final   Parainfluenza Virus 2 NOT DETECTED NOT DETECTED Final   Parainfluenza Virus 3 NOT DETECTED NOT DETECTED Final   Parainfluenza Virus 4 NOT DETECTED NOT DETECTED Final   Respiratory Syncytial Virus NOT DETECTED NOT DETECTED Final   Bordetella pertussis NOT DETECTED NOT DETECTED Final   Chlamydophila pneumoniae NOT DETECTED NOT DETECTED Final   Mycoplasma pneumoniae NOT DETECTED NOT DETECTED Final  MRSA PCR Screening     Status: None   Collection Time: 01/11/17 11:41 AM  Result Value Ref Range Status   MRSA by PCR NEGATIVE NEGATIVE Final    Comment:        The GeneXpert MRSA Assay (FDA approved for NASAL specimens only), is one component of a comprehensive MRSA colonization surveillance program. It is not intended to diagnose MRSA infection nor to guide or monitor treatment for MRSA infections.   Surgical pcr screen      Status: Abnormal   Collection Time: 01/12/17  4:42 PM  Result Value Ref Range Status   MRSA, PCR NEGATIVE NEGATIVE Final   Staphylococcus aureus POSITIVE (A) NEGATIVE Final    Comment:        The Xpert SA Assay (FDA approved for NASAL specimens in patients over 54 years of age), is one component of a comprehensive surveillance program.  Test performance has been validated by [facility] for patients greater than or equal to 194 year old. It is not intended to diagnose infection nor to guide or monitor treatment.          Radiology Studies: Ct Chest Wo Contrast  Result Date: 01/11/2017 CLINICAL DATA:  Pleural effusion, extensive cancer history hypertension, chest wall cyst EXAM: CT CHEST WITHOUT CONTRAST TECHNIQUE: Multidetector CT imaging of the chest was performed following the standard protocol without IV contrast. Sagittal and coronal MPR images reconstructed from axial data set. COMPARISON:  01/07/2017 FINDINGS: Cardiovascular: Aorta normal caliber. Minimal pericardial fluid. Heart otherwise unremarkable Mediastinum/Nodes: Esophagus normal appearance. Base of cervical region unremarkable. No thoracic adenopathy. Lungs/Pleura: Loculated LEFT pleural effusion, increased in size and loculation since previous study. Complete atelectasis of LEFT lower lobe with compressive atelectasis of LEFT upper lobe. Linear subsegmental atelectasis RIGHT lower lobe. No infiltrate or pneumothorax. Upper Abdomen: Unremarkable Musculoskeletal: Normal appearance IMPRESSION: Increase in size and loculation of LEFT pleural effusion since previous study. Atelectasis of LEFT lower lobe with minimal compressive atelectasis of LEFT upper lobe and linear subsegmental atelectasis in the RIGHT lower lobe. Electronically Signed   By: Ulyses SouthwardMark  Boles M.D.   On: 01/11/2017 16:39   Dg Chest Port 1 View  Result Date: 01/11/2017 CLINICAL DATA:  Post thoracentesis. EXAM: PORTABLE CHEST 1 VIEW COMPARISON:  Chest 6 a.m.  earlier today. FINDINGS: Stable cardiomediastinal silhouette.  RIGHT lung clear. Large LEFT effusion is unchanged from the film earlier today. There is no pneumothorax. IMPRESSION: Large LEFT effusion is unchanged.  There is no pneumothorax. Electronically Signed   By: Elsie StainJohn T Curnes M.D.   On: 01/11/2017 16:00        Scheduled Meds: . aspirin EC  325 mg Oral Daily  . Chlorhexidine Gluconate Cloth  6 each Topical Daily  . dextromethorphan-guaiFENesin  1 tablet Oral BID  . enoxaparin (LOVENOX) injection  40 mg Subcutaneous Q24H  . lidocaine  1 patch Transdermal Q24H  . multivitamin with minerals  1 tablet Oral Daily  . mupirocin ointment  1 application Nasal BID   Continuous Infusions: . sodium chloride Stopped (01/12/17 0744)  . ampicillin-sulbactam (UNASYN) IV Stopped (01/13/17 1119)  . dextrose 5 % and 0.45% NaCl 75 mL/hr at 01/13/17 0535  . doxycycline (VIBRAMYCIN) IV 100 mg (01/13/17 1151)  . vancomycin       LOS: 6 days    Oksana Deberry Jaynie Collins, MD [facility] Pager [phone]  If 7PM-7AM, please contact night-coverage [email or web address] Password [email or web address] 01/13/2017, 1:41 PM

## 2017-01-13 NOTE — Transfer of Care (Signed)
Immediate Anesthesia Transfer of Care Note  Patient: Caleb Avila  Procedure(s) Performed: Procedure(s): VIDEO ASSISTED THORACOSCOPY (VATS) DRAIN EMPYEMA (Left)  Patient Location: PACU  Anesthesia Type:General  Level of Consciousness: awake and alert   Airway & Oxygen Therapy: Patient Spontanous Breathing and Patient connected to face mask oxygen  Post-op Assessment: Report given to RN, Post -op Vital signs reviewed and stable and Patient moving all extremities  Post vital signs: Reviewed and stable  Last Vitals:  Vitals:   01/13/17 1741 01/13/17 1745  BP: 139/82   Pulse: 100 100  Resp: (!) 21 (!) 23  Temp: (!) 36.3 C     Last Pain:  Vitals:   01/13/17 0927  TempSrc: Oral  PainSc:       Patients Stated Pain Goal: 2 (01/12/17 2235)  Complications: No apparent anesthesia complications

## 2017-01-13 NOTE — Anesthesia Preprocedure Evaluation (Addendum)
Anesthesia Evaluation  Patient identified by MRN, date of birth, ID band Patient awake    Reviewed: Allergy & Precautions, H&P , NPO status , Patient's Chart, lab work & pertinent test results  Airway Mallampati: II  TM Distance: >3 FB Neck ROM: Full    Dental no notable dental hx. (+) Poor Dentition, Dental Advisory Given   Pulmonary pneumonia, unresolved,    Pulmonary exam normal breath sounds clear to auscultation       Cardiovascular hypertension, Pt. on medications  Rhythm:Regular Rate:Normal     Neuro/Psych  Headaches, Depression    GI/Hepatic negative GI ROS, Neg liver ROS,   Endo/Other  negative endocrine ROS  Renal/GU negative Renal ROS  negative genitourinary   Musculoskeletal   Abdominal   Peds  Hematology negative hematology ROS (+)   Anesthesia Other Findings   Reproductive/Obstetrics negative OB ROS                            Anesthesia Physical Anesthesia Plan  ASA: II  Anesthesia Plan: General   Post-op Pain Management:    Induction: Intravenous  PONV Risk Score and Plan: 3 and Ondansetron, Dexamethasone and Midazolam  Airway Management Planned: Double Lumen EBT  Additional Equipment: Arterial line  Intra-op Plan:   Post-operative Plan: Extubation in OR  Informed Consent: I have reviewed the patients History and Physical, chart, labs and discussed the procedure including the risks, benefits and alternatives for the proposed anesthesia with the patient or authorized representative who has indicated his/her understanding and acceptance.   Dental advisory given  Plan Discussed with: CRNA  Anesthesia Plan Comments:         Anesthesia Quick Evaluation

## 2017-01-13 NOTE — Anesthesia Procedure Notes (Signed)
Arterial Line Insertion Start/End7/29/2018 2:30 PM, 01/13/2017 2:40 PM Performed by: Gaynelle AduFITZGERALD, Thermon Zulauf, anesthesiologist  Patient location: Pre-op. Preanesthetic checklist: patient identified, IV checked, site marked, risks and benefits discussed, surgical consent, monitors and equipment checked, pre-op evaluation and timeout performed Lidocaine 1% used for infiltration and patient sedated Right, brachial was placed Catheter size: 18 G Hand hygiene performed , maximum sterile barriers used  and Seldinger technique used  Attempts: 1 Procedure performed using ultrasound guided technique. Ultrasound Notes:anatomy identified, needle tip was noted to be adjacent to the nerve/plexus identified, no ultrasound evidence of intravascular and/or intraneural injection and image(s) printed for medical record Following insertion, line sutured, dressing applied and Biopatch. Post procedure assessment: normal  Patient tolerated the procedure well with no immediate complications.

## 2017-01-13 NOTE — Brief Op Note (Signed)
01/07/2017 - 01/13/2017  5:19 PM  PATIENT:  Caleb Avila  56 y.o. male  PRE-OPERATIVE DIAGNOSIS:  left empyema  POST-OPERATIVE DIAGNOSIS:  left empyema  PROCEDURE:  Procedure(s): VIDEO ASSISTED THORACOSCOPY (VATS) DRAIN EMPYEMA (Left)  SURGEON:  Surgeon(s) and Role:    Kerin Perna* Van Trigt, Peter, MD - Primary  PHYSICIAN ASSISTANT:  Jari Favreessa Conte, PA-C   ANESTHESIA:   general  EBL:  Total I/O In: 250 [IV Piggyback:250] Out: 1125 [Urine:925; Blood:200]  BLOOD ADMINISTERED:none  DRAINS: 2 BLAKE DRAINS AND 1 STRAIGHT CHEST TUBE   LOCAL MEDICATIONS USED:  NONE  SPECIMEN:  Source of Specimen:  left pleural peel   DISPOSITION OF SPECIMEN:  PATHOLOGY  COUNTS:  YES  TOURNIQUET:  * No tourniquets in log *  DICTATION: .Dragon Dictation  PLAN OF CARE: Admit to inpatient   PATIENT DISPOSITION:  ICU - intubated and hemodynamically stable.   Delay start of Pharmacological VTE agent (>24hrs) due to surgical blood loss or risk of bleeding: yes

## 2017-01-13 NOTE — Anesthesia Postprocedure Evaluation (Signed)
Anesthesia Post Note  Patient: Caleb Avila  Procedure(s) Performed: Procedure(s) (LRB): VIDEO ASSISTED THORACOSCOPY (VATS) DRAIN EMPYEMA (Left)     Patient location during evaluation: PACU Anesthesia Type: General Level of consciousness: awake and alert Pain management: pain level controlled Vital Signs Assessment: post-procedure vital signs reviewed and stable Respiratory status: spontaneous breathing, nonlabored ventilation, respiratory function stable and patient connected to nasal cannula oxygen Cardiovascular status: blood pressure returned to baseline and stable Postop Assessment: no signs of nausea or vomiting Anesthetic complications: no    Last Vitals:  Vitals:   01/13/17 1841 01/13/17 1845  BP: (!) 138/99   Pulse: (!) 103 97  Resp: (!) 24 20  Temp: (!) 36.3 C     Last Pain:  Vitals:   01/13/17 1834  TempSrc:   PainSc: 7                  [name],W. EDMOND

## 2017-01-13 NOTE — Anesthesia Procedure Notes (Signed)
Procedure Name: Intubation Date/Time: 01/13/2017 3:36 PM Performed by: Ollen Bowl Pre-anesthesia Checklist: Patient identified, Emergency Drugs available, Suction available, Patient being monitored and Timeout performed Patient Re-evaluated:Patient Re-evaluated prior to induction Oxygen Delivery Method: Circle system utilized and Simple face mask Preoxygenation: Pre-oxygenation with 100% oxygen Induction Type: Combination inhalational/ intravenous induction Ventilation: Mask ventilation without difficulty Laryngoscope Size: Mac and 3 Grade View: Grade II Endobronchial tube: Left, Double lumen EBT, EBT position confirmed by auscultation and EBT position confirmed by fiberoptic bronchoscope and 39 Fr Number of attempts: 2 Airway Equipment and Method: Patient positioned with wedge pillow and Stylet Placement Confirmation: ETT inserted through vocal cords under direct vision,  positive ETCO2 and breath sounds checked- equal and bilateral Tube secured with: Tape Dental Injury: Teeth and Oropharynx as per pre-operative assessment  Difficulty Due To: Difficulty was anticipated, Difficult Airway-  due to edematous airway and Difficult Airway- due to dentition

## 2017-01-14 ENCOUNTER — Inpatient Hospital Stay (HOSPITAL_COMMUNITY): Payer: BLUE CROSS/BLUE SHIELD

## 2017-01-14 ENCOUNTER — Encounter (HOSPITAL_COMMUNITY): Payer: Self-pay | Admitting: Cardiothoracic Surgery

## 2017-01-14 LAB — POCT I-STAT 7, (LYTES, BLD GAS, ICA,H+H)
Acid-base deficit: 4 mmol/L — ABNORMAL HIGH (ref 0.0–2.0)
BICARBONATE: 21.8 mmol/L (ref 20.0–28.0)
CALCIUM ION: 1.08 mmol/L — AB (ref 1.15–1.40)
HCT: 32 % — ABNORMAL LOW (ref 39.0–52.0)
Hemoglobin: 10.9 g/dL — ABNORMAL LOW (ref 13.0–17.0)
O2 Saturation: 95 %
PCO2 ART: 43.9 mmHg (ref 32.0–48.0)
Patient temperature: 37.5
Potassium: 3.6 mmol/L (ref 3.5–5.1)
Sodium: 139 mmol/L (ref 135–145)
TCO2: 23 mmol/L (ref 0–100)
pH, Arterial: 7.306 — ABNORMAL LOW (ref 7.350–7.450)
pO2, Arterial: 88 mmHg (ref 83.0–108.0)

## 2017-01-14 LAB — GLUCOSE, CAPILLARY
GLUCOSE-CAPILLARY: 185 mg/dL — AB (ref 65–99)
GLUCOSE-CAPILLARY: 108 mg/dL — AB (ref 65–99)
GLUCOSE-CAPILLARY: 117 mg/dL — AB (ref 65–99)
GLUCOSE-CAPILLARY: 108 mg/dL — AB (ref 65–99)
Glucose-Capillary: 107 mg/dL — ABNORMAL HIGH (ref 65–99)
Glucose-Capillary: 123 mg/dL — ABNORMAL HIGH (ref 65–99)
Glucose-Capillary: 149 mg/dL — ABNORMAL HIGH (ref 65–99)

## 2017-01-14 LAB — POCT I-STAT 3, ART BLOOD GAS (G3+)
ACID-BASE EXCESS: 3 mmol/L — AB (ref 0.0–2.0)
BICARBONATE: 28.6 mmol/L — AB (ref 20.0–28.0)
O2 Saturation: 97 %
TCO2: 30 mmol/L (ref 0–100)
pCO2 arterial: 44.2 mmHg (ref 32.0–48.0)
pH, Arterial: 7.416 (ref 7.350–7.450)
pO2, Arterial: 91 mmHg (ref 83.0–108.0)

## 2017-01-14 LAB — BLOOD GAS, ARTERIAL
Acid-base deficit: 0.3 mmol/L (ref 0.0–2.0)
Bicarbonate: 22.6 mmol/L (ref 20.0–28.0)
Drawn by: 213381
FIO2: 0.21
O2 Saturation: 93.9 %
Patient temperature: 98.6
pCO2 arterial: 29.6 mmHg — ABNORMAL LOW (ref 32.0–48.0)
pH, Arterial: 7.495 — ABNORMAL HIGH (ref 7.350–7.450)
pO2, Arterial: 67 mmHg — ABNORMAL LOW (ref 83.0–108.0)

## 2017-01-14 LAB — BASIC METABOLIC PANEL
Anion gap: 8 (ref 5–15)
BUN: 10 mg/dL (ref 6–20)
CO2: 25 mmol/L (ref 22–32)
CREATININE: 0.89 mg/dL (ref 0.61–1.24)
Calcium: 8.3 mg/dL — ABNORMAL LOW (ref 8.9–10.3)
Chloride: 105 mmol/L (ref 101–111)
GFR calc Af Amer: >60 mL/min (ref >60)
GLUCOSE: 197 mg/dL — AB (ref 65–99)
POTASSIUM: 4.1 mmol/L (ref 3.5–5.1)
Sodium: 138 mmol/L (ref 135–145)

## 2017-01-14 LAB — CBC
HCT: 34.6 % — ABNORMAL LOW (ref 39.0–52.0)
Hemoglobin: 11.2 g/dL — ABNORMAL LOW (ref 13.0–17.0)
MCH: 28.6 pg (ref 26.0–34.0)
MCHC: 32.4 g/dL (ref 30.0–36.0)
MCV: 88.3 fL (ref 78.0–100.0)
PLATELETS: 349 10*3/uL (ref 150–400)
RBC: 3.92 MIL/uL — ABNORMAL LOW (ref 4.22–5.81)
RDW: 13.4 % (ref 11.5–15.5)
WBC: 16 10*3/uL — ABNORMAL HIGH (ref 4.0–10.5)

## 2017-01-14 MED ORDER — VANCOMYCIN HCL IN DEXTROSE 1-5 GM/200ML-% IV SOLN
1000.0000 mg | Freq: Three times a day (TID) | INTRAVENOUS | Status: DC
  Administered 2017-01-14 – 2017-01-18 (×12): 1000 mg via INTRAVENOUS
  Filled 2017-01-14 (×14): qty 200

## 2017-01-14 NOTE — Progress Notes (Signed)
1 Day Post-Op Procedure(s) (LRB): VIDEO ASSISTED THORACOSCOPY (VATS) DRAIN EMPYEMA (Left) Subjective: Doing well after left VATS decortication and drainage of empyema Chest x-ray significantly improved Pain adequately controlled with PCA No air leak from chest tubes  Objective: Vital signs in last 24 hours: Temp:  [97.3 F (36.3 C)-98.6 F (37 C)] 98.6 F (37 C) (07/30 1559) Pulse Rate:  [68-103] 93 (07/30 1800) Cardiac Rhythm: Normal sinus rhythm;Bundle branch block (07/30 1600) Resp:  [11-35] 27 (07/30 1800) BP: (111-146)/(74-99) 145/93 (07/30 1800) SpO2:  [94 %-100 %] 98 % (07/30 1800) Arterial Line BP: (128-201)/(73-198) 145/75 (07/30 1000) Weight:  [182 lb 12.2 oz (82.9 kg)] 182 lb 12.2 oz (82.9 kg) (07/29 1949)  Hemodynamic parameters for last 24 hours:  stable  Intake/Output from previous day: 07/29 0701 - 07/30 0700 In: 2973.3 [P.O.:30; I.V.:2308.3; IV Piggyback:550] Out: 2870 [Urine:2350; Blood:200; Chest Tube:320] Intake/Output this shift: Total I/O In: 1027 [P.O.:360; I.V.:467; IV Piggyback:200] Out: 1660 [Urine:1600; Chest Tube:60]       Exam    General- alert and comfortable   Lungs- clear without rales, wheezes   Cor- regular rate and rhythm, no murmur , gallop   Abdomen- soft, non-tender   Extremities - warm, non-tender, minimal edema   Neuro- oriented, appropriate, no focal weakness   Lab Results:  Recent Labs  01/13/17 0627 01/13/17 1644 01/14/17 0306  WBC 15.1*  --  16.0*  HGB 12.0* 10.9* 11.2*  HCT 36.7* 32.0* 34.6*  PLT 332  --  349   BMET:  Recent Labs  01/13/17 0627 01/13/17 1644 01/14/17 0306  NA 138 139 138  K 3.6 3.6 4.1  CL 106  --  105  CO2 24  --  25  GLUCOSE 101*  --  197*  BUN 12  --  10  CREATININE 1.01  --  0.89  CALCIUM 8.3*  --  8.3*    PT/INR:  Recent Labs  01/12/17 1621  LABPROT 15.0  INR 1.17   ABG    Component Value Date/Time   PHART 7.416 01/14/2017 0420   HCO3 28.6 (H) 01/14/2017 0420   TCO2 30  01/14/2017 0420   ACIDBASEDEF 1.0 01/13/2017 1951   O2SAT 97.0 01/14/2017 0420   CBG (last 3)   Recent Labs  01/14/17 0830 01/14/17 1155 01/14/17 1558  GLUCAP 123* 108* 117*    Assessment/Plan: S/P Procedure(s) (LRB): VIDEO ASSISTED THORACOSCOPY (VATS) DRAIN EMPYEMA (Left) DC A-line, mobilize Leave chest tubes in place today to suction Cultures pending still no growth Continue vancomycin and Zosyn   LOS: 7 days    Kathlee Nationseter Van Trigt III 01/14/2017

## 2017-01-14 NOTE — Progress Notes (Addendum)
Caleb Avila - Stepdown/ICU TEAM  JONERIK SLIKER  ZOX:[id number] DOB: 08-31-1960 DOA: 01/07/2017 PCP: Glori Luis, MD    Brief Narrative:  56 year old male nonsmoker presented for evaluation of sudden onset of left pleuritic chest pain. CT ruled out PE but showed loculated left effusion and probable left lower lobe pneumonia. He was admitted and placed on IV antibiotics. His symptoms did not improve, w/ ongoing fever poor appetite and fatigue. White count rose up to 17,000. A repeat CT showed a large loculated left pleural effusion with complete compression atelectasis of left lower lobe. Thoracentesis was nonproductive. TCTS ultimately took the pt for a VATS on 7/29.  Subjective: All active issues are currently being addressed by TCTS.  [facility] will continue to follow w/ you to attend to any outstanding medical issues.    Assessment & Plan:  Sepsis due to Left lower lobe pneumonia / left empyema S/p VATS 7/29 - ongoing care per TCTS  Prolonged QT Choosing meds w/ care   Chest pain in setting of pneumonia mild elevation in troponin likely due to demand ischemia - TTE with normal systolic function and no wall motion abnormalities  Essential hypertension BP currently well controlled   Mild transaminitis  Recheck in AM   DVT prophylaxis: lovenox  Code Status: FULL CODE Family Communication: no family present at time of exam  Disposition Plan:   Consultants:  TCTS PCCM  Procedures: TTE 7/29 VATS  Antimicrobials:  Rocephin 7/23 Unasyn 7/24 > 7/29 Doxycycline 7/27 > 7/29 Zosyn 7/29 > Vanc 7/29 >  Objective: Blood pressure 139/89, pulse 92, temperature 98 F (36.7 C), temperature source Oral, resp. rate 18, height 5\' 8"  (Avila.727 m), weight 82.9 kg (182 lb 12.2 oz), SpO2 98 %.  Intake/Output Summary (Last 24 hours) at 01/14/17 0954 Last data filed at 01/14/17 0900  Gross per 24 hour  Intake          3145.33 ml  Output             2495 ml  Net            650.33 ml   [facility] Weights   01/11/17 2137 01/12/17 2045 01/13/17 1949  Weight: 83.9 kg (185 lb) 81.3 kg (179 lb 3.2 oz) 82.9 kg (182 lb 12.2 oz)    Examination: No exam today   CBC:  Recent Labs Lab 01/09/17 0623 01/11/17 0358 01/12/17 0338 01/12/17 1621 01/13/17 0627 01/13/17 1644 01/14/17 0306  WBC 17.0* 15.7* 16.0* 15.5* 15.Avila*  --  16.0*  NEUTROABS 13.Avila*  --   --   --   --   --   --   HGB 14.Avila 13.7 12.4* 13.2 12.0* 10.9* 11.2*  HCT 42.2 40.6 37.8* 39.5 36.7* 32.0* 34.6*  MCV 87.6 86.0 87.5 86.4 86.2  --  88.3  PLT 241 301 265 355 332  --  349   Basic Metabolic Panel:  Recent Labs Lab 01/09/17 0623 01/11/17 0358 01/12/17 1621 01/13/17 0627 01/13/17 1644 01/14/17 0306  NA 140 140 139 138 139 138  K 3.6 4.2 3.9 3.6 3.6 4.Avila  CL 107 106 105 106  --  105  CO2 23 21* 25 24  --  25  GLUCOSE 112* 100* 103* 101*  --  197*  BUN 12 12 12 12   --  10  CREATININE Avila.19 Avila.06 Avila.18 Avila.01  --  0.89  CALCIUM 8.5* 8.4* 8.5* 8.3*  --  8.3*  MG Avila.8  --   --   --   --   --  GFR: Estimated Creatinine Clearance: 100.7 mL/min (by C-G formula based on SCr of 0.89 mg/dL).  Liver Function Tests:  Recent Labs Lab 01/09/17 0623 01/12/17 1621  AST 23 86*  ALT 23 87*  ALKPHOS 76 127*  BILITOT Avila.0 0.7  PROT 6.7 7.0  ALBUMIN 2.7* 2.6*    Coagulation Profile:  Recent Labs Lab 01/12/17 1621  INR Avila.17    Cardiac Enzymes:  Recent Labs Lab 01/07/17 2044 01/08/17 0224 01/08/17 0725 01/09/17 0623  TROPONINI <0.03 0.08* <0.03 0.05*    HbA1C: Hgb A1c MFr Bld  Date/Time Value Ref Range Status  11/25/2015 09:05 AM 5.5 4.6 - 6.5 % Final    Comment:    Glycemic Control Guidelines for People with Diabetes:Non Diabetic:  <6%Goal of Therapy: <7%Additional Action Suggested:  >8%     CBG:  Recent Labs Lab 01/13/17 2133 01/13/17 2356 01/14/17 0318 01/14/17 0830  GLUCAP 151* 149* 185* 123*    Recent Results (from the past 240 hour(s))  Culture, blood (x 2)      Status: None   Collection Time: 01/07/17  8:40 PM  Result Value Ref Range Status   Specimen Description BLOOD RIGHT ANTECUBITAL  Final   Special Requests   Final    BOTTLES DRAWN AEROBIC AND ANAEROBIC Blood Culture adequate volume   Culture NO GROWTH 5 DAYS  Final   Report Status 01/12/2017 FINAL  Final  Culture, blood (x 2)     Status: None   Collection Time: 01/07/17  8:45 PM  Result Value Ref Range Status   Specimen Description BLOOD RIGHT HAND  Final   Special Requests   Final    BOTTLES DRAWN AEROBIC AND ANAEROBIC Blood Culture adequate volume   Culture NO GROWTH 5 DAYS  Final   Report Status 01/12/2017 FINAL  Final  Culture, sputum-assessment     Status: None   Collection Time: 01/08/17  6:15 AM  Result Value Ref Range Status   Specimen Description SPUTUM  Final   Special Requests NONE  Final   Sputum evaluation   Final    Sputum specimen not acceptable for testing.  Please recollect.   Gram Stain Report Called to,Read Back By and Verified With: A. MABE RN, [phone] 01/08/17 BY D. Leighton RoachVANHOOK    Report Status 01/08/2017 FINAL  Final  Respiratory Panel by PCR     Status: None   Collection Time: 01/08/17  9:53 AM  Result Value Ref Range Status   Adenovirus NOT DETECTED NOT DETECTED Final   Coronavirus 229E NOT DETECTED NOT DETECTED Final   Coronavirus HKU1 NOT DETECTED NOT DETECTED Final   Coronavirus NL63 NOT DETECTED NOT DETECTED Final   Coronavirus OC43 NOT DETECTED NOT DETECTED Final   Metapneumovirus NOT DETECTED NOT DETECTED Final   Rhinovirus / Enterovirus NOT DETECTED NOT DETECTED Final   Influenza A NOT DETECTED NOT DETECTED Final   Influenza B NOT DETECTED NOT DETECTED Final   Parainfluenza Virus Avila NOT DETECTED NOT DETECTED Final   Parainfluenza Virus 2 NOT DETECTED NOT DETECTED Final   Parainfluenza Virus 3 NOT DETECTED NOT DETECTED Final   Parainfluenza Virus 4 NOT DETECTED NOT DETECTED Final   Respiratory Syncytial Virus NOT DETECTED NOT DETECTED Final    Bordetella pertussis NOT DETECTED NOT DETECTED Final   Chlamydophila pneumoniae NOT DETECTED NOT DETECTED Final   Mycoplasma pneumoniae NOT DETECTED NOT DETECTED Final  MRSA PCR Screening     Status: None   Collection Time: 01/11/17 11:41 AM  Result Value Ref  Range Status   MRSA by PCR NEGATIVE NEGATIVE Final    Comment:        The GeneXpert MRSA Assay (FDA approved for NASAL specimens only), is one component of a comprehensive MRSA colonization surveillance program. It is not intended to diagnose MRSA infection nor to guide or monitor treatment for MRSA infections.   Surgical pcr screen     Status: Abnormal   Collection Time: 01/12/17  4:42 PM  Result Value Ref Range Status   MRSA, PCR NEGATIVE NEGATIVE Final   Staphylococcus aureus POSITIVE (A) NEGATIVE Final    Comment:        The Xpert SA Assay (FDA approved for NASAL specimens in patients over 54 years of age), is one component of a comprehensive surveillance program.  Test performance has been validated by [facility] for patients greater than or equal to 54 year old. It is not intended to diagnose infection nor to guide or monitor treatment.   Aerobic/Anaerobic Culture (surgical/deep wound)     Status: None (Preliminary result)   Collection Time: 01/13/17  4:04 PM  Result Value Ref Range Status   Specimen Description LUNG LEFT TISSUE  Final   Special Requests SWAB SPEC A  Final   Gram Stain   Final    MODERATE WBC PRESENT,BOTH PMN AND MONONUCLEAR NO ORGANISMS SEEN    Culture PENDING  Incomplete   Report Status PENDING  Incomplete  Aerobic/Anaerobic Culture (surgical/deep wound)     Status: None (Preliminary result)   Collection Time: 01/13/17  4:08 PM  Result Value Ref Range Status   Specimen Description PLEURAL LEFT FLUID  Final   Special Requests SPEC B  Final   Gram Stain   Final    RARE WBC PRESENT, PREDOMINANTLY PMN NO ORGANISMS SEEN    Culture PENDING  Incomplete   Report Status PENDING   Incomplete     Scheduled Meds: . acetaminophen  Avila,000 mg Oral Q6H   Or  . acetaminophen (TYLENOL) oral liquid 160 mg/5 mL  Avila,000 mg Oral Q6H  . aspirin EC  325 mg Oral Daily  . bisacodyl  10 mg Oral Daily  . enoxaparin (LOVENOX) injection  40 mg Subcutaneous Daily  . fentaNYL   Intravenous Q4H  . insulin aspart  0-24 Units Subcutaneous Q4H  . mouth rinse  15 mL Mouth Rinse BID  . metoCLOPramide (REGLAN) injection  10 mg Intravenous Q6H  . mupirocin ointment  Avila application Nasal BID  . pantoprazole (PROTONIX) IV  40 mg Intravenous Q24H  . senna-docusate  Avila tablet Oral QHS     LOS: 7 days   Lonia BloodJeffrey T. Grisell Bissette, MD [facility] Office  [phone] Pager - Text Page per Amion as per below:  On-Call/Text Page:      [email or web address]      password [email or web address]  If 7PM-7AM, please contact night-coverage [email or web address] Password [email or web address] 01/14/2017, 9:54 AM

## 2017-01-14 NOTE — Progress Notes (Signed)
Pharmacy Antibiotic Note  Caleb Avila is a 56 y.o. male admitted on 01/07/2017 with empyema.  Pharmacy has been consulted to broaden antibiotics to vancomycin and Zosyn post VATS.   Patient's renal function is improving.  Tmax 100.4 and WBC trended up post procedure.   Plan: Change vanc to 1gm IV Q8H Continue Zosyn 3.375g IV Q8H, 4 hr infusion Monitor renal fxn, clinical progress, vanc trough at Css   Height: 5\' 8"  (172.7 cm) Weight: 182 lb 12.2 oz (82.9 kg) IBW/kg (Calculated) : 68.4  Temp (24hrs), Avg:98.1 F (36.7 C), Min:97.3 F (36.3 C), Max:100.4 F (38 C)   Recent Labs Lab 01/07/17 2044 01/07/17 2229 01/09/17 0623 01/11/17 0358 01/12/17 0338 01/12/17 1621 01/13/17 0627 01/14/17 0306  WBC  --   --  17.0* 15.7* 16.0* 15.5* 15.1* 16.0*  CREATININE  --   --  1.19 1.06  --  1.18 1.01 0.89  LATICACIDVEN 0.9 1.3  --   --   --   --   --   --     Estimated Creatinine Clearance: 100.7 mL/min (by C-G formula based on SCr of 0.89 mg/dL).    No Known Allergies  CTX 7/23 x1 Doxy 7/23 >> 7/24; 7/27>>7/29 Unasyn 7/24 >>7/29 Zosyn 7/29>> Vanc 7/29>>  7/23 BCx: neg 7/24 resp panel: neg 7/24 sputum: not acceptable 7/24 strep pneumo: neg 7/27 MRSA PCR: neg 7/28 MRSA PCR: neg, staph aureus: pos 7/29 pleural fluid A: 7/29 pleural fluid B: 7/29 lung tissue AFB: 7/29 lung tissue fungus:    Paradise Vensel D. Laney Potashang, PharmD, BCPS Pager:  [phone] 01/14/2017, 8:54 AM

## 2017-01-14 NOTE — Op Note (Signed)
NAME:  Caleb Avila, Shaheem                 ACCOUNT NO.:  MEDICAL RECORD NO.:  [id number]  LOCATION:                                 FACILITY:  PHYSICIAN:  Kerin PernaPeter Van Trigt, M.D.       DATE OF BIRTH:  DATE OF PROCEDURE:  01/13/2017 DATE OF DISCHARGE:                              OPERATIVE REPORT   PREOPERATIVE DIAGNOSES:  Left lower lobe pneumonia with empyema.  POSTOPERATIVE DIAGNOSES:  Left lower lobe pneumonia with empyema.  OPERATION: 1. Left VATS-mini thoracotomy for drainage of left empyema. 2. Decortication of the left lower lobe.  SURGEON:  Kerin PernaPeter Van Trigt, M.D.  ASSISTANT:  Jari Favreessa Conte, PA-C.  ANESTHESIA:  General by Dr. Autumn PattyEdmond Fitzgerald.  CLINICAL NOTE:  The patient is a 56 year old nonsmoker who developed pulmonary illness about 10 days previously.  He had sudden increase in left pleuritic chest pain and shortness of breath and presented for evaluation.  A chest x-ray demonstrated left lower lobe pneumonia with effusion.  He was admitted to the hospital.  A second CT scan during the hospitalization showed significant increase in his left pleural effusion despite IV antibiotics.  An attempted thoracentesis returned no fluid. He developed increased white count, increased heart rate, and a temperature 100.5.  Thoracic surgical evaluation was requested and I recommended left VATS and drainage of empyema.  I felt this was an urgent situation because of the rapidly increasing collapse of the left lung and accumulation of loculated fluid in the left pleural space.  I discussed the procedure, left VATS and drainage of empyema with the patient including discussing the use of general anesthesia, the location of the surgical incision, and the expected postoperative recovery using chest tube drains and ICU care.  He understood the risks of bleeding, prolonged air leak, recurrent infection, postoperative pain, and death. He agreed to proceed under what I felt was an informed  consent.  OPERATIVE FINDINGS: 1. Significant collapse of the left lower lobe with a fibropurulent     peel which was removed.  Loculated multi-pocketed empyema which was     removed and the fluid all drained and then irrigated. 2. Tissue and fluid were sent for cultures as well as pathology.  OPERATIVE PROCEDURE:  The patient was brought to the operating room and placed supine on the operating table.  General anesthesia was induced. A double-lumen endotracheal tube was placed by the anesthesia team.  The patient was turned left side up and prepped and draped as a sterile field.  A proper time-out was performed.  A small incision was made at the tip of the scapula in the fifth interspace.  A thoracoscope was inserted.  There was poor visibility to obliteration of the pleural space with adhesions and inflammation and infection.  The incision was extended approximately 3 inches.  The ribs were gently spread.  Slowly, the pleural space was re-produced by removing the adhesions.  The fibrinous and purulent material on the lungs and parietal pleura as well as the peel on the visceral pleura of the left lower lobe.  The entire lung was mobilized and the pleural space was irrigated.  The scope was used to identify pockets  of solid material and to help place the 3 chest tubes anteriorly, subphrenic, and along the paravertebral gutter.  After the decortication and drainage was completed, a pericostal suture of #2 Vicryl was used to reapproximate the ribs.  Prior to tying this down, the left lung was re-expanded under direct vision and re-expanded well.  The incision was closed in layers using interrupted #1 Vicryl for the muscle layer.  Running 2-0 Vicryl for the subcutaneous fascia layer and running 3-0 Vicryl for the skin.  The chest tubes were connected to Pleur-Evac drainage system and the patient was turned supine and the plan is for extubation in the operating room, then return to  the recovery room.     Kerin PernaPeter Van Trigt, M.D.     PV/MEDQ  D:  01/13/2017  T:  01/13/2017  Job:  [id number]

## 2017-01-14 NOTE — Care Management Note (Addendum)
Case Management Note  Patient Details  Name: Caleb Avila MRN: [id number] Date of Birth: 07/08/1960  Subjective/Objective:   From home, lives with his Mom, pta indep, He states he has a PCP and he has medication coverage. POD 1 L VATS, Decortication. conts with chest tubes to suction, pca, iv abx, Discussed in LOS 7/31, appropriate for continued stay.                  Action/Plan: NCM will follow for dc needs.   Expected Discharge Date:                  Expected Discharge Plan:     In-House Referral:     Discharge planning Services  CM Consult  Post Acute Care Choice:    Choice offered to:     DME Arranged:    DME Agency:     HH Arranged:    HH Agency:     Status of Service:  In process, will continue to follow  If discussed at Long Length of Stay Meetings, dates discussed:    Additional Comments:  [name], [name], RN 01/14/2017, 3:51 PM

## 2017-01-14 NOTE — Progress Notes (Signed)
      [address]       [place] [id number]             [phone]      POD # 1 drainage of empyema, decortication  BP (!) 141/90 (BP Location: Left Arm)   Pulse 83   Temp 98.6 F (37 C) (Oral)   Resp (!) 31   Ht 5\' 8"  (1.727 m)   Wt 182 lb 12.2 oz (82.9 kg)   SpO2 99%   BMI 27.79 kg/m   Intake/Output Summary (Last 24 hours) at 01/14/17 1809 Last data filed at 01/14/17 1600  Gross per 24 hour  Intake          2220.33 ml  Output             3295 ml  Net         -1074.67 ml   Doing well POD # 1  Renell Coaxum C. Dorris FetchHendrickson, MD [facility] [phone]

## 2017-01-15 ENCOUNTER — Inpatient Hospital Stay (HOSPITAL_COMMUNITY): Payer: BLUE CROSS/BLUE SHIELD

## 2017-01-15 ENCOUNTER — Other Ambulatory Visit: Payer: Self-pay | Admitting: Family Medicine

## 2017-01-15 DIAGNOSIS — R079 Chest pain, unspecified: Secondary | ICD-10-CM

## 2017-01-15 DIAGNOSIS — R9431 Abnormal electrocardiogram [ECG] [EKG]: Secondary | ICD-10-CM

## 2017-01-15 DIAGNOSIS — E876 Hypokalemia: Secondary | ICD-10-CM

## 2017-01-15 DIAGNOSIS — I10 Essential (primary) hypertension: Secondary | ICD-10-CM

## 2017-01-15 LAB — BPAM RBC
Blood Product Expiration Date: 201808292359
Blood Product Expiration Date: 201808292359
Unit Type and Rh: 5100
Unit Type and Rh: 5100

## 2017-01-15 LAB — COMPREHENSIVE METABOLIC PANEL
ALT: 66 U/L — AB (ref 17–63)
AST: 38 U/L (ref 15–41)
Albumin: 2.4 g/dL — ABNORMAL LOW (ref 3.5–5.0)
Alkaline Phosphatase: 89 U/L (ref 38–126)
Anion gap: 8 (ref 5–15)
BUN: 9 mg/dL (ref 6–20)
CHLORIDE: 104 mmol/L (ref 101–111)
CO2: 26 mmol/L (ref 22–32)
CREATININE: 0.98 mg/dL (ref 0.61–1.24)
Calcium: 8.3 mg/dL — ABNORMAL LOW (ref 8.9–10.3)
GFR calc Af Amer: >60 mL/min (ref >60)
Glucose, Bld: 116 mg/dL — ABNORMAL HIGH (ref 65–99)
POTASSIUM: 3.4 mmol/L — AB (ref 3.5–5.1)
SODIUM: 138 mmol/L (ref 135–145)
Total Bilirubin: 0.7 mg/dL (ref 0.3–1.2)
Total Protein: 6.2 g/dL — ABNORMAL LOW (ref 6.5–8.1)

## 2017-01-15 LAB — TYPE AND SCREEN
ABO/RH(D): O POS
Antibody Screen: NEGATIVE
Unit division: 0
Unit division: 0

## 2017-01-15 LAB — GLUCOSE, CAPILLARY
GLUCOSE-CAPILLARY: 120 mg/dL — AB (ref 65–99)
GLUCOSE-CAPILLARY: 101 mg/dL — AB (ref 65–99)
Glucose-Capillary: 103 mg/dL — ABNORMAL HIGH (ref 65–99)

## 2017-01-15 LAB — POTASSIUM: Potassium: 3.8 mmol/L (ref 3.5–5.1)

## 2017-01-15 LAB — CBC
HCT: 34.9 % — ABNORMAL LOW (ref 39.0–52.0)
Hemoglobin: 11.3 g/dL — ABNORMAL LOW (ref 13.0–17.0)
MCH: 28.2 pg (ref 26.0–34.0)
MCHC: 32.4 g/dL (ref 30.0–36.0)
MCV: 87 fL (ref 78.0–100.0)
PLATELETS: 397 10*3/uL (ref 150–400)
RBC: 4.01 MIL/uL — ABNORMAL LOW (ref 4.22–5.81)
RDW: 12.9 % (ref 11.5–15.5)
WBC: 13.3 10*3/uL — AB (ref 4.0–10.5)

## 2017-01-15 LAB — VANCOMYCIN, TROUGH: Vancomycin Tr: 22 ug/mL (ref 15–20)

## 2017-01-15 LAB — MAGNESIUM: Magnesium: 2.2 mg/dL (ref 1.7–2.4)

## 2017-01-15 LAB — ACID FAST SMEAR (AFB, MYCOBACTERIA): Acid Fast Smear: NEGATIVE

## 2017-01-15 MED ORDER — DEXTROSE-NACL 5-0.45 % IV SOLN: INTRAVENOUS | Status: DC

## 2017-01-15 MED ORDER — ENSURE ENLIVE PO LIQD
237.0000 mL | Freq: Three times a day (TID) | ORAL | Status: DC
  Administered 2017-01-15 – 2017-01-20 (×15): 237 mL via ORAL

## 2017-01-15 MED ORDER — POTASSIUM CHLORIDE 10 MEQ/100ML IV SOLN
10.0000 meq | INTRAVENOUS | Status: DC
  Administered 2017-01-15: 10 meq via INTRAVENOUS
  Filled 2017-01-15: qty 100

## 2017-01-15 MED ORDER — POTASSIUM CHLORIDE 10 MEQ/100ML IV SOLN
INTRAVENOUS | Status: AC
  Filled 2017-01-15: qty 100

## 2017-01-15 MED ORDER — POTASSIUM CHLORIDE 10 MEQ/100ML IV SOLN
10.0000 meq | INTRAVENOUS | Status: AC
  Administered 2017-01-15 (×4): 10 meq via INTRAVENOUS
  Filled 2017-01-15 (×3): qty 100

## 2017-01-15 NOTE — Progress Notes (Signed)
TCTS BRIEF SICU PROGRESS NOTE  2 Days Post-Op  S/P Procedure(s) (LRB): VIDEO ASSISTED THORACOSCOPY (VATS) DRAIN EMPYEMA (Left)   Stable day  Plan: Continue current plan  Purcell Nailslarence H Carmesha Morocco, MD 01/15/2017 5:03 PM

## 2017-01-15 NOTE — Progress Notes (Addendum)
TCTS DAILY ICU PROGRESS NOTE                   [address]            [place] [id number]          [phone]   2 Days Post-Op Procedure(s) (LRB): VIDEO ASSISTED THORACOSCOPY (VATS) DRAIN EMPYEMA (Left)  Total Length of Stay:  LOS: 8 days   Subjective: Patient hurting bad this am. He denies nausea or vomiting.  Objective: Vital signs in last 24 hours: Temp:  [97.6 F (36.4 C)-98.6 F (37 C)] 97.6 F (36.4 C) (07/31 0300) Pulse Rate:  [77-94] 89 (07/31 0700) Cardiac Rhythm: Normal sinus rhythm;Bundle branch block (07/31 0400) Resp:  [12-31] 29 (07/31 0700) BP: (116-157)/(78-98) 157/97 (07/31 0700) SpO2:  [93 %-100 %] 93 % (07/31 0700) Arterial Line BP: (141-145)/(75-89) 145/75 (07/30 1000)  Filed Weights   01/11/17 2137 01/12/17 2045 01/13/17 1949  Weight: 83.9 kg (185 lb) 81.3 kg (179 lb 3.2 oz) 82.9 kg (182 lb 12.2 oz)       Intake/Output from previous day: 07/30 0701 - 07/31 0700 In: 1487 [P.O.:360; I.V.:627; IV Piggyback:500] Out: 2285 [Urine:2225; Chest Tube:60]  Intake/Output this shift: Total I/O In: 490.5 [I.V.:240.5; IV Piggyback:250] Out: -   Current Meds: Scheduled Meds: . acetaminophen  1,000 mg Oral Q6H   Or  . acetaminophen (TYLENOL) oral liquid 160 mg/5 mL  1,000 mg Oral Q6H  . aspirin EC  325 mg Oral Daily  . bisacodyl  10 mg Oral Daily  . enoxaparin (LOVENOX) injection  40 mg Subcutaneous Daily  . fentaNYL   Intravenous Q4H  . insulin aspart  0-24 Units Subcutaneous Q4H  . mouth rinse  15 mL Mouth Rinse BID  . metoCLOPramide (REGLAN) injection  10 mg Intravenous Q6H  . mupirocin ointment  1 application Nasal BID  . pantoprazole (PROTONIX) IV  40 mg Intravenous Q24H  . senna-docusate  1 tablet Oral QHS   Continuous Infusions: . dextrose 5 % and 0.45% NaCl 30 mL/hr at 01/14/17 2300  . piperacillin-tazobactam (ZOSYN)  IV 3.375 g (01/15/17 0701)  . potassium chloride    . potassium chloride    . vancomycin Stopped  (01/15/17 0334)   PRN Meds:.diphenhydrAMINE **OR** diphenhydrAMINE, fentaNYL (SUBLIMAZE) injection, levalbuterol, naloxone **AND** sodium chloride flush, ondansetron (ZOFRAN) IV, oxyCODONE, potassium chloride, traMADol  Heart: RRR Lungs: Slightly diminished on left and clear on the right Extremities: No LE edema Wound: Clean and dry  Lab Results: CBC: Recent Labs  01/14/17 0306 01/15/17 0218  WBC 16.0* 13.3*  HGB 11.2* 11.3*  HCT 34.6* 34.9*  PLT 349 397   BMET:  Recent Labs  01/14/17 0306 01/15/17 0218  NA 138 138  K 4.1 3.4*  CL 105 104  CO2 25 26  GLUCOSE 197* 116*  BUN 10 9  CREATININE 0.89 0.98  CALCIUM 8.3* 8.3*    CMET: Lab Results  Component Value Date   WBC 13.3 (H) 01/15/2017   HGB 11.3 (L) 01/15/2017   HCT 34.9 (L) 01/15/2017   PLT 397 01/15/2017   GLUCOSE 116 (H) 01/15/2017   CHOL 153 11/25/2015   TRIG 106.0 11/25/2015   HDL 35.50 (L) 11/25/2015   LDLCALC 96 11/25/2015   ALT 66 (H) 01/15/2017   AST 38 01/15/2017   NA 138 01/15/2017   K 3.4 (L) 01/15/2017   CL 104 01/15/2017   CREATININE 0.98 01/15/2017   BUN 9 01/15/2017   CO2 26  01/15/2017   TSH 1.01 11/25/2015   PSA 3.08 11/25/2015   INR 1.17 01/12/2017   HGBA1C 5.5 11/25/2015      PT/INR:  Recent Labs  01/12/17 1621  LABPROT 15.0  INR 1.17   Radiology: Dg Chest Port 1 View  Result Date: 01/15/2017 CLINICAL DATA:  Shortness of breath. EXAM: PORTABLE CHEST 1 VIEW COMPARISON:  Multiple prior chest x-rays, most recently dated yesterday. FINDINGS: The cardiomediastinal silhouette remains mildly enlarged. Left-sided chest tubes again noted. No definite left-sided pneumothorax. Small left pleural effusion and adjacent atelectasis, unchanged. The right lung is clear. No acute osseous abnormality. IMPRESSION: No significant residual left pneumothorax. Unchanged left-sided chest tubes. Electronically Signed   By: Obie DredgeWilliam T Derry M.D.   On: 01/15/2017 07:40    Assessment/Plan: S/P  Procedure(s) (LRB): VIDEO ASSISTED THORACOSCOPY (VATS) DRAIN EMPYEMA (Left)  1. CV-SR in the 90's. 2. Pulmonary-3 chest tubes to suction. 60 cc of output last 24 hours?  CXR this am shows no pneumothorax, small left pleural effusion and atelectasis. Per Dr. Donata ClayVan Trigt, will remove anterior chest tube.Encourage incentive spirometer. 3. Continue with PCA, Oxy, Ultram for pain control. 4. ID-on Vancomycin and Zosyn for PNA 5. Per Dr. Donata ClayVan Trigt, transfer to [facility]   Murrell Dome M PA-C 01/15/2017 8:17 AM

## 2017-01-15 NOTE — Consult Note (Signed)
Medical Consultation   Caleb Avila  ZOX:[id number]  DOB: May 27, 1961  DOA: 01/07/2017  PCP: Glori Luis, MD    Requesting physician: Dr. Kathlee Nations Trigt Cardiothoracic surgery  Reason for consultation: Management medical problems   History of Present Illness: Caleb Avila is an 56 y.o. WM PMHx Depression, HTN, Frequent HA, nonsmoker   Presented for evaluation of sudden onset of left pleuritic chest pain. CT ruled out PE but showed loculated left effusion and probable left lower lobe pneumonia. He was admitted and placed on IV antibiotics. His symptoms did not improve, w/ ongoing fever poor appetite and fatigue. White count rose up to 17,000. A repeat CT showed a large loculated left pleural effusion with complete compression atelectasis of left lower lobe. Thoracentesis was nonproductive. TCTS ultimately took the pt for a VATS on 7/29.     Review of Systems:  Review of Systems  Constitutional: Negative.   Respiratory: Positive for cough. Negative for hemoptysis, sputum production, shortness of breath and wheezing.   Cardiovascular: Negative.   Gastrointestinal: Negative.   Musculoskeletal: Negative.   Skin: Negative.   Psychiatric/Behavioral: Negative.      Past Medical History: Past Medical History:  Diagnosis Date  . Depression   . Frequent headaches   . Hypertension     Past Surgical History: Past Surgical History:  Procedure Laterality Date  . chest wall cyst    . FINGER SURGERY Right   . VIDEO ASSISTED THORACOSCOPY (VATS)/EMPYEMA Left 01/13/2017   Procedure: VIDEO ASSISTED THORACOSCOPY (VATS) DRAIN EMPYEMA;  Surgeon: Kerin Perna, MD;  Location: [facility] OR;  Service: Thoracic;  Laterality: Left;     Allergies:  No Known Allergies   Social History:  reports that he has never smoked. He has never used smokeless tobacco. He reports that he does not drink alcohol or use drugs.   Family History: Family History  Problem  Relation Age of Onset  . Depression Unknown       Procedures/Significant Events:  7/25 Echocardiogram:Left ventricle: The cavity size was normal. There was mild focal   basal hypertrophy of the septum. LVEF = 60% to 65%. - Ventricular septum: Septal motion showed severe paradox. C/W  intraventricular conduction delay. - Atrial septum: Increased thickness of the septum, c/w lipomatous hypertrophy. 7/29 Left VATS-mini thoracotomy for drainage of left empyema.- Decortication of the left lower lobe   VENTILATOR SETTINGS: None  Cultures 7/23 blood negative final 7/24 sputum unacceptable specimen 7/27 MRSA PCR negative 7/29 left lung tissue NGTD 7/29 left pleural fluid NGTD     Antimicrobials: Anti-infectives    Start     Stop   01/14/17 1000  vancomycin (VANCOCIN) IVPB 1000 mg/200 mL premix         01/14/17 0100  vancomycin (VANCOCIN) IVPB 1000 mg/200 mL premix  Status:  Discontinued     01/14/17 0855   01/13/17 2030  piperacillin-tazobactam (ZOSYN) IVPB 3.375 g         01/13/17 0600  vancomycin (VANCOCIN) IVPB 1000 mg/200 mL premix     01/13/17 1625   01/11/17 1230  doxycycline (VIBRAMYCIN) 100 mg in dextrose 5 % 250 mL IVPB  Status:  Discontinued     01/13/17 1357   01/08/17 1100  Ampicillin-Sulbactam (UNASYN) 3 g in sodium chloride 0.9 % 100 mL IVPB  Status:  Discontinued     01/13/17 1933   01/07/17 2100  doxycycline (VIBRAMYCIN) 100 mg  in dextrose 5 % 250 mL IVPB  Status:  Discontinued     01/08/17 1009   01/07/17 2000  cefTRIAXone (ROCEPHIN) 1 g in dextrose 5 % 50 mL IVPB  Status:  Discontinued     01/08/17 1009   01/07/17 2000  azithromycin (ZITHROMAX) 500 mg in dextrose 5 % 250 mL IVPB  Status:  Discontinued     01/07/17 2015   01/07/17 1930  cefTRIAXone (ROCEPHIN) 1 g in dextrose 5 % 50 mL IVPB  Status:  Discontinued     01/07/17 1950   01/07/17 1930  azithromycin (ZITHROMAX) 500 mg in dextrose 5 % 250 mL IVPB  Status:  Discontinued     01/07/17 1951        Devices     LINES / TUBES:  Left chest tube 3, 7/29>>     Continuous Infusions: . dextrose 5 % and 0.45% NaCl 30 mL/hr at 01/14/17 2300  . piperacillin-tazobactam (ZOSYN)  IV 3.375 g (01/15/17 0701)  . potassium chloride    . vancomycin Stopped (01/15/17 0334)     Physical Exam: Vitals:   01/15/17 0200 01/15/17 0300 01/15/17 0400 01/15/17 0500  BP: 116/79  120/82 137/89  Pulse: 80   77  Resp: 15  12 15   Temp:  97.6 F (36.4 C)    TempSrc:  Oral    SpO2: 96%  96% 96%  Weight:      Height:        General: A/O 4, positive acute respiratory distress Eyes: negative scleral hemorrhage, negative anisocoria, negative icterus  Lungs: right lung fields Clear to auscultation, LLL clear to auscultation, LUL rhonchi, negative wheezing bilateral Cardiovascular: Tachycardic, with occasional PVC, without murmur gallop or rub normal S1 and S2, left chest tube draining serosanguineous fluid covered and clean. Abdomen: negative abdominal pain, nondistended, positive soft, bowel sounds, no rebound, no ascites, no appreciable mass Extremities: No significant cyanosis, clubbing, or edema bilateral lower extremities Psychiatric:  Negative depression, negative anxiety, negative fatigue, negative mania  Central nervous system:  Cranial nerves II through XII intact, tongue/uvula midline, all extremities muscle strength 5/5, sensation intact throughout,  negative dysarthria, negative expressive aphasia, negative receptive aphasia.     Data reviewed:  I have personally reviewed following labs and imaging studies Labs:  CBC:  Recent Labs Lab 01/09/17 0623  01/12/17 0338 01/12/17 1621 01/13/17 0627 01/13/17 1644 01/14/17 0306 01/15/17 0218  WBC 17.0*  < > 16.0* 15.5* 15.1*  --  16.0* 13.3*  NEUTROABS 13.1*  --   --   --   --   --   --   --   HGB 14.1  < > 12.4* 13.2 12.0* 10.9* 11.2* 11.3*  HCT 42.2  < > 37.8* 39.5 36.7* 32.0* 34.6* 34.9*  MCV 87.6  < > 87.5 86.4 86.2  --   88.3 87.0  PLT 241  < > 265 355 332  --  349 397  < > = values in this interval not displayed.  Basic Metabolic Panel:  Recent Labs Lab 01/09/17 0623 01/11/17 0358 01/12/17 1621 01/13/17 0627 01/13/17 1644 01/14/17 0306 01/15/17 0218  NA 140 140 139 138 139 138 138  K 3.6 4.2 3.9 3.6 3.6 4.1 3.4*  CL 107 106 105 106  --  105 104  CO2 23 21* 25 24  --  25 26  GLUCOSE 112* 100* 103* 101*  --  197* 116*  BUN 12 12 12 12   --  10 9  CREATININE 1.19  1.06 1.18 1.01  --  0.89 0.98  CALCIUM 8.5* 8.4* 8.5* 8.3*  --  8.3* 8.3*  MG 1.8  --   --   --   --   --   --    GFR Estimated Creatinine Clearance: 91.5 mL/min (by C-G formula based on SCr of 0.98 mg/dL). Liver Function Tests:  Recent Labs Lab 01/09/17 0623 01/12/17 1621 01/15/17 0218  AST 23 86* 38  ALT 23 87* 66*  ALKPHOS 76 127* 89  BILITOT 1.0 0.7 0.7  PROT 6.7 7.0 6.2*  ALBUMIN 2.7* 2.6* 2.4*   No results for input(s): LIPASE, AMYLASE in the last 168 hours. No results for input(s): AMMONIA in the last 168 hours. Coagulation profile  Recent Labs Lab 01/12/17 1621  INR 1.17    Cardiac Enzymes:  Recent Labs Lab 01/08/17 0725 01/09/17 0623  TROPONINI <0.03 0.05*   BNP: Invalid input(s): POCBNP CBG:  Recent Labs Lab 01/14/17 1155 01/14/17 1558 01/14/17 2003 01/14/17 2335 01/15/17 0352  GLUCAP 108* 117* 108* 107* 120*   D-Dimer No results for input(s): DDIMER in the last 72 hours. Hgb A1c No results for input(s): HGBA1C in the last 72 hours. Lipid Profile No results for input(s): CHOL, HDL, LDLCALC, TRIG, CHOLHDL, LDLDIRECT in the last 72 hours. Thyroid function studies No results for input(s): TSH, T4TOTAL, T3FREE, THYROIDAB in the last 72 hours.  Invalid input(s): FREET3 Anemia work up No results for input(s): VITAMINB12, FOLATE, FERRITIN, TIBC, IRON, RETICCTPCT in the last 72 hours. Urinalysis    Component Value Date/Time   COLORURINE YELLOW 01/12/2017 2053   APPEARANCEUR CLEAR  01/12/2017 2053   LABSPEC 1.018 01/12/2017 2053   PHURINE 6.0 01/12/2017 2053   GLUCOSEU NEGATIVE 01/12/2017 2053   HGBUR NEGATIVE 01/12/2017 2053   BILIRUBINUR NEGATIVE 01/12/2017 2053   KETONESUR NEGATIVE 01/12/2017 2053   PROTEINUR NEGATIVE 01/12/2017 2053   NITRITE NEGATIVE 01/12/2017 2053   LEUKOCYTESUR NEGATIVE 01/12/2017 2053     Microbiology Recent Results (from the past 240 hour(s))  Culture, blood (x 2)     Status: None   Collection Time: 01/07/17  8:40 PM  Result Value Ref Range Status   Specimen Description BLOOD RIGHT ANTECUBITAL  Final   Special Requests   Final    BOTTLES DRAWN AEROBIC AND ANAEROBIC Blood Culture adequate volume   Culture NO GROWTH 5 DAYS  Final   Report Status 01/12/2017 FINAL  Final  Culture, blood (x 2)     Status: None   Collection Time: 01/07/17  8:45 PM  Result Value Ref Range Status   Specimen Description BLOOD RIGHT HAND  Final   Special Requests   Final    BOTTLES DRAWN AEROBIC AND ANAEROBIC Blood Culture adequate volume   Culture NO GROWTH 5 DAYS  Final   Report Status 01/12/2017 FINAL  Final  Culture, sputum-assessment     Status: None   Collection Time: 01/08/17  6:15 AM  Result Value Ref Range Status   Specimen Description SPUTUM  Final   Special Requests NONE  Final   Sputum evaluation   Final    Sputum specimen not acceptable for testing.  Please recollect.   Gram Stain Report Called to,Read Back By and Verified With: A. MABE RN, AT [phone] 01/08/17 BY Renato Shin    Report Status 01/08/2017 FINAL  Final  Respiratory Panel by PCR     Status: None   Collection Time: 01/08/17  9:53 AM  Result Value Ref Range Status   Adenovirus NOT  DETECTED NOT DETECTED Final   Coronavirus 229E NOT DETECTED NOT DETECTED Final   Coronavirus HKU1 NOT DETECTED NOT DETECTED Final   Coronavirus NL63 NOT DETECTED NOT DETECTED Final   Coronavirus OC43 NOT DETECTED NOT DETECTED Final   Metapneumovirus NOT DETECTED NOT DETECTED Final   Rhinovirus /  Enterovirus NOT DETECTED NOT DETECTED Final   Influenza A NOT DETECTED NOT DETECTED Final   Influenza B NOT DETECTED NOT DETECTED Final   Parainfluenza Virus 1 NOT DETECTED NOT DETECTED Final   Parainfluenza Virus 2 NOT DETECTED NOT DETECTED Final   Parainfluenza Virus 3 NOT DETECTED NOT DETECTED Final   Parainfluenza Virus 4 NOT DETECTED NOT DETECTED Final   Respiratory Syncytial Virus NOT DETECTED NOT DETECTED Final   Bordetella pertussis NOT DETECTED NOT DETECTED Final   Chlamydophila pneumoniae NOT DETECTED NOT DETECTED Final   Mycoplasma pneumoniae NOT DETECTED NOT DETECTED Final  MRSA PCR Screening     Status: None   Collection Time: 01/11/17 11:41 AM  Result Value Ref Range Status   MRSA by PCR NEGATIVE NEGATIVE Final    Comment:        The GeneXpert MRSA Assay (FDA approved for NASAL specimens only), is one component of a comprehensive MRSA colonization surveillance program. It is not intended to diagnose MRSA infection nor to guide or monitor treatment for MRSA infections.   Surgical pcr screen     Status: Abnormal   Collection Time: 01/12/17  4:42 PM  Result Value Ref Range Status   MRSA, PCR NEGATIVE NEGATIVE Final   Staphylococcus aureus POSITIVE (A) NEGATIVE Final    Comment:        The Xpert SA Assay (FDA approved for NASAL specimens in patients over 27 years of age), is one component of a comprehensive surveillance program.  Test performance has been validated by [facility] for patients greater than or equal to 5 year old. It is not intended to diagnose infection nor to guide or monitor treatment.   Aerobic/Anaerobic Culture (surgical/deep wound)     Status: None (Preliminary result)   Collection Time: 01/13/17  4:04 PM  Result Value Ref Range Status   Specimen Description LUNG LEFT TISSUE  Final   Special Requests SWAB SPEC A  Final   Gram Stain   Final    MODERATE WBC PRESENT,BOTH PMN AND MONONUCLEAR NO ORGANISMS SEEN    Culture NO GROWTH < 24  HOURS  Final   Report Status PENDING  Incomplete  Acid Fast Smear (AFB)     Status: None   Collection Time: 01/13/17  4:08 PM  Result Value Ref Range Status   AFB Specimen Processing Concentration  Final   Acid Fast Smear Negative  Final    Comment: (NOTE) Performed At: [facility] [address] [place], [place] [id number] Mila Homer MD WU:[id number]    Source (AFB) PLEURAL  Final    Comment: LEFT SPEC B   Aerobic/Anaerobic Culture (surgical/deep wound)     Status: None (Preliminary result)   Collection Time: 01/13/17  4:08 PM  Result Value Ref Range Status   Specimen Description PLEURAL LEFT FLUID  Final   Special Requests SPEC B  Final   Gram Stain   Final    RARE WBC PRESENT, PREDOMINANTLY PMN NO ORGANISMS SEEN    Culture NO GROWTH < 24 HOURS  Final   Report Status PENDING  Incomplete       Inpatient Medications:   Scheduled Meds: . acetaminophen  1,000 mg Oral  Q6H   Or  . acetaminophen (TYLENOL) oral liquid 160 mg/5 mL  1,000 mg Oral Q6H  . aspirin EC  325 mg Oral Daily  . bisacodyl  10 mg Oral Daily  . enoxaparin (LOVENOX) injection  40 mg Subcutaneous Daily  . fentaNYL   Intravenous Q4H  . insulin aspart  0-24 Units Subcutaneous Q4H  . mouth rinse  15 mL Mouth Rinse BID  . metoCLOPramide (REGLAN) injection  10 mg Intravenous Q6H  . mupirocin ointment  1 application Nasal BID  . pantoprazole (PROTONIX) IV  40 mg Intravenous Q24H  . senna-docusate  1 tablet Oral QHS   Continuous Infusions: . dextrose 5 % and 0.45% NaCl 30 mL/hr at 01/14/17 2300  . piperacillin-tazobactam (ZOSYN)  IV 3.375 g (01/15/17 0701)  . potassium chloride    . vancomycin Stopped (01/15/17 0334)     Radiological Exams on Admission: Dg Chest Port 1 View  Result Date: 01/14/2017 CLINICAL DATA:  Chest tubes in place.  Recent empyema EXAM: PORTABLE CHEST 1 VIEW COMPARISON:  Chest CT January 11, 2017 and chest radiograph January 13, 2017 FINDINGS: Chest tubes are present on the  left, unchanged in position. There is a minimal left apical pneumothorax. There has been interval drainage of loculated fluid collection on the left. There is patchy atelectasis in the left mid and lower lung zones. Right lung is clear. Heart is mildly enlarged with pulmonary vascularity within normal limits. No adenopathy. No evident bone lesions. IMPRESSION: Unchanged chest tube positions. Minimal left apical pneumothorax without tension component. Patchy atelectasis on the left. Lungs elsewhere clear. Stable cardiac prominence. Electronically Signed   By: Bretta BangWilliam  Woodruff III M.D.   On: 01/14/2017 07:22   Dg Chest Port 1 View  Result Date: 01/14/2017 CLINICAL DATA:  Postoperative surgery for lung empyema. EXAM: PORTABLE CHEST 1 VIEW COMPARISON:  CT chest 01/11/2017.  Chest 01/11/2017. FINDINGS: Interval placement of 3 left chest tubes. The left lung opacities seen previously have mostly resolved consistent with interval evacuation of pleural collection. There is residual linear atelectasis in the left mid and lower lung. Minimal if any visualized pneumothorax. Small amount of subcutaneous emphysema along the left lateral chest wall. Right lung is clear. Heart size and pulmonary vascularity are normal. IMPRESSION: Interval placement of 3 left chest tubes with evacuation of loculated fluid collection. Residual atelectasis on the left. Mild subcutaneous emphysema. Electronically Signed   By: Burman NievesWilliam  Stevens M.D.   On: 01/14/2017 03:37    Impression/Recommendations Principal Problem:   CAP (community acquired pneumonia) Active Problems:   Essential hypertension   Sepsis (HCC)   Multifocal pneumonia   Acute respiratory failure with hypoxia (HCC)   SOB (shortness of breath)   Pleural effusion   Empyema (HCC)   Hypoxemia   Empyema lung (HCC)  Sepsis Pneumonia LLL/ Left Empyema  -7/29 S/P VATS care per TCTS  Prolonged QT -Avoid QT prolongation medication -Repeat EKG on 8/1    Chest  pain -Mild elevation troponin most likely demand ischemia -Echocardiogram normal systolic function, no wall motion abnormality  Essential Hypertension -Currently controlled would not add any agents  Mild Transaminitis  -Monitor   Hypokalemia  -Potassium goal> 4 -Potassium IV 50 mEq -Repeat K/Mg at 1500    Thank you for this consultation.  [facility] team will follow the patient with you.   Time Spent: 30 minutes  Batsheva Stevick, Roselind MessierURTIS J M.D. [facility] 01/15/2017, 7:14 AM

## 2017-01-15 NOTE — Progress Notes (Signed)
Pharmacy Antibiotic Note  Caleb Avila is a 56 y.o. male admitted on 01/07/2017 with empyema, now s/p thoracentesis and VATS.  Pharmacy has been consulted to broaden antibiotics to vancomycin and Zosyn post VATS.   Patient's renal function is fluctuating.  He is now afebrile and his WBC is improving.  Reported vancomycin trough is elevated at 22 mcg/mL; however, previous dose was given late and trough was drawn early.  True trough should be ~18 mcg/mL after correcting for timing (Ke = 0.08076, t1/2 = 8.6 hrs).   Plan: Continue vanc 1gm IV Q8H Continue Zosyn 3.375g IV Q8H, 4 hr infusion Monitor renal fxn, clinical progress, vanc trough PRN   Height: 5\' 8"  (172.7 cm) Weight: 182 lb 12.2 oz (82.9 kg) IBW/kg (Calculated) : 68.4  Temp (24hrs), Avg:98.1 F (36.7 C), Min:97.6 F (36.4 C), Max:98.6 F (37 C)   Recent Labs Lab 01/11/17 0358 01/12/17 0338 01/12/17 1621 01/13/17 0627 01/14/17 0306 01/15/17 0218 01/15/17 0803  WBC 15.7* 16.0* 15.5* 15.1* 16.0* 13.3*  --   CREATININE 1.06  --  1.18 1.01 0.89 0.98  --   VANCOTROUGH  --   --   --   --   --   --  22*    Estimated Creatinine Clearance: 91.5 mL/min (by C-G formula based on SCr of 0.98 mg/dL).    No Known Allergies  CTX 7/23 x1 Doxy 7/23 >> 7/24; 7/27>>7/29 Unasyn 7/24 >> 7/29 Zosyn 7/29 >> Vanc 7/29 >>  7/31 VT = 22 mcg/mL (5.5 hr level, true trough = 18) on 1g q8 >> no change  7/23 BCx: neg 7/24 resp panel: neg 7/24 sputum: not acceptable 7/24 strep pneumo: neg 7/27 MRSA PCR: neg 7/28 MRSA PCR: neg, staph aureus: pos 7/29 pleural fluid A: pending 7/29 pleural fluid B: pending 7/29 lung tissue AFB: pending 7/29 lung tissue fungus:  pending   Tzirel Leonor D. Laney Potashang, PharmD, BCPS Pager:  [phone] 01/15/2017, 9:54 AM

## 2017-01-16 ENCOUNTER — Inpatient Hospital Stay (HOSPITAL_COMMUNITY): Payer: BLUE CROSS/BLUE SHIELD

## 2017-01-16 MED ORDER — POTASSIUM CHLORIDE CRYS ER 20 MEQ PO TBCR
20.0000 meq | EXTENDED_RELEASE_TABLET | Freq: Once | ORAL | Status: AC
  Administered 2017-01-16: 20 meq via ORAL
  Filled 2017-01-16: qty 1

## 2017-01-16 NOTE — Progress Notes (Signed)
[facility] TEAM 1 - Stepdown/ICU TEAM  Charlott Rakesrenton L Orlick  ZOX:[id number] DOB: 07/17/1960 DOA: 01/07/2017 PCP: Glori LuisSonnenberg, [name], MD    Brief Narrative:  56 year old male nonsmoker presented for evaluation of sudden onset of left pleuritic chest pain. CT ruled out PE but showed loculated left effusion and probable left lower lobe pneumonia. He was admitted and placed on IV antibiotics. His symptoms did not improve, w/ ongoing fever poor appetite and fatigue. White count rose up to 17,000. A repeat CT showed a large loculated left pleural effusion with complete compression atelectasis of left lower lobe. Thoracentesis was nonproductive. TCTS ultimately took the pt for a VATS on 7/29.  Subjective: No acute medical issues for [facility] to address today.  Will continue to follow w/ you.    Assessment & Plan:  Sepsis due to Left lower lobe pneumonia / left empyema S/p VATS 7/29 - ongoing care per TCTS - chest tubes being removed in stepwise fashion   Prolonged QT - LBBB Choosing meds w/ care - persists on EKG today   Chest pain in setting of pneumonia mild elevation in troponin likely due to demand ischemia - TTE with normal systolic function and no wall motion abnormalities  Essential hypertension BP reasonably controlled   Mild transaminitis  Improved on f/u   DVT prophylaxis: lovenox  Code Status: FULL CODE Family Communication:  Disposition Plan:   Consultants:  TCTS PCCM  Procedures: TTE 7/29 VATS  Antimicrobials:  Rocephin 7/23 Unasyn 7/24 > 7/29 Doxycycline 7/27 > 7/29 Zosyn 7/29 > Vanc 7/29 >  Objective: Blood pressure 119/73, pulse (!) 107, temperature 98 F (36.7 C), temperature source Oral, resp. rate 20, height 5\' 8"  (1.727 m), weight 79.3 kg (174 lb 13.2 oz), SpO2 97 %.  Intake/Output Summary (Last 24 hours) at 01/16/17 1457 Last data filed at 01/16/17 1300  Gross per 24 hour  Intake             1710 ml  Output             3100 ml  Net            -1390 ml     Filed Weights   01/12/17 2045 01/13/17 1949 01/16/17 0542  Weight: 81.3 kg (179 lb 3.2 oz) 82.9 kg (182 lb 12.2 oz) 79.3 kg (174 lb 13.2 oz)    Examination: No exam today   CBC:  Recent Labs Lab 01/12/17 0338 01/12/17 1621 01/13/17 0627 01/13/17 1644 01/14/17 0306 01/15/17 0218  WBC 16.0* 15.5* 15.1*  --  16.0* 13.3*  HGB 12.4* 13.2 12.0* 10.9* 11.2* 11.3*  HCT 37.8* 39.5 36.7* 32.0* 34.6* 34.9*  MCV 87.5 86.4 86.2  --  88.3 87.0  PLT 265 355 332  --  349 397   Basic Metabolic Panel:  Recent Labs Lab 01/11/17 0358 01/12/17 1621 01/13/17 0627 01/13/17 1644 01/14/17 0306 01/15/17 0218 01/15/17 1603  NA 140 139 138 139 138 138  --   K 4.2 3.9 3.6 3.6 4.1 3.4* 3.8  CL 106 105 106  --  105 104  --   CO2 21* 25 24  --  25 26  --   GLUCOSE 100* 103* 101*  --  197* 116*  --   BUN 12 12 12   --  10 9  --   CREATININE 1.06 1.18 1.01  --  0.89 0.98  --   CALCIUM 8.4* 8.5* 8.3*  --  8.3* 8.3*  --   MG  --   --   --   --   --   --  2.2   GFR: Estimated Creatinine Clearance: 84.3 mL/min (by C-G formula based on SCr of 0.98 mg/dL).  Liver Function Tests:  Recent Labs Lab 01/12/17 1621 01/15/17 0218  AST 86* 38  ALT 87* 66*  ALKPHOS 127* 89  BILITOT 0.7 0.7  PROT 7.0 6.2*  ALBUMIN 2.6* 2.4*    Coagulation Profile:  Recent Labs Lab 01/12/17 1621  INR 1.17    Cardiac Enzymes: No results for input(s): CKTOTAL, CKMB, CKMBINDEX, TROPONINI in the last 168 hours.  HbA1C: Hgb A1c MFr Bld  Date/Time Value Ref Range Status  11/25/2015 09:05 AM 5.5 4.6 - 6.5 % Final    Comment:    Glycemic Control Guidelines for People with Diabetes:Non Diabetic:  <6%Goal of Therapy: <7%Additional Action Suggested:  >8%     CBG:  Recent Labs Lab 01/14/17 2003 01/14/17 2335 01/15/17 0352 01/15/17 0820 01/15/17 1213  GLUCAP 108* 107* 120* 103* 101*    Recent Results (from the past 240 hour(s))  Culture, blood (x 2)     Status: None   Collection Time: 01/07/17  8:40  PM  Result Value Ref Range Status   Specimen Description BLOOD RIGHT ANTECUBITAL  Final   Special Requests   Final    BOTTLES DRAWN AEROBIC AND ANAEROBIC Blood Culture adequate volume   Culture NO GROWTH 5 DAYS  Final   Report Status 01/12/2017 FINAL  Final  Culture, blood (x 2)     Status: None   Collection Time: 01/07/17  8:45 PM  Result Value Ref Range Status   Specimen Description BLOOD RIGHT HAND  Final   Special Requests   Final    BOTTLES DRAWN AEROBIC AND ANAEROBIC Blood Culture adequate volume   Culture NO GROWTH 5 DAYS  Final   Report Status 01/12/2017 FINAL  Final  Culture, sputum-assessment     Status: None   Collection Time: 01/08/17  6:15 AM  Result Value Ref Range Status   Specimen Description SPUTUM  Final   Special Requests NONE  Final   Sputum evaluation   Final    Sputum specimen not acceptable for testing.  Please recollect.   Gram Stain Report Called to,Read Back By and Verified With: A. MABE RN, AT [phone] 01/08/17 BY D. Leighton RoachVANHOOK    Report Status 01/08/2017 FINAL  Final  Respiratory Panel by PCR     Status: None   Collection Time: 01/08/17  9:53 AM  Result Value Ref Range Status   Adenovirus NOT DETECTED NOT DETECTED Final   Coronavirus 229E NOT DETECTED NOT DETECTED Final   Coronavirus HKU1 NOT DETECTED NOT DETECTED Final   Coronavirus NL63 NOT DETECTED NOT DETECTED Final   Coronavirus OC43 NOT DETECTED NOT DETECTED Final   Metapneumovirus NOT DETECTED NOT DETECTED Final   Rhinovirus / Enterovirus NOT DETECTED NOT DETECTED Final   Influenza A NOT DETECTED NOT DETECTED Final   Influenza B NOT DETECTED NOT DETECTED Final   Parainfluenza Virus 1 NOT DETECTED NOT DETECTED Final   Parainfluenza Virus 2 NOT DETECTED NOT DETECTED Final   Parainfluenza Virus 3 NOT DETECTED NOT DETECTED Final   Parainfluenza Virus 4 NOT DETECTED NOT DETECTED Final   Respiratory Syncytial Virus NOT DETECTED NOT DETECTED Final   Bordetella pertussis NOT DETECTED NOT DETECTED Final    Chlamydophila pneumoniae NOT DETECTED NOT DETECTED Final   Mycoplasma pneumoniae NOT DETECTED NOT DETECTED Final  MRSA PCR Screening     Status: None   Collection Time: 01/11/17 11:41 AM  Result Value Ref Range  Status   MRSA by PCR NEGATIVE NEGATIVE Final    Comment:        The GeneXpert MRSA Assay (FDA approved for NASAL specimens only), is one component of a comprehensive MRSA colonization surveillance program. It is not intended to diagnose MRSA infection nor to guide or monitor treatment for MRSA infections.   Surgical pcr screen     Status: Abnormal   Collection Time: 01/12/17  4:42 PM  Result Value Ref Range Status   MRSA, PCR NEGATIVE NEGATIVE Final   Staphylococcus aureus POSITIVE (A) NEGATIVE Final    Comment:        The Xpert SA Assay (FDA approved for NASAL specimens in patients over 82 years of age), is one component of a comprehensive surveillance program.  Test performance has been validated by [facility] for patients greater than or equal to 13 year old. It is not intended to diagnose infection nor to guide or monitor treatment.   Aerobic/Anaerobic Culture (surgical/deep wound)     Status: None (Preliminary result)   Collection Time: 01/13/17  4:04 PM  Result Value Ref Range Status   Specimen Description LUNG LEFT TISSUE  Final   Special Requests SWAB SPEC A  Final   Gram Stain   Final    MODERATE WBC PRESENT,BOTH PMN AND MONONUCLEAR NO ORGANISMS SEEN    Culture   Final    NO GROWTH 3 DAYS NO ANAEROBES ISOLATED; CULTURE IN PROGRESS FOR 5 DAYS   Report Status PENDING  Incomplete  Fungus Culture With Stain     Status: None (Preliminary result)   Collection Time: 01/13/17  4:08 PM  Result Value Ref Range Status   Fungus Stain Final report  Final    Comment: (NOTE) Performed At: [facility] [address] [place], [place] [id number] Mila Homer MD WU:[id number]    Fungus (Mycology) Culture PENDING  Incomplete   Fungal Source PLEURAL   Final    Comment: LEFT SPEC B   Acid Fast Smear (AFB)     Status: None   Collection Time: 01/13/17  4:08 PM  Result Value Ref Range Status   AFB Specimen Processing Concentration  Final   Acid Fast Smear Negative  Final    Comment: (NOTE) Performed At: [facility] [address] [place], [place] [id number] Mila Homer MD VH:[id number]    Source (AFB) PLEURAL  Final    Comment: LEFT SPEC B   Aerobic/Anaerobic Culture (surgical/deep wound)     Status: None (Preliminary result)   Collection Time: 01/13/17  4:08 PM  Result Value Ref Range Status   Specimen Description PLEURAL LEFT FLUID  Final   Special Requests SPEC B  Final   Gram Stain   Final    RARE WBC PRESENT, PREDOMINANTLY PMN NO ORGANISMS SEEN    Culture   Final    NO GROWTH 3 DAYS NO ANAEROBES ISOLATED; CULTURE IN PROGRESS FOR 5 DAYS   Report Status PENDING  Incomplete  Fungus Culture Result     Status: None   Collection Time: 01/13/17  4:08 PM  Result Value Ref Range Status   Result 1 Comment  Final    Comment: (NOTE) KOH/Calcofluor preparation:  no fungus observed. Performed At: [facility] [address] [place], [place] [id number] Mila Homer MD UV:[id number]      Scheduled Meds: . acetaminophen  1,000 mg Oral Q6H   Or  . acetaminophen (TYLENOL) oral liquid 160 mg/5 mL  1,000 mg Oral Q6H  .  aspirin EC  325 mg Oral Daily  . bisacodyl  10 mg Oral Daily  . enoxaparin (LOVENOX) injection  40 mg Subcutaneous Daily  . feeding supplement (ENSURE ENLIVE)  237 mL Oral TID BM  . fentaNYL   Intravenous Q4H  . mouth rinse  15 mL Mouth Rinse BID  . metoCLOPramide (REGLAN) injection  10 mg Intravenous Q6H  . mupirocin ointment  1 application Nasal BID  . pantoprazole (PROTONIX) IV  40 mg Intravenous Q24H  . senna-docusate  1 tablet Oral QHS     LOS: 9 days   Lonia Blood, MD [facility] Office  [phone] Pager - Text Page per Amion as per below:  On-Call/Text  Page:      [email or web address]      password [email or web address]  If 7PM-7AM, please contact night-coverage [email or web address] Password [email or web address] 01/16/2017, 2:57 PM

## 2017-01-16 NOTE — Progress Notes (Addendum)
                    [address]            [place] [id number]          [phone]   3 Days Post-Op Procedure(s) (LRB): VIDEO ASSISTED THORACOSCOPY (VATS) DRAIN EMPYEMA (Left)  Total Length of Stay:  LOS: 9 days   Subjective: Patient with incisional pain.  Objective: Vital signs in last 24 hours: Temp:  [98.1 F (36.7 C)-99 F (37.2 C)] 98.2 F (36.8 C) (08/01 0542) Pulse Rate:  [80-113] 81 (08/01 0126) Cardiac Rhythm: Normal sinus rhythm;Bundle branch block;Other (Comment) (08/01 0700) Resp:  [13-31] 13 (08/01 0354) BP: (106-155)/(75-99) 106/82 (08/01 0542) SpO2:  [91 %-99 %] 99 % (08/01 0354) Weight:  [79.3 kg (174 lb 13.2 oz)] 79.3 kg (174 lb 13.2 oz) (08/01 0542)  Filed Weights   01/12/17 2045 01/13/17 1949 01/16/17 0542  Weight: 81.3 kg (179 lb 3.2 oz) 82.9 kg (182 lb 12.2 oz) 79.3 kg (174 lb 13.2 oz)       Intake/Output from previous day: 07/31 0701 - 08/01 0700 In: 2630 [P.O.:1040; I.V.:890; IV Piggyback:700] Out: 3360 [Urine:3240; Chest Tube:120]  Physical Exam: Heart: RRR Lungs: Slightly diminished at left base and clear on the right Extremities: No LE edema Wound: Clean and dry  Lab Results: CBC:  Recent Labs  01/14/17 0306 01/15/17 0218  WBC 16.0* 13.3*  HGB 11.2* 11.3*  HCT 34.6* 34.9*  PLT 349 397   BMET:   Recent Labs  01/14/17 0306 01/15/17 0218 01/15/17 1603  NA 138 138  --   K 4.1 3.4* 3.8  CL 105 104  --   CO2 25 26  --   GLUCOSE 197* 116*  --   BUN 10 9  --   CREATININE 0.89 0.98  --   CALCIUM 8.3* 8.3*  --     CMET: Lab Results  Component Value Date   WBC 13.3 (H) 01/15/2017   HGB 11.3 (L) 01/15/2017   HCT 34.9 (L) 01/15/2017   PLT 397 01/15/2017   GLUCOSE 116 (H) 01/15/2017   CHOL 153 11/25/2015   TRIG 106.0 11/25/2015   HDL 35.50 (L) 11/25/2015   LDLCALC 96 11/25/2015   ALT 66 (H) 01/15/2017   AST 38 01/15/2017   NA 138 01/15/2017   K 3.8 01/15/2017   CL 104 01/15/2017   CREATININE 0.98  01/15/2017   BUN 9 01/15/2017   CO2 26 01/15/2017   TSH 1.01 11/25/2015   PSA 3.08 11/25/2015   INR 1.17 01/12/2017   HGBA1C 5.5 11/25/2015    Assessment/Plan: S/P Procedure(s) (LRB): VIDEO ASSISTED THORACOSCOPY (VATS) DRAIN EMPYEMA (Left)  1. CV-SR in the 80-90's. 2. Pulmonary-1 chest tube removed yesterday. 2 chest tubes to suction. 120 cc of output last 24 hours.  CXR this am shows ? Trace left apical pneumothorax, small left pleural effusion and atelectasis. As discussed with Dr. Donata ClayVan Trigt, will remove 28 Bard chest tube today and place remaining on water seal. Encourage incentive spirometer. Will remove PCA once all chest tubes have been removed. 3. Continue with PCA, Oxy, Ultram for pain control. 4. ID-on Vancomycin and Zosyn for PNA. Cultures show no growth to date. Will likely transition to Augmentin at discharge. 5. Supplement potassium   ZIMMERMAN,DONIELLE M PA-C 01/16/2017 7:26 AM    patient examined and medical record reviewed,agree with above note. Kathlee Nationseter Van Trigt III 01/16/2017

## 2017-01-16 NOTE — Progress Notes (Signed)
Patient received from 2 [facility] via wheelchair. Alert and orin. Orin. To room R.N into help with patient.

## 2017-01-17 ENCOUNTER — Inpatient Hospital Stay (HOSPITAL_COMMUNITY): Payer: BLUE CROSS/BLUE SHIELD

## 2017-01-17 LAB — BASIC METABOLIC PANEL
Anion gap: 10 (ref 5–15)
BUN: 12 mg/dL (ref 6–20)
CO2: 27 mmol/L (ref 22–32)
CREATININE: 1.05 mg/dL (ref 0.61–1.24)
Calcium: 9.2 mg/dL (ref 8.9–10.3)
Chloride: 103 mmol/L (ref 101–111)
Glucose, Bld: 104 mg/dL — ABNORMAL HIGH (ref 65–99)
POTASSIUM: 4.1 mmol/L (ref 3.5–5.1)
SODIUM: 140 mmol/L (ref 135–145)

## 2017-01-17 LAB — CBC WITH DIFFERENTIAL/PLATELET
BASOS ABS: 0 10*3/uL (ref 0.0–0.1)
BASOS PCT: 0 %
EOS PCT: 3 %
Eosinophils Absolute: 0.3 10*3/uL (ref 0.0–0.7)
HCT: 40.3 % (ref 39.0–52.0)
Hemoglobin: 12.7 g/dL — ABNORMAL LOW (ref 13.0–17.0)
Lymphocytes Relative: 18 %
Lymphs Abs: 2.4 10*3/uL (ref 0.7–4.0)
MCH: 27.7 pg (ref 26.0–34.0)
MCHC: 31.5 g/dL (ref 30.0–36.0)
MCV: 88 fL (ref 78.0–100.0)
MONO ABS: 0.9 10*3/uL (ref 0.1–1.0)
Monocytes Relative: 6 %
Neutro Abs: 9.8 10*3/uL — ABNORMAL HIGH (ref 1.7–7.7)
Neutrophils Relative %: 73 %
PLATELETS: 625 10*3/uL — AB (ref 150–400)
RBC: 4.58 MIL/uL (ref 4.22–5.81)
RDW: 13.2 % (ref 11.5–15.5)
WBC: 13.4 10*3/uL — ABNORMAL HIGH (ref 4.0–10.5)

## 2017-01-17 LAB — LACTIC ACID, PLASMA: LACTIC ACID, VENOUS: 1.6 mmol/L (ref 0.5–1.9)

## 2017-01-17 LAB — MAGNESIUM: MAGNESIUM: 2.5 mg/dL — AB (ref 1.7–2.4)

## 2017-01-17 MED ORDER — PANTOPRAZOLE SODIUM 40 MG PO TBEC
40.0000 mg | DELAYED_RELEASE_TABLET | Freq: Every day | ORAL | Status: DC
  Administered 2017-01-17 – 2017-01-20 (×4): 40 mg via ORAL
  Filled 2017-01-17 (×4): qty 1

## 2017-01-17 MED ORDER — LACTULOSE 10 GM/15ML PO SOLN
20.0000 g | Freq: Once | ORAL | Status: AC
  Administered 2017-01-17: 20 g via ORAL
  Filled 2017-01-17: qty 30

## 2017-01-17 NOTE — Discharge Instructions (Signed)
Thoracotomy, Care After ° °This sheet gives you information about how to care for yourself after your procedure. Your health care provider may also give you more specific instructions. If you have problems or questions, contact your health care provider. °What can I expect after the procedure? °After your procedure, it is common to have: °· Pain and swelling around the incision area. °· Pain when you breathe in (inhale). °· Constipation. °· Fatigue. °· Loss of appetite. °· Trouble sleeping. °· Mood swings and depression. ° °Follow these instructions at home: °Preventing pneumonia °· Take deep breaths or do breathing exercises as instructed by your health care provider. °· Cough frequently. Coughing may cause discomfort, but it is important to clear mucus (phlegm) and expand your lungs. If coughing hurts, hold a pillow against your chest or place both hands flat on top of the incision (splinting) when you cough. This may help relieve discomfort. °· Continue to use an incentive spirometer as directed. This is a tool that measures how well you fill your lungs with each breath. °· Participate in pulmonary rehabilitation as directed. This is a program that combines education, exercise, and support from a team of specialists. The goal is to help you heal and return to normal activities as soon as possible. °Medicines °· Take over-the-counter or prescription medicines only as told by your health care provider. °· If you have pain, take pain-relieving medicine before your pain becomes severe. This is important because if your pain is under control, you will be able to breathe and cough more comfortably. °· If you were prescribed an antibiotic medicine, take it as told by your health care provider. Do not stop taking the antibiotic even if you start to feel better. °Activity °· Ask your health care provider what activities are safe for you. °· Do not travel by airplane for 2 weeks after your chest tube is removed, or until  your health care provider says that this is safe. °· Do not lift anything that is heavier than 10 lb (4.5 kg), or the limit that your health care provider tells you, until he or she says that it is safe. °· Do not drive until your health care provider approves. °? Do not drive or use heavy machinery while taking prescription pain medicine. °Incision care °· Follow instructions from your health care provider about how to take care of your incision. Make sure you: °? Wash your hands with soap and water before you change your bandage (dressing). If soap and water are not available, use hand sanitizer. °? Change your dressing as told by your health care provider. °? Leave stitches (sutures), skin glue, or adhesive strips in place. These skin closures may need to stay in place for 2 weeks or longer. If adhesive strip edges start to loosen and curl up, you may trim the loose edges. Do not remove adhesive strips completely unless your health care provider tells you to do that. °· Keep your dressing dry. °· Check your incision area every day for signs of infection. Check for: °? More redness, swelling, or pain. °? More fluid or blood. °? Warmth. °? Pus or a bad smell. °Bathing °· Do not take baths, swim, or use a hot tub until your health care provider approves. You may take showers. °· After your dressing has been removed, use soap and water to gently wash your incision area. Do not use anything else to clean your incision unless your health care provider tells you to do that. °Eating   and drinking °· Eat a healthy diet as instructed by your health care provider. A healthy diet includes plenty of fresh fruits and vegetables, whole grains, and low-fat (lean) proteins. °· Drink enough fluid to keep your urine clear or pale yellow. °General instructions °· To prevent or treat constipation while you are taking prescription pain medicine, your health care provider may recommend that you: °? Take over-the-counter or prescription  medicines. °? Eat foods that are high in fiber, such as fresh fruits and vegetables, whole grains, and beans. °? Limit foods that are high in fat and processed sugars, such as fried and sweet foods. °· Do not use any products that contain nicotine or tobacco, such as cigarettes and e-cigarettes. If you need help quitting, ask your health care provider. °· Avoid secondhand smoke. °· Wear compression stockings as told by your health care provider. These stockings help to prevent blood clots and reduce swelling in your legs. °· If you have a chest tube, care for it as instructed. °· Keep all follow-up visits as told by your health care provider. This is important. °Contact a health care provider if: °· You have more redness, swelling, or pain around your incision. °· You have more fluid or blood coming from your incision. °· Your incision feels warm to the touch. °· You have pus or a bad smell coming from your incision. °· You have a fever or chills. °· Your heartbeat seems irregular. °· You have nausea or vomiting. °· You have muscle aches. °· You are constipated. This may mean that you have: °? Fewer bowel movements in a week than normal. °? Difficulty having a bowel movement. °? Stools that are dry, hard, or larger than normal. °Get help right away if: °· You develop a rash. °· You feel light-headed or feel like you are going to faint. °· You have shortness of breath or trouble breathing. °· You are confused. °· You have trouble speaking. °· You have vision problems. °· You are not able to move. °· You have numbness in your face, arms, or legs. °· You lose consciousness. °· You have a sudden, severe headache. °· You feel weak. °· You have chest pain. °· You have pain that: °? Is severe. °? Gets worse, even with medicine. °Summary °· To prevent pneumonia, take deep breaths, do breathing exercises, and cough frequently, as instructed by your health care provider. °· Do not drive until your health care provider  approves. Do not travel by airplane for 2 weeks after your chest tube is removed, or until your health care provider approves. °· Check your incision area every day for signs of infection. °· Eat a healthy diet that includes plenty of fresh fruits and vegetables, whole grains, and low-fat (lean) proteins. °This information is not intended to replace advice given to you by your health care provider. Make sure you discuss any questions you have with your health care provider. °Document Released: 11/17/2010 Document Revised: 02/27/2016 Document Reviewed: 02/27/2016 °Elsevier Interactive Patient Education © 2017 [employer] ° °

## 2017-01-17 NOTE — Progress Notes (Signed)
Chest tube dc'd per order.

## 2017-01-17 NOTE — Progress Notes (Signed)
PROGRESS NOTE    Caleb Avila  ZOX:[id number] DOB: 11/24/1960 DOA: 01/07/2017 PCP: Glori LuisSonnenberg, [name], MD   Brief Narrative:  56 y.o. WM PMHx Depression, HTN, Frequent HA, nonsmoker   Presented for evaluation of sudden onset of left pleuritic chest pain. CT ruled out PE but showed loculated left effusion and probable left lower lobe pneumonia. He was admitted and placed on IV antibiotics. His symptoms did not improve, w/ ongoing fever poor appetite and fatigue. White count rose up to 17,000. A repeat CT showed a large loculated left pleural effusion with complete compression atelectasis of left lower lobe. Thoracentesis was nonproductive. TCTS ultimately took the pt for a VATS on 7/29.   Subjective: 8/2  A/O 4, negative CP, negative SOB, negative N/V, negative abdominal pain. Positive CP when he coughs.   Assessment & Plan:   Principal Problem:   CAP (community acquired pneumonia) Active Problems:   Essential hypertension   Sepsis (HCC)   Multifocal pneumonia   Acute respiratory failure with hypoxia (HCC)   SOB (shortness of breath)   Pleural effusion   Empyema (HCC)   Hypoxemia   Empyema lung (HCC)   Sepsis Pneumonia LLL/ Left Empyema  -7/20 9S/P VATS: Last chest tube pulled on 8/2 -If patient remains afebrile overnight, DC vancomycin and Zosyn and transitioned to Augmentin  Prolonged QT -Avoid QT prolongation medication -Repeat EKG on 8/1: Continue slightly prolonged QT interval -Obtain EKG just prior to discharge   Chest pain -Resolving post removal last chest tube. -On 8/3 ambulatory SPO2 -PT/OT consult placed  Essential Hypertension -Controlled   Mild Transaminitis  -Monitor   Hypokalemia  -Potassium goal > 4     DVT prophylaxis: Lovenox  Code Status: Full Family Communication: Wife at bedside Disposition Plan: TBD   Consultants:  Cardiothoracic surgery   Procedures/Significant Events:  7/25 Echocardiogram:Left ventricle: The  cavity size was normal. There was mild focal basal hypertrophy of the septum. LVEF = 60% to 65%. - Ventricular septum: Septal motion showed severe paradox. C/W  intraventricular conduction delay. - Atrial septum: Increased thickness of the septum, c/w lipomatous hypertrophy. 7/29 Left VATS-mini thoracotomy for drainage of left empyema.- Decortication of the left lower lobe   VENTILATOR SETTINGS: None   Cultures 7/23 blood negative final 7/24 sputum unacceptable specimen 7/27 MRSA PCR negative 7/29 left lung tissue NGTD 7/29 left pleural fluid NGTD    Antimicrobials: Anti-infectives    Start     Stop   01/14/17 1000  vancomycin (VANCOCIN) IVPB 1000 mg/200 mL premix         01/14/17 0100  vancomycin (VANCOCIN) IVPB 1000 mg/200 mL premix  Status:  Discontinued     01/14/17 0855   01/13/17 2030  piperacillin-tazobactam (ZOSYN) IVPB 3.375 g         01/13/17 0600  vancomycin (VANCOCIN) IVPB 1000 mg/200 mL premix     01/13/17 1625   01/11/17 1230  doxycycline (VIBRAMYCIN) 100 mg in dextrose 5 % 250 mL IVPB  Status:  Discontinued     01/13/17 1357   01/08/17 1100  Ampicillin-Sulbactam (UNASYN) 3 g in sodium chloride 0.9 % 100 mL IVPB  Status:  Discontinued     01/13/17 1933   01/07/17 2100  doxycycline (VIBRAMYCIN) 100 mg in dextrose 5 % 250 mL IVPB  Status:  Discontinued     01/08/17 1009   01/07/17 2000  cefTRIAXone (ROCEPHIN) 1 g in dextrose 5 % 50 mL IVPB  Status:  Discontinued     01/08/17 1009  01/07/17 2000  azithromycin (ZITHROMAX) 500 mg in dextrose 5 % 250 mL IVPB  Status:  Discontinued     01/07/17 2015   01/07/17 1930  cefTRIAXone (ROCEPHIN) 1 g in dextrose 5 % 50 mL IVPB  Status:  Discontinued     01/07/17 1950   01/07/17 1930  azithromycin (ZITHROMAX) 500 mg in dextrose 5 % 250 mL IVPB  Status:  Discontinued     01/07/17 1951       Devices    LINES / TUBES:  Left chest tube 37/29>>> 8/2    Continuous Infusions: . dextrose 5 % and 0.45% NaCl    .  piperacillin-tazobactam (ZOSYN)  IV 3.375 g (01/17/17 1518)  . potassium chloride Stopped (01/15/17 2000)  . vancomycin 1,000 mg (01/17/17 1518)     Objective: Vitals:   01/17/17 0701 01/17/17 0746 01/17/17 1113 01/17/17 1554  BP:  125/77 130/83 128/89  Pulse:  92 93 (!) 115  Resp:  17  17  Temp:  97.7 F (36.5 C) 99.1 F (37.3 C) 99.3 F (37.4 C)  TempSrc:  Oral Oral Oral  SpO2:  97% 98% 96%  Weight: 170 lb 6.4 oz (77.3 kg)     Height:        Intake/Output Summary (Last 24 hours) at 01/17/17 1740 Last data filed at 01/17/17 1300  Gross per 24 hour  Intake             1220 ml  Output             1501 ml  Net             -281 ml   Filed Weights   01/13/17 1949 01/16/17 0542 01/17/17 0701  Weight: 182 lb 12.2 oz (82.9 kg) 174 lb 13.2 oz (79.3 kg) 170 lb 6.4 oz (77.3 kg)    Examination:  General: A/O 4, No acute respiratory distress Eyes: negative scleral hemorrhage, negative anisocoria, negative icterus Lungs: Clear to auscultation bilaterally (decreased breath sounds LLL), without wheezes or crackles Cardiovascular: Regular rate and rhythm without murmur gallop or rub normal S1 and S2 Abdomen: negative abdominal pain, nondistended, positive soft, bowel sounds, no rebound, no ascites, no appreciable mass Extremities: No significant cyanosis, clubbing, or edema bilateral lower extremities Skin: Negative rashes, lesions, ulcers Psychiatric:  Negative depression, negative anxiety, negative fatigue, negative mania  Central nervous system:  Cranial nerves II through XII intact, tongue/uvula midline, all extremities muscle strength 5/5, sensation intact throughout,  negative dysarthria, negative expressive aphasia, negative receptive aphasia.  .     Data Reviewed: Care during the described time interval was provided by me .  I have reviewed this patient's available data, including medical history, events of note, physical examination, and all test results as part of my  evaluation. I have personally reviewed and interpreted all radiology studies.  CBC:  Recent Labs Lab 01/12/17 1621 01/13/17 0627 01/13/17 1644 01/14/17 0306 01/15/17 0218 01/17/17 0948  WBC 15.5* 15.1*  --  16.0* 13.3* 13.4*  NEUTROABS  --   --   --   --   --  9.8*  HGB 13.2 12.0* 10.9* 11.2* 11.3* 12.7*  HCT 39.5 36.7* 32.0* 34.6* 34.9* 40.3  MCV 86.4 86.2  --  88.3 87.0 88.0  PLT 355 332  --  349 397 625*   Basic Metabolic Panel:  Recent Labs Lab 01/12/17 1621 01/13/17 0627 01/13/17 1644 01/14/17 0306 01/15/17 0218 01/15/17 1603 01/17/17 0948  NA 139 138 139 138 138  --  140  K 3.9 3.6 3.6 4.1 3.4* 3.8 4.1  CL 105 106  --  105 104  --  103  CO2 25 24  --  25 26  --  27  GLUCOSE 103* 101*  --  197* 116*  --  104*  BUN 12 12  --  10 9  --  12  CREATININE 1.18 1.01  --  0.89 0.98  --  1.05  CALCIUM 8.5* 8.3*  --  8.3* 8.3*  --  9.2  MG  --   --   --   --   --  2.2 2.5*   GFR: Estimated Creatinine Clearance: 78.7 mL/min (by C-G formula based on SCr of 1.05 mg/dL). Liver Function Tests:  Recent Labs Lab 01/12/17 1621 01/15/17 0218  AST 86* 38  ALT 87* 66*  ALKPHOS 127* 89  BILITOT 0.7 0.7  PROT 7.0 6.2*  ALBUMIN 2.6* 2.4*   No results for input(s): LIPASE, AMYLASE in the last 168 hours. No results for input(s): AMMONIA in the last 168 hours. Coagulation Profile:  Recent Labs Lab 01/12/17 1621  INR 1.17   Cardiac Enzymes: No results for input(s): CKTOTAL, CKMB, CKMBINDEX, TROPONINI in the last 168 hours. BNP (last 3 results) No results for input(s): PROBNP in the last 8760 hours. HbA1C: No results for input(s): HGBA1C in the last 72 hours. CBG:  Recent Labs Lab 01/14/17 2003 01/14/17 2335 01/15/17 0352 01/15/17 0820 01/15/17 1213  GLUCAP 108* 107* 120* 103* 101*   Lipid Profile: No results for input(s): CHOL, HDL, LDLCALC, TRIG, CHOLHDL, LDLDIRECT in the last 72 hours. Thyroid Function Tests: No results for input(s): TSH, T4TOTAL,  FREET4, T3FREE, THYROIDAB in the last 72 hours. Anemia Panel: No results for input(s): VITAMINB12, FOLATE, FERRITIN, TIBC, IRON, RETICCTPCT in the last 72 hours. Urine analysis:    Component Value Date/Time   COLORURINE YELLOW 01/12/2017 2053   APPEARANCEUR CLEAR 01/12/2017 2053   LABSPEC 1.018 01/12/2017 2053   PHURINE 6.0 01/12/2017 2053   GLUCOSEU NEGATIVE 01/12/2017 2053   HGBUR NEGATIVE 01/12/2017 2053   BILIRUBINUR NEGATIVE 01/12/2017 2053   KETONESUR NEGATIVE 01/12/2017 2053   PROTEINUR NEGATIVE 01/12/2017 2053   NITRITE NEGATIVE 01/12/2017 2053   LEUKOCYTESUR NEGATIVE 01/12/2017 2053   Sepsis Labs: @LABRCNTIP (procalcitonin:4,lacticidven:4)  ) Recent Results (from the past 240 hour(s))  Culture, blood (x 2)     Status: None   Collection Time: 01/07/17  8:40 PM  Result Value Ref Range Status   Specimen Description BLOOD RIGHT ANTECUBITAL  Final   Special Requests   Final    BOTTLES DRAWN AEROBIC AND ANAEROBIC Blood Culture adequate volume   Culture NO GROWTH 5 DAYS  Final   Report Status 01/12/2017 FINAL  Final  Culture, blood (x 2)     Status: None   Collection Time: 01/07/17  8:45 PM  Result Value Ref Range Status   Specimen Description BLOOD RIGHT HAND  Final   Special Requests   Final    BOTTLES DRAWN AEROBIC AND ANAEROBIC Blood Culture adequate volume   Culture NO GROWTH 5 DAYS  Final   Report Status 01/12/2017 FINAL  Final  Culture, sputum-assessment     Status: None   Collection Time: 01/08/17  6:15 AM  Result Value Ref Range Status   Specimen Description SPUTUM  Final   Special Requests NONE  Final   Sputum evaluation   Final    Sputum specimen not acceptable for testing.  Please recollect.   Gram Stain Report  Called to,Read Back By and Verified With: A. MABE RN, AT [phone] 01/08/17 BY D. Leighton RoachVANHOOK    Report Status 01/08/2017 FINAL  Final  Respiratory Panel by PCR     Status: None   Collection Time: 01/08/17  9:53 AM  Result Value Ref Range Status    Adenovirus NOT DETECTED NOT DETECTED Final   Coronavirus 229E NOT DETECTED NOT DETECTED Final   Coronavirus HKU1 NOT DETECTED NOT DETECTED Final   Coronavirus NL63 NOT DETECTED NOT DETECTED Final   Coronavirus OC43 NOT DETECTED NOT DETECTED Final   Metapneumovirus NOT DETECTED NOT DETECTED Final   Rhinovirus / Enterovirus NOT DETECTED NOT DETECTED Final   Influenza A NOT DETECTED NOT DETECTED Final   Influenza B NOT DETECTED NOT DETECTED Final   Parainfluenza Virus 1 NOT DETECTED NOT DETECTED Final   Parainfluenza Virus 2 NOT DETECTED NOT DETECTED Final   Parainfluenza Virus 3 NOT DETECTED NOT DETECTED Final   Parainfluenza Virus 4 NOT DETECTED NOT DETECTED Final   Respiratory Syncytial Virus NOT DETECTED NOT DETECTED Final   Bordetella pertussis NOT DETECTED NOT DETECTED Final   Chlamydophila pneumoniae NOT DETECTED NOT DETECTED Final   Mycoplasma pneumoniae NOT DETECTED NOT DETECTED Final  MRSA PCR Screening     Status: None   Collection Time: 01/11/17 11:41 AM  Result Value Ref Range Status   MRSA by PCR NEGATIVE NEGATIVE Final    Comment:        The GeneXpert MRSA Assay (FDA approved for NASAL specimens only), is one component of a comprehensive MRSA colonization surveillance program. It is not intended to diagnose MRSA infection nor to guide or monitor treatment for MRSA infections.   Surgical pcr screen     Status: Abnormal   Collection Time: 01/12/17  4:42 PM  Result Value Ref Range Status   MRSA, PCR NEGATIVE NEGATIVE Final   Staphylococcus aureus POSITIVE (A) NEGATIVE Final    Comment:        The Xpert SA Assay (FDA approved for NASAL specimens in patients over 54 years of age), is one component of a comprehensive surveillance program.  Test performance has been validated by [facility] for patients greater than or equal to 54 year old. It is not intended to diagnose infection nor to guide or monitor treatment.   Aerobic/Anaerobic Culture (surgical/deep  wound)     Status: None (Preliminary result)   Collection Time: 01/13/17  4:04 PM  Result Value Ref Range Status   Specimen Description LUNG LEFT TISSUE  Final   Special Requests SWAB SPEC A  Final   Gram Stain   Final    MODERATE WBC PRESENT,BOTH PMN AND MONONUCLEAR NO ORGANISMS SEEN    Culture   Final    NO GROWTH 4 DAYS NO ANAEROBES ISOLATED; CULTURE IN PROGRESS FOR 5 DAYS   Report Status PENDING  Incomplete  Fungus Culture With Stain     Status: None (Preliminary result)   Collection Time: 01/13/17  4:08 PM  Result Value Ref Range Status   Fungus Stain Final report  Final    Comment: (NOTE) Performed At: [facility] [address] [place], [place] [id number] Mila HomerHancock William F MD JX:[id number]:[phone]    Fungus (Mycology) Culture PENDING  Incomplete   Fungal Source PLEURAL  Final    Comment: LEFT SPEC B   Acid Fast Smear (AFB)     Status: None   Collection Time: 01/13/17  4:08 PM  Result Value Ref Range Status   AFB Specimen Processing  Concentration  Final   Acid Fast Smear Negative  Final    Comment: (NOTE) Performed At: [facility] [address] [place], [place] [id number] Mila Homer MD WU:[id number]    Source (AFB) PLEURAL  Final    Comment: LEFT SPEC B   Aerobic/Anaerobic Culture (surgical/deep wound)     Status: None (Preliminary result)   Collection Time: 01/13/17  4:08 PM  Result Value Ref Range Status   Specimen Description PLEURAL LEFT FLUID  Final   Special Requests SPEC B  Final   Gram Stain   Final    RARE WBC PRESENT, PREDOMINANTLY PMN NO ORGANISMS SEEN    Culture   Final    NO GROWTH 4 DAYS NO ANAEROBES ISOLATED; CULTURE IN PROGRESS FOR 5 DAYS   Report Status PENDING  Incomplete  Fungus Culture Result     Status: None   Collection Time: 01/13/17  4:08 PM  Result Value Ref Range Status   Result 1 Comment  Final    Comment: (NOTE) KOH/Calcofluor preparation:  no fungus observed. Performed At: [facility] [address] [place], [place] [id number] Mila Homer MD VH:[id number]          Radiology Studies: Dg Chest Port 1 View  Result Date: 01/17/2017 CLINICAL DATA:  Pneumothorax.  Chest tube . EXAM: PORTABLE CHEST 1 VIEW COMPARISON:  01/16/2017.  CT 01/07/2017. FINDINGS: Left chest tube in stable position. No pneumothorax . Left base atelectasis and infiltrate. Mild right base subsegmental atelectasis. Small left pleural effusion. Heart size stable. No acute bony abnormality. IMPRESSION: 1. Left chest tube in stable position.  No pneumothorax. 2. Left base subsegmental atelectasis and or infiltrate. Mild right base subsegmental atelectasis. Small left pleural effusion . No significant change from prior exam . Electronically Signed   By: Maisie Fus  Register   On: 01/17/2017 07:24   Dg Chest Port 1 View  Result Date: 01/16/2017 CLINICAL DATA:  Pneumothorax EXAM: PORTABLE CHEST 1 VIEW COMPARISON:  Yesterday FINDINGS: Left chest tubes are stable with its tip towards the left hemithorax apex. There is no evidence of pneumothorax. Scattered subsegmental atelectasis in the left mid and lower lung zones. Right lung is clear. Normal heart size. IMPRESSION: Stable left chest tube without pneumothorax. Electronically Signed   By: Jolaine Click M.D.   On: 01/16/2017 07:38        Scheduled Meds: . acetaminophen  1,000 mg Oral Q6H   Or  . acetaminophen (TYLENOL) oral liquid 160 mg/5 mL  1,000 mg Oral Q6H  . aspirin EC  325 mg Oral Daily  . bisacodyl  10 mg Oral Daily  . enoxaparin (LOVENOX) injection  40 mg Subcutaneous Daily  . feeding supplement (ENSURE ENLIVE)  237 mL Oral TID BM  . mouth rinse  15 mL Mouth Rinse BID  . metoCLOPramide (REGLAN) injection  10 mg Intravenous Q6H  . mupirocin ointment  1 application Nasal BID  . pantoprazole  40 mg Oral Daily  . senna-docusate  1 tablet Oral QHS   Continuous Infusions: . dextrose 5 % and 0.45% NaCl    . piperacillin-tazobactam (ZOSYN)  IV 3.375 g (01/17/17  1518)  . potassium chloride Stopped (01/15/17 2000)  . vancomycin 1,000 mg (01/17/17 1518)     LOS: 10 days    Time spent: 40 minutes    WOODS, Roselind Messier, MD [facility] Pager [phone]   If 7PM-7AM, please contact night-coverage [email or web address] Password [email or web address] 01/17/2017, 5:40 PM

## 2017-01-17 NOTE — Progress Notes (Addendum)
                    [address]            [place] [id number]          [phone]   4 Days Post-Op Procedure(s) (LRB): VIDEO ASSISTED THORACOSCOPY (VATS) DRAIN EMPYEMA (Left)  Total Length of Stay:  LOS: 10 days   Subjective: Patient with incisional pain and hopes last tube is removed today.  Objective: Vital signs in last 24 hours: Temp:  [97.7 F (36.5 C)-98.7 F (37.1 C)] 97.7 F (36.5 C) (08/02 0151) Pulse Rate:  [78-107] 78 (08/02 0151) Cardiac Rhythm: Normal sinus rhythm (08/01 1900) Resp:  [14-28] 14 (08/02 0346) BP: (115-130)/(73-86) 127/82 (08/02 0151) SpO2:  [95 %-98 %] 97 % (08/02 0346) FiO2 (%):  [2 %] 2 % (08/01 1700) Weight:  [77.3 kg (170 lb 6.4 oz)] 77.3 kg (170 lb 6.4 oz) (08/02 0701)  Filed Weights   01/13/17 1949 01/16/17 0542 01/17/17 0701  Weight: 82.9 kg (182 lb 12.2 oz) 79.3 kg (174 lb 13.2 oz) 77.3 kg (170 lb 6.4 oz)       Intake/Output from previous day: 08/01 0701 - 08/02 0700 In: 1340 [P.O.:840; IV Piggyback:500] Out: 1725 [Urine:1725]  Physical Exam: Heart: RRR Lungs: Slightly diminished at left base and clear on the right Extremities: No LE edema Wound: Clean and dry Chest tube: to water seal and no air leak  Lab Results: CBC:  Recent Labs  01/15/17 0218  WBC 13.3*  HGB 11.3*  HCT 34.9*  PLT 397   BMET:   Recent Labs  01/15/17 0218 01/15/17 1603  NA 138  --   K 3.4* 3.8  CL 104  --   CO2 26  --   GLUCOSE 116*  --   BUN 9  --   CREATININE 0.98  --   CALCIUM 8.3*  --     CMET: Lab Results  Component Value Date   WBC 13.3 (H) 01/15/2017   HGB 11.3 (L) 01/15/2017   HCT 34.9 (L) 01/15/2017   PLT 397 01/15/2017   GLUCOSE 116 (H) 01/15/2017   CHOL 153 11/25/2015   TRIG 106.0 11/25/2015   HDL 35.50 (L) 11/25/2015   LDLCALC 96 11/25/2015   ALT 66 (H) 01/15/2017   AST 38 01/15/2017   NA 138 01/15/2017   K 3.8 01/15/2017   CL 104 01/15/2017   CREATININE 0.98 01/15/2017   BUN 9 01/15/2017     CO2 26 01/15/2017   TSH 1.01 11/25/2015   PSA 3.08 11/25/2015   INR 1.17 01/12/2017   HGBA1C 5.5 11/25/2015    Assessment/Plan: S/P Procedure(s) (LRB): VIDEO ASSISTED THORACOSCOPY (VATS) DRAIN EMPYEMA (Left)  1. CV-SR in the 80-90's. 2. Pulmonary-1 chest tube to water seal and there is no air leak. Chest tube has had scant output this am. CXR this am appears stable- no pneumothorax, small left pleural effusion and atelectasis. As discussed with Dr. Donata ClayVan Trigt, will remove remaining chest tube. Encourage incentive spirometer. Will remove PCA once all chest tubes have been removed. 3. Continue with PCA, Oxy, Ultram for pain control. 4. ID-on Vancomycin and Zosyn for PNA. Cultures show no growth to date. Will likely transition to Augmentin at discharge. 5. LOC constipation   Clarann Helvey M PA-C 01/17/2017 7:23 AM

## 2017-01-17 NOTE — Progress Notes (Signed)
Fentanyl PCA discontinued. Remaining 15 ML of Fentanyl syringe wasted in sink with Rosey Batheresa, Charity fundraiserN as witness.

## 2017-01-18 ENCOUNTER — Inpatient Hospital Stay (HOSPITAL_COMMUNITY): Payer: BLUE CROSS/BLUE SHIELD

## 2017-01-18 DIAGNOSIS — J9601 Acute respiratory failure with hypoxia: Secondary | ICD-10-CM

## 2017-01-18 LAB — AEROBIC/ANAEROBIC CULTURE W GRAM STAIN (SURGICAL/DEEP WOUND)
Culture: NO GROWTH
Culture: NO GROWTH

## 2017-01-18 MED ORDER — LEVALBUTEROL HCL 1.25 MG/0.5ML IN NEBU: 1.2500 mg | INHALATION_SOLUTION | RESPIRATORY_TRACT | Status: DC | PRN

## 2017-01-18 NOTE — Evaluation (Signed)
Physical Therapy Evaluation Patient Details Name: Charlott Rakesrenton L Jodoin MRN: [id number] DOB: 08/10/1960 Today's Date: 01/18/2017   History of Present Illness  Pt is a 56 yo male admitted through ED on 01/07/17 with L chest and flank pain. Pt was diagnosed with ARF with hypoxia, CAP, and sepsis with leukocytosis. Pt underwent a VATS mini due on 01/13/17 with drainage of empyema and decortication of left lower lobe and chest tube placement. Chest tube was removed 01/17/17. PMH significant for HTN, Headache, depression.   Clinical Impression  Pt presents with the above diagnosis and below deficits for therapy evaluation. 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 Pt plans to return home with his mother at discharge. Pt requires supervision to min guard for all mobility this session and is expected to progress to Mod I or independent by DC. Pt will not require any therapy follow-up at discharge but my benefit from acute PT follow-up to ensure safe mobility prior to D/C.     Follow Up Recommendations No PT follow up    Equipment Recommendations  None recommended by PT    Recommendations for Other Services       Precautions / Restrictions Precautions Precautions: None Restrictions Weight Bearing Restrictions: No      Mobility  Bed Mobility Overal bed mobility: Modified Independent             General bed mobility comments: able to get EOB without any therapist assistance  Transfers Overall transfer level: Needs assistance Equipment used: None Transfers: Sit to/from Stand Sit to Stand: Supervision         General transfer comment: supervision for safety from EOB  Ambulation/Gait Ambulation/Gait assistance: Supervision Ambulation Distance (Feet): 500 Feet Assistive device: None Gait Pattern/deviations: Step-through pattern;Decreased stride length Gait velocity: decreased Gait velocity interpretation: Below normal speed for age/gender General  Gait Details: slow cadence and very narrow BOS. Cues for sequencing and upright posture throughout gait. O2 sats remain 98-100% throughout gait.   Stairs            Wheelchair Mobility    Modified Rankin (Stroke Patients Only)       Balance Overall balance assessment: Needs assistance Sitting-balance support: No upper extremity supported;Feet supported Sitting balance-Leahy Scale: Good     Standing balance support: No upper extremity supported;During functional activity Standing balance-Leahy Scale: Fair                               Pertinent Vitals/Pain Pain Assessment: No/denies pain    Home Living Family/patient expects to be discharged to:: Private residence Living Arrangements: Parent Available Help at Discharge: Family;Available 24 hours/day Type of Home: House Home Access: Stairs to enter   [employer] CorporationEntrance Stairs-Number of Steps: 1 Home Layout: One level Home Equipment: None      Prior Function Level of Independence: Independent         Comments: completely independent and working loading trucks prior to recent exacerbation     Hand Dominance   Dominant Hand: Left    Extremity/Trunk Assessment   Upper Extremity Assessment Upper Extremity Assessment: Defer to OT evaluation    Lower Extremity Assessment Lower Extremity Assessment: Generalized weakness    Cervical / Trunk Assessment Cervical / Trunk Assessment: Normal  Communication   Communication: No difficulties  Cognition Arousal/Alertness: Awake/alert Behavior During Therapy: WFL for tasks assessed/performed Overall Cognitive Status: Within Functional Limits for tasks assessed  General Comments      Exercises     Assessment/Plan    PT Assessment Patient needs continued PT services  PT Problem List Decreased strength;Decreased activity tolerance;Decreased balance;Decreased mobility       PT Treatment  Interventions DME instruction;Gait training;Functional mobility training;Therapeutic activities;Therapeutic exercise;Balance training;Patient/family education    PT Goals (Current goals can be found in the Care Plan section)  Acute Rehab PT Goals Patient Stated Goal: to go home with his mom PT Goal Formulation: With patient Time For Goal Achievement: 01/25/17 Potential to Achieve Goals: Good    Frequency Min 3X/week   Barriers to discharge        Co-evaluation               AM-PAC PT "6 Clicks" Daily Activity  Outcome Measure Difficulty turning over in bed (including adjusting bedclothes, sheets and blankets)?: None Difficulty moving from lying on back to sitting on the side of the bed? : None Difficulty sitting down on and standing up from a chair with arms (e.g., wheelchair, bedside commode, etc,.)?: Total Help needed moving to and from a bed to chair (including a wheelchair)?: A Little Help needed walking in hospital room?: A Little Help needed climbing 3-5 steps with a railing? : A Little 6 Click Score: 18    End of Session Equipment Utilized During Treatment: Gait belt Activity Tolerance: Patient tolerated treatment well Patient left: in bed;with call bell/phone within reach Nurse Communication: Mobility status PT Visit Diagnosis: Difficulty in walking, not elsewhere classified (R26.2);Muscle weakness (generalized) (M62.81)    Time: 4540-98111034-1055 PT Time Calculation (min) (ACUTE ONLY): 21 min   Charges:   PT Evaluation $PT Eval Moderate Complexity: 1 Mod     PT G Codes:        Colin BroachSabra M. Idan Prime PT, DPT  [phone]   Ruel FavorsSabra Aletha HalimMarie Celina Shiley 01/18/2017, 1:20 PM

## 2017-01-18 NOTE — Progress Notes (Signed)
[facility] TEAM 1 - Stepdown/ICU TEAM  Charlott Rakesrenton L Arnall  ZOX:[id number] DOB: 10/20/1960 DOA: 01/07/2017 PCP: Glori LuisSonnenberg, [name], MD    Brief Narrative:  56 year old male nonsmoker presented for evaluation of sudden onset of left pleuritic chest pain. CT ruled out PE but showed loculated left effusion and probable left lower lobe pneumonia. He was admitted and placed on IV antibiotics. His symptoms did not improve, w/ ongoing fever poor appetite and fatigue. White count rose to 17,000. A repeat CT showed a large loculated left pleural effusion with complete compression atelectasis of left lower lobe. Thoracentesis was nonproductive. TCTS ultimately took the pt for a VATS on 7/29.  Subjective: All chest tubes out.  Patient off PCA.  Reports pain is reasonably well controlled.  Denies f/c, n/v, or abdom pain.    Assessment & Plan:  Sepsis due to Left lower lobe pneumonia / left empyema S/p VATS 7/29 - ongoing care per TCTS - chest tubes out - no pneumothorax on follow-up chest x-ray with only small left pleural effusion persisting - no new micro data - stop vanc as MRSA screen negative - transition to oral abx at time of d/c   Prolonged QT - LBBB Choosing meds w/ care - persists on tele   Chest pain in setting of pneumonia  mild elevation in troponin likely due to demand ischemia - TTE with normal systolic function and no wall motion abnormalities  Essential hypertension BP controlled at present   Mild transaminitis  Improved on f/u   DVT prophylaxis: lovenox  Code Status: FULL CODE Family Communication: spoke w/ mother at bedside  Disposition Plan: ambulate - PT/OT  Consultants:  TCTS PCCM  Procedures: TTE 7/25 EF 60-65% - no WMA 7/29 VATS  Antimicrobials:  Rocephin 7/23 Unasyn 7/24 > 7/29 Doxycycline 7/27 > 7/29 Zosyn 7/29 > Vanc 7/29 > 8/2  Objective: Blood pressure 126/83, pulse 97, temperature 99.3 F (37.4 C), resp. rate 18, height 5\' 8"  (1.727 m), weight 79.1  kg (174 lb 6.1 oz), SpO2 97 %.  Intake/Output Summary (Last 24 hours) at 01/18/17 0953 Last data filed at 01/18/17 0610  Gross per 24 hour  Intake              240 ml  Output              925 ml  Net             -685 ml   Filed Weights   01/16/17 0542 01/17/17 0701 01/18/17 0400  Weight: 79.3 kg (174 lb 13.2 oz) 77.3 kg (170 lb 6.4 oz) 79.1 kg (174 lb 6.1 oz)    Examination: General: No acute respiratory distress Lungs: Clear to auscultation bilaterally - poor air movement in L base  Cardiovascular: Regular rate and rhythm without murmur gallop or rub normal S1 and S2 Abdomen: Nontender, nondistended, soft, bowel sounds positive, no rebound, no ascites, no appreciable mass Extremities: No significant cyanosis, clubbing, or edema bilateral lower extremities   CBC:  Recent Labs Lab 01/12/17 1621 01/13/17 0627 01/13/17 1644 01/14/17 0306 01/15/17 0218 01/17/17 0948  WBC 15.5* 15.1*  --  16.0* 13.3* 13.4*  NEUTROABS  --   --   --   --   --  9.8*  HGB 13.2 12.0* 10.9* 11.2* 11.3* 12.7*  HCT 39.5 36.7* 32.0* 34.6* 34.9* 40.3  MCV 86.4 86.2  --  88.3 87.0 88.0  PLT 355 332  --  349 397 625*   Basic Metabolic Panel:  Recent Labs Lab 01/12/17 1621 01/13/17 0627 01/13/17 1644 01/14/17 0306 01/15/17 0218 01/15/17 1603 01/17/17 0948  NA 139 138 139 138 138  --  140  K 3.9 3.6 3.6 4.1 3.4* 3.8 4.1  CL 105 106  --  105 104  --  103  CO2 25 24  --  25 26  --  27  GLUCOSE 103* 101*  --  197* 116*  --  104*  BUN 12 12  --  10 9  --  12  CREATININE 1.18 1.01  --  0.89 0.98  --  1.05  CALCIUM 8.5* 8.3*  --  8.3* 8.3*  --  9.2  MG  --   --   --   --   --  2.2 2.5*   GFR: Estimated Creatinine Clearance: 78.7 mL/min (by C-G formula based on SCr of 1.05 mg/dL).  Liver Function Tests:  Recent Labs Lab 01/12/17 1621 01/15/17 0218  AST 86* 38  ALT 87* 66*  ALKPHOS 127* 89  BILITOT 0.7 0.7  PROT 7.0 6.2*  ALBUMIN 2.6* 2.4*    Coagulation Profile:  Recent Labs Lab  01/12/17 1621  INR 1.17   HbA1C: Hgb A1c MFr Bld  Date/Time Value Ref Range Status  11/25/2015 09:05 AM 5.5 4.6 - 6.5 % Final    Comment:    Glycemic Control Guidelines for People with Diabetes:Non Diabetic:  <6%Goal of Therapy: <7%Additional Action Suggested:  >8%    CBG:  Recent Labs Lab 01/14/17 2003 01/14/17 2335 01/15/17 0352 01/15/17 0820 01/15/17 1213  GLUCAP 108* 107* 120* 103* 101*    Recent Results (from the past 240 hour(s))  MRSA PCR Screening     Status: None   Collection Time: 01/11/17 11:41 AM  Result Value Ref Range Status   MRSA by PCR NEGATIVE NEGATIVE Final    Comment:        The GeneXpert MRSA Assay (FDA approved for NASAL specimens only), is one component of a comprehensive MRSA colonization surveillance program. It is not intended to diagnose MRSA infection nor to guide or monitor treatment for MRSA infections.   Surgical pcr screen     Status: Abnormal   Collection Time: 01/12/17  4:42 PM  Result Value Ref Range Status   MRSA, PCR NEGATIVE NEGATIVE Final   Staphylococcus aureus POSITIVE (A) NEGATIVE Final    Comment:        The Xpert SA Assay (FDA approved for NASAL specimens in patients over 43 years of age), is one component of a comprehensive surveillance program.  Test performance has been validated by [facility] for patients greater than or equal to 21 year old. It is not intended to diagnose infection nor to guide or monitor treatment.   Aerobic/Anaerobic Culture (surgical/deep wound)     Status: None (Preliminary result)   Collection Time: 01/13/17  4:04 PM  Result Value Ref Range Status   Specimen Description LUNG LEFT TISSUE  Final   Special Requests SWAB SPEC A  Final   Gram Stain   Final    MODERATE WBC PRESENT,BOTH PMN AND MONONUCLEAR NO ORGANISMS SEEN    Culture   Final    NO GROWTH 4 DAYS NO ANAEROBES ISOLATED; CULTURE IN PROGRESS FOR 5 DAYS   Report Status PENDING  Incomplete  Fungus Culture With Stain      Status: None (Preliminary result)   Collection Time: 01/13/17  4:08 PM  Result Value Ref Range Status   Fungus Stain Final report  Final    Comment: (NOTE) Performed At: [facility] [address] [place], [place] [id number] Mila HomerHancock William F MD HQ:[id number]:[phone]    Fungus (Mycology) Culture PENDING  Incomplete   Fungal Source PLEURAL  Final    Comment: LEFT SPEC B   Acid Fast Smear (AFB)     Status: None   Collection Time: 01/13/17  4:08 PM  Result Value Ref Range Status   AFB Specimen Processing Concentration  Final   Acid Fast Smear Negative  Final    Comment: (NOTE) Performed At: [facility] [address] [place], [place] [id number] Mila HomerHancock William F MD VO:[id number]:[phone]    Source (AFB) PLEURAL  Final    Comment: LEFT SPEC B   Aerobic/Anaerobic Culture (surgical/deep wound)     Status: None (Preliminary result)   Collection Time: 01/13/17  4:08 PM  Result Value Ref Range Status   Specimen Description PLEURAL LEFT FLUID  Final   Special Requests SPEC B  Final   Gram Stain   Final    RARE WBC PRESENT, PREDOMINANTLY PMN NO ORGANISMS SEEN    Culture   Final    NO GROWTH 4 DAYS NO ANAEROBES ISOLATED; CULTURE IN PROGRESS FOR 5 DAYS   Report Status PENDING  Incomplete  Fungus Culture Result     Status: None   Collection Time: 01/13/17  4:08 PM  Result Value Ref Range Status   Result 1 Comment  Final    Comment: (NOTE) KOH/Calcofluor preparation:  no fungus observed. Performed At: [facility]  [address] [place], [place] [id number] Mila HomerHancock William F MD FI:[id number]:[phone]      Scheduled Meds: . acetaminophen  1,000 mg Oral Q6H   Or  . acetaminophen (TYLENOL) oral liquid 160 mg/5 mL  1,000 mg Oral Q6H  . aspirin EC  325 mg Oral Daily  . bisacodyl  10 mg Oral Daily  . enoxaparin (LOVENOX) injection  40 mg Subcutaneous Daily  . feeding supplement (ENSURE ENLIVE)  237 mL Oral TID BM  . mouth rinse  15 mL Mouth Rinse BID  . metoCLOPramide (REGLAN)  injection  10 mg Intravenous Q6H  . pantoprazole  40 mg Oral Daily  . senna-docusate  1 tablet Oral QHS     LOS: 11 days   Lonia BloodJeffrey T. McClung, MD [facility] Office  [phone] Pager - Text Page per Amion as per below:  On-Call/Text Page:      [email or web address]      password [email or web address]  If 7PM-7AM, please contact night-coverage [email or web address] Password [email or web address] 01/18/2017, 9:53 AM

## 2017-01-18 NOTE — Progress Notes (Addendum)
                    [address]            [place] [id number]          [phone]   5 Days Post-Op Procedure(s) (LRB): VIDEO ASSISTED THORACOSCOPY (VATS) DRAIN EMPYEMA (Left)  Total Length of Stay:  LOS: 11 days   Subjective: Patient sitting up at bedside about to eat breakfast.   Objective: Vital signs in last 24 hours: Temp:  [97.7 F (36.5 C)-99.3 F (37.4 C)] 99.3 F (37.4 C) (08/03 0400) Pulse Rate:  [92-120] 101 (08/03 0400) Cardiac Rhythm: Normal sinus rhythm (08/03 0700) Resp:  [16-18] 16 (08/03 0400) BP: (114-130)/(77-89) 118/85 (08/03 0400) SpO2:  [95 %-98 %] 95 % (08/03 0400) Weight:  [79.1 kg (174 lb 6.1 oz)] 79.1 kg (174 lb 6.1 oz) (08/03 0400)  Filed Weights   01/16/17 0542 01/17/17 0701 01/18/17 0400  Weight: 79.3 kg (174 lb 13.2 oz) 77.3 kg (170 lb 6.4 oz) 79.1 kg (174 lb 6.1 oz)      Intake/Output from previous day: 08/02 0701 - 08/03 0700 In: 480 [P.O.:480] Out: 1176 [Urine:1175; Stool:1]  Physical Exam: Heart: Slightly tachy this am Lungs: Slightly diminished at left base and clear on the right Extremities: No LE edema Wound: Clean and dry   Lab Results: CBC:  Recent Labs  01/17/17 0948  WBC 13.4*  HGB 12.7*  HCT 40.3  PLT 625*   BMET:   Recent Labs  01/15/17 1603 01/17/17 0948  NA  --  140  K 3.8 4.1  CL  --  103  CO2  --  27  GLUCOSE  --  104*  BUN  --  12  CREATININE  --  1.05  CALCIUM  --  9.2    CMET: Lab Results  Component Value Date   WBC 13.4 (H) 01/17/2017   HGB 12.7 (L) 01/17/2017   HCT 40.3 01/17/2017   PLT 625 (H) 01/17/2017   GLUCOSE 104 (H) 01/17/2017   CHOL 153 11/25/2015   TRIG 106.0 11/25/2015   HDL 35.50 (L) 11/25/2015   LDLCALC 96 11/25/2015   ALT 66 (H) 01/15/2017   AST 38 01/15/2017   NA 140 01/17/2017   K 4.1 01/17/2017   CL 103 01/17/2017   CREATININE 1.05 01/17/2017   BUN 12 01/17/2017   CO2 27 01/17/2017   TSH 1.01 11/25/2015   PSA 3.08 11/25/2015   INR 1.17  01/12/2017   HGBA1C 5.5 11/25/2015    Assessment/Plan: S/P Procedure(s) (LRB): VIDEO ASSISTED THORACOSCOPY (VATS) DRAIN EMPYEMA (Left)  1. CV-Slightly tachy in the 100's. 2. Pulmonary-  CXR shows no pneumothorax, small left pleural effusion, and right lung is clear. Encourage incentive spirometer.  3. ID-on Vancomycin and Zosyn for PNA. Cultures show no growth to date. Will likely transition to Augmentin at discharge. 4. Follow up appointment arranged, will see PRN   Elenore RotaZIMMERMAN,DONIELLE M PA-C 01/18/2017 7:36 AM   patient examined and medical record reviewed,agree with above note. Kathlee Nationseter Van Trigt III 01/18/2017

## 2017-01-18 NOTE — Evaluation (Addendum)
Occupational Therapy Evaluation and Discharge Patient Details Name: Caleb Avila L Stutsman MRN: [id number] DOB: 06/22/1960 Today's Date: 01/18/2017    History of Present Illness Pt is a 56 yo male admitted through ED on 01/07/17 with L chest and flank pain. Pt was diagnosed with ARF with hypoxia, CAP, and sepsis with leukocytosis. Pt underwent a VATS mini due on 01/13/17 with drainage of empyema and decortication of left lower lobe and chest tube placement. Chest tube was removed 01/17/17. PMH significant for HTN, Headache, depression.    Clinical Impression   Pt reports he was independent with ADL PTA. Currently pt overall supervision for ADL and functional mobility. Educated pt on energy conservation and gradually increasing activity; encouraged mobility with RN staff while in hospital. Pt planning to d/c home with 24/7 supervision from family initially. No further acute OT needs identified; signing off at this time. Please re-consult if needs change. Thank you for this referral.    Follow Up Recommendations  No OT follow up;Supervision - Intermittent    Equipment Recommendations  None recommended by OT    Recommendations for Other Services       Precautions / Restrictions Precautions Precautions: None Restrictions Weight Bearing Restrictions: No      Mobility Bed Mobility Overal bed mobility: Modified Independent               Transfers Overall transfer level: Needs assistance Equipment used: None Transfers: Sit to/from Stand Sit to Stand: Supervision         General transfer comment: for safety, no physical assist    Balance Overall balance assessment: No apparent balance deficits (not formally assessed)                                ADL either performed or assessed with clinical judgement   ADL Overall ADL's : Needs assistance/impaired Eating/Feeding: Set up;Sitting   Grooming: Supervision/safety;Standing   Upper Body Bathing: Set up;Sitting    Lower Body Bathing: Supervison/ safety;Sit to/from stand   Upper Body Dressing : Set up;Sitting   Lower Body Dressing: Supervision/safety;Sit to/from stand   Toilet Transfer: Supervision/safety;Ambulation;Regular Toilet   Toileting- ArchitectClothing Manipulation and Hygiene: Supervision/safety;Sit to/from stand   Tub/ Shower Transfer: Supervision/safety;Tub transfer;Ambulation Tub/Shower Transfer Details (indicate cue type and reason): Simulated in bathroom without difficulty or unsteadiness. Discussed use of shower chair for energy conservation if needed. Functional mobility during ADLs: Supervision/safety General ADL Comments: Educated pt on energy conservation strategies and gradually increasing activity level. Recommended walking with RN staff while in hospital if possible.     Vision         Perception     Praxis      Pertinent Vitals/Pain Pain Assessment: No/denies pain     Hand Dominance Left   Extremity/Trunk Assessment Upper Extremity Assessment Upper Extremity Assessment: Overall WFL for tasks assessed   Lower Extremity Assessment Lower Extremity Assessment: Defer to PT evaluation   Cervical / Trunk Assessment Cervical / Trunk Assessment: Normal   Communication Communication Communication: No difficulties   Cognition Arousal/Alertness: Awake/alert Behavior During Therapy: WFL for tasks assessed/performed Overall Cognitive Status: Within Functional Limits for tasks assessed                                     General Comments       Exercises     Shoulder Instructions  Home Living Family/patient expects to be discharged to:: Private residence Living Arrangements: Parent Available Help at Discharge: Family;Available 24 hours/day Type of Home: House Home Access: Stairs to enter Entergy CorporationEntrance Stairs-Number of Steps: 1   Home Layout: One level     Bathroom Shower/Tub: IT trainerTub/shower unit;Curtain   Bathroom Toilet: Standard     Home  Equipment: Shower seat;Grab bars - tub/shower   Additional Comments: Lives alone, planning to stay at mothers house initially.      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        OT Problem List:        OT Treatment/Interventions:      OT Goals(Current goals can be found in the care plan section) Acute Rehab OT Goals Patient Stated Goal: to go home with his mom OT Goal Formulation: All assessment and education complete, DC therapy  OT Frequency:     Barriers to D/C:            Co-evaluation              AM-PAC PT "6 Clicks" Daily Activity     Outcome Measure Help from another person eating meals?: None Help from another person taking care of personal grooming?: A Little Help from another person toileting, which includes using toliet, bedpan, or urinal?: A Little Help from another person bathing (including washing, rinsing, drying)?: A Little Help from another person to put on and taking off regular upper body clothing?: None Help from another person to put on and taking off regular lower body clothing?: A Little 6 Click Score: 20   End of Session    Activity Tolerance: Patient tolerated treatment well Patient left: with call bell/phone within reach;Other (comment) (sitting EOB)  OT Visit Diagnosis: Unsteadiness on feet (R26.81)                Time: [id number] OT Time Calculation (min): 11 min Charges:  OT General Charges $OT Visit: 1 Procedure OT Evaluation $OT Eval Low Complexity: 1 Procedure G-Codes:     Arlisa Leclere A. Brett Albinooffey, M.S., OTR/L Pager: [phone]  Gaye AlkenBailey A Capucine Tryon 01/18/2017, 1:46 PM

## 2017-01-19 ENCOUNTER — Inpatient Hospital Stay (HOSPITAL_COMMUNITY): Payer: BLUE CROSS/BLUE SHIELD

## 2017-01-19 ENCOUNTER — Encounter (HOSPITAL_COMMUNITY): Payer: Self-pay

## 2017-01-19 DIAGNOSIS — R000 Tachycardia, unspecified: Secondary | ICD-10-CM

## 2017-01-19 LAB — CBC
HCT: 39.4 % (ref 39.0–52.0)
Hemoglobin: 12.5 g/dL — ABNORMAL LOW (ref 13.0–17.0)
MCH: 28 pg (ref 26.0–34.0)
MCHC: 31.7 g/dL (ref 30.0–36.0)
MCV: 88.1 fL (ref 78.0–100.0)
PLATELETS: 698 10*3/uL — AB (ref 150–400)
RBC: 4.47 MIL/uL (ref 4.22–5.81)
RDW: 13.4 % (ref 11.5–15.5)
WBC: 15.3 10*3/uL — ABNORMAL HIGH (ref 4.0–10.5)

## 2017-01-19 LAB — D-DIMER, QUANTITATIVE (NOT AT ARMC): D DIMER QUANT: 6.3 ug{FEU}/mL — AB (ref 0.00–0.50)

## 2017-01-19 MED ORDER — TRAMADOL HCL 50 MG PO TABS: 50.0000 mg | ORAL_TABLET | Freq: Four times a day (QID) | ORAL | Status: DC | PRN

## 2017-01-19 MED ORDER — IOPAMIDOL (ISOVUE-370) INJECTION 76%
INTRAVENOUS | Status: AC
  Administered 2017-01-19: 100 mL
  Filled 2017-01-19: qty 100

## 2017-01-19 MED ORDER — OXYCODONE HCL 5 MG PO TABS: 5.0000 mg | ORAL_TABLET | ORAL | Status: DC | PRN

## 2017-01-19 MED ORDER — ACETAMINOPHEN 325 MG PO TABS: 650.0000 mg | ORAL_TABLET | Freq: Four times a day (QID) | ORAL | Status: DC | PRN

## 2017-01-19 NOTE — Plan of Care (Signed)
Problem: Safety: Goal: Ability to remain free from injury will improve Outcome: Progressing Patient reminded to call with any needs including when getting out of bed. Patient verbalized understanding and compliance

## 2017-01-19 NOTE — Progress Notes (Signed)
[place] TEAM 1 - Stepdown/ICU TEAM  Caleb Avila  ZOX:[id number] DOB: 1960/12/15 DOA: 01/07/2017 PCP: Glori Luis, MD    Brief Narrative:  56 year old male nonsmoker presented for evaluation of sudden onset of left pleuritic chest pain. CT ruled out PE but showed loculated left effusion and probable left lower lobe pneumonia. He was admitted and placed on IV antibiotics. His symptoms did not improve, w/ ongoing fever poor appetite and fatigue. White count rose to 17,000. A repeat CT showed a large loculated left pleural effusion with complete compression atelectasis of left lower lobe. Thoracentesis was nonproductive. TCTS ultimately took the pt for a VATS on 7/29.  Subjective: The patient states he's been trying to get up and get around more today.  He reports pain in his chest with heavy cough but otherwise no pain.  He denies calf tenderness or leg swelling.  He denies nausea vomiting or headache.  Assessment & Plan:  Sepsis due to Left lower lobe pneumonia / left empyema S/p VATS 7/29 - chest tubes out - no pneumothorax on follow-up chest x-ray with only small left pleural effusion persisting - no new micro data - stop vanc as MRSA screen negative - transition to oral abx at time of d/c   Sinus tachycardia  Appears to have developed acutely as of ~08:00 today - has been on lovenox but still concerning for possible PE - may simply be related to deconditioning and increased activity - no fever - check d-dimer - if elevated will check CTa chest   Prolonged QT - LBBB Choosing meds w/ care - persists on tele   Chest pain in setting of pneumonia  mild elevation in troponin likely due to demand ischemia - TTE with normal systolic function and no wall motion abnormalities  Essential hypertension BP controlled at present   Mild transaminitis  Improved on f/u   DVT prophylaxis: lovenox  Code Status: FULL CODE Family Communication: No family present at time of exam  today Disposition Plan: ambulate - PT/OT - evaluated for PE - if no PE possible d/c home 8/5  Consultants:  TCTS PCCM  Procedures: TTE 7/25 EF 60-65% - no WMA 7/29 VATS  Antimicrobials:  Rocephin 7/23 Unasyn 7/24 > 7/29 Doxycycline 7/27 > 7/29 Zosyn 7/29 > Vanc 7/29 > 8/2  Objective: Blood pressure 134/86, pulse (!) 107, temperature 98.4 F (36.9 C), temperature source Oral, resp. rate 20, height 5\' 8"  (1.727 m), weight 76.2 kg (167 lb 14.4 oz), SpO2 100 %.  Intake/Output Summary (Last 24 hours) at 01/19/17 1606 Last data filed at 01/19/17 1100  Gross per 24 hour  Intake                0 ml  Output             1751 ml  Net            -1751 ml   Filed Weights   01/17/17 0701 01/18/17 0400 01/19/17 0632  Weight: 77.3 kg (170 lb 6.4 oz) 79.1 kg (174 lb 6.1 oz) 76.2 kg (167 lb 14.4 oz)    Examination: General: No acute respiratory distress Lungs: Clear to auscultation B Cardiovascular: Tachycardic but regular with no rub or murmur Abdomen: NT/ND, soft, bs+, no mass  Extremities: No edema bilateral lower extremities   CBC:  Recent Labs Lab 01/13/17 0627 01/13/17 1644 01/14/17 0306 01/15/17 0218 01/17/17 0948 01/19/17 0304  WBC 15.1*  --  16.0* 13.3* 13.4* 15.3*  NEUTROABS  --   --   --   --  9.8*  --   HGB 12.0* 10.9* 11.2* 11.3* 12.7* 12.5*  HCT 36.7* 32.0* 34.6* 34.9* 40.3 39.4  MCV 86.2  --  88.3 87.0 88.0 88.1  PLT 332  --  349 397 625* 698*   Basic Metabolic Panel:  Recent Labs Lab 01/12/17 1621 01/13/17 0627 01/13/17 1644 01/14/17 0306 01/15/17 0218 01/15/17 1603 01/17/17 0948  NA 139 138 139 138 138  --  140  K 3.9 3.6 3.6 4.1 3.4* 3.8 4.1  CL 105 106  --  105 104  --  103  CO2 25 24  --  25 26  --  27  GLUCOSE 103* 101*  --  197* 116*  --  104*  BUN 12 12  --  10 9  --  12  CREATININE 1.18 1.01  --  0.89 0.98  --  1.05  CALCIUM 8.5* 8.3*  --  8.3* 8.3*  --  9.2  MG  --   --   --   --   --  2.2 2.5*   GFR: Estimated Creatinine  Clearance: 78.7 mL/min (by C-G formula based on SCr of 1.05 mg/dL).  Liver Function Tests:  Recent Labs Lab 01/12/17 1621 01/15/17 0218  AST 86* 38  ALT 87* 66*  ALKPHOS 127* 89  BILITOT 0.7 0.7  PROT 7.0 6.2*  ALBUMIN 2.6* 2.4*    Coagulation Profile:  Recent Labs Lab 01/12/17 1621  INR 1.17   HbA1C: Hgb A1c MFr Bld  Date/Time Value Ref Range Status  11/25/2015 09:05 AM 5.5 4.6 - 6.5 % Final    Comment:    Glycemic Control Guidelines for People with Diabetes:Non Diabetic:  <6%Goal of Therapy: <7%Additional Action Suggested:  >8%    CBG:  Recent Labs Lab 01/14/17 2003 01/14/17 2335 01/15/17 0352 01/15/17 0820 01/15/17 1213  GLUCAP 108* 107* 120* 103* 101*    Recent Results (from the past 240 hour(s))  MRSA PCR Screening     Status: None   Collection Time: 01/11/17 11:41 AM  Result Value Ref Range Status   MRSA by PCR NEGATIVE NEGATIVE Final    Comment:        The GeneXpert MRSA Assay (FDA approved for NASAL specimens only), is one component of a comprehensive MRSA colonization surveillance program. It is not intended to diagnose MRSA infection nor to guide or monitor treatment for MRSA infections.   Surgical pcr screen     Status: Abnormal   Collection Time: 01/12/17  4:42 PM  Result Value Ref Range Status   MRSA, PCR NEGATIVE NEGATIVE Final   Staphylococcus aureus POSITIVE (A) NEGATIVE Final    Comment:        The Xpert SA Assay (FDA approved for NASAL specimens in patients over 54 years of age), is one component of a comprehensive surveillance program.  Test performance has been validated by [facility] for patients greater than or equal to 287 year old. It is not intended to diagnose infection nor to guide or monitor treatment.   Aerobic/Anaerobic Culture (surgical/deep wound)     Status: None   Collection Time: 01/13/17  4:04 PM  Result Value Ref Range Status   Specimen Description LUNG LEFT TISSUE  Final   Special Requests SWAB SPEC  A  Final   Gram Stain   Final    MODERATE WBC PRESENT,BOTH PMN AND MONONUCLEAR NO ORGANISMS SEEN    Culture No growth aerobically or anaerobically.  Final   Report Status 01/18/2017 FINAL  Final  Fungus Culture With Stain     Status: None (Preliminary result)   Collection Time: 01/13/17  4:08 PM  Result Value Ref Range Status   Fungus Stain Final report  Final    Comment: (NOTE) Performed At: [facility] [address] [place], [place] [id number] Mila HomerHancock William F MD NW:[id number]:[phone]    Fungus (Mycology) Culture PENDING  Incomplete   Fungal Source PLEURAL  Final    Comment: LEFT SPEC B   Acid Fast Smear (AFB)     Status: None   Collection Time: 01/13/17  4:08 PM  Result Value Ref Range Status   AFB Specimen Processing Concentration  Final   Acid Fast Smear Negative  Final    Comment: (NOTE) Performed At: [facility] [address] [place], [place] [id number] Mila HomerHancock William F MD BM:[id number]:[phone]    Source (AFB) PLEURAL  Final    Comment: LEFT SPEC B   Aerobic/Anaerobic Culture (surgical/deep wound)     Status: None   Collection Time: 01/13/17  4:08 PM  Result Value Ref Range Status   Specimen Description PLEURAL LEFT FLUID  Final   Special Requests SPEC B  Final   Gram Stain   Final    RARE WBC PRESENT, PREDOMINANTLY PMN NO ORGANISMS SEEN    Culture No growth aerobically or anaerobically.  Final   Report Status 01/18/2017 FINAL  Final  Fungus Culture Result     Status: None   Collection Time: 01/13/17  4:08 PM  Result Value Ref Range Status   Result 1 Comment  Final    Comment: (NOTE) KOH/Calcofluor preparation:  no fungus observed. Performed At: [facility] [address] [place], [place] [id number] Mila HomerHancock William F MD IH:[id number]:[phone]      Scheduled Meds: . aspirin EC  325 mg Oral Daily  . bisacodyl  10 mg Oral Daily  . enoxaparin (LOVENOX) injection  40 mg Subcutaneous Daily  . feeding supplement (ENSURE ENLIVE)  237 mL Oral TID BM  .  mouth rinse  15 mL Mouth Rinse BID  . metoCLOPramide (REGLAN) injection  10 mg Intravenous Q6H  . pantoprazole  40 mg Oral Daily  . senna-docusate  1 tablet Oral QHS     LOS: 12 days   Lonia BloodJeffrey T. Jeanette Rauth, MD [facility] Office  [phone] Pager - Text Page per Amion as per below:  On-Call/Text Page:      [email or web address]      password [email or web address]  If 7PM-7AM, please contact night-coverage [email or web address] Password [email or web address] 01/19/2017, 4:06 PM

## 2017-01-20 LAB — CBC
HCT: 37.3 % — ABNORMAL LOW (ref 39.0–52.0)
Hemoglobin: 12.3 g/dL — ABNORMAL LOW (ref 13.0–17.0)
MCH: 28.7 pg (ref 26.0–34.0)
MCHC: 33 g/dL (ref 30.0–36.0)
MCV: 87.1 fL (ref 78.0–100.0)
PLATELETS: 648 10*3/uL — AB (ref 150–400)
RBC: 4.28 MIL/uL (ref 4.22–5.81)
RDW: 13 % (ref 11.5–15.5)
WBC: 14 10*3/uL — ABNORMAL HIGH (ref 4.0–10.5)

## 2017-01-20 MED ORDER — ASPIRIN 325 MG PO TBEC: 325.0000 mg | DELAYED_RELEASE_TABLET | Freq: Every day | ORAL | 0 refills | Status: AC

## 2017-01-20 MED ORDER — ACETAMINOPHEN 325 MG PO TABS: 650.0000 mg | ORAL_TABLET | Freq: Four times a day (QID) | ORAL | Status: DC | PRN

## 2017-01-20 MED ORDER — AMOXICILLIN-POT CLAVULANATE 875-125 MG PO TABS
1.0000 | ORAL_TABLET | Freq: Two times a day (BID) | ORAL | Status: DC
  Administered 2017-01-20: 1 via ORAL
  Filled 2017-01-20: qty 1

## 2017-01-20 MED ORDER — TRAMADOL HCL 50 MG PO TABS: 50.0000 mg | ORAL_TABLET | Freq: Four times a day (QID) | ORAL | 0 refills | Status: DC | PRN

## 2017-01-20 MED ORDER — AMOXICILLIN-POT CLAVULANATE 875-125 MG PO TABS: 1.0000 | ORAL_TABLET | Freq: Two times a day (BID) | ORAL | 0 refills | Status: DC

## 2017-01-20 NOTE — Discharge Summary (Signed)
DISCHARGE SUMMARY  Caleb Avila  MR#: [id number]  DOB:05/14/1961  Date of Admission: 01/07/2017 Date of Discharge: 01/20/2017  Attending Physician:Caleb Avila  Patient's WUJ:[id number]:Sonnenberg, [name], MD  Consults: TCTS PCCM  Disposition: D/C home   Follow-up Appts: Follow-up Information    Caleb PernaVan Trigt, Peter, MD Follow up on 02/13/2017.   Specialty:  Cardiothoracic Surgery Why:  PA/LAT CXR to be taken (at [facility] which is in the same building as Dr. Zenaida NieceVan Trigt's office) on 02/13/2017 at 1:30 pm ;Appointment time is at 2:00 pm Contact information: [address] [place] [address] [phone]        Nurse Follow up on 01/29/2017.   Why:  Appointment is with nurse only for chest tube suture removal only. Appointment time is at 10:30 am Contact information: [address] [place] [address]          Discharge Diagnoses: Sepsis due to Left lower lobe pneumonia / left empyema Sinus tachycardia  Prolonged QT - LBBB Chest pain in setting of pneumonia  Essential hypertension Mild transaminitis   Initial presentation: 56 year old male nonsmoker presented for evaluation of sudden onset of left pleuritic chest pain. CT ruled out PE but showed loculated left effusion and probable left lower lobe pneumonia. He was admitted and placed on IV antibiotics. His symptoms did not improve, w/ ongoing fever poor appetite and fatigue. White count rose to 17,000. A repeat CT showed a large loculated left pleural effusion with complete compression atelectasis of left lower lobe. Thoracentesis was nonproductive. TCTS ultimately took the pt for a VATS on 7/29.  Hospital Course:  Sepsis due to Left lower lobe pneumonia / left empyema S/p VATS 7/29 - chest tubes out - no pneumothorax on follow-up chest x-ray with only small left pleural effusion persisting - no new micro data - stopped vanc as MRSA screen negative - transitioned to oral abx at  time of d/c to complete an additional 7 days of tx (completing 12 days total post VATS)  Sinus tachycardia  developed acutely as of ~08:00 8/4 - likely related to deconditioning and increased activity - CTa chest w/o evidence of PE or worsening infection    Prolonged QT - LBBB Choosing meds w/ care - persists on tele   Chest pain in setting of pneumonia  mild elevation in troponin likely due to demand ischemia - TTE with normal systolic function and no wall motion abnormalities  Essential hypertension BP controlled at time of d/c   Mild transaminitis  Improved on f/u   Allergies as of 01/20/2017   No Known Allergies     Medication List    STOP taking these medications   acetaminophen 500 MG chewable tablet Commonly known as:  TYLENOL Replaced by:  acetaminophen 325 MG tablet   amLODipine 5 MG tablet Commonly known as:  NORVASC   naproxen sodium 220 MG tablet Commonly known as:  ANAPROX     TAKE these medications   acetaminophen 325 MG tablet Commonly known as:  TYLENOL Take 2 tablets (650 mg total) by mouth every 6 (six) hours as needed for mild pain, fever or headache. Replaces:  acetaminophen 500 MG chewable tablet   amoxicillin-clavulanate 875-125 MG tablet Commonly known as:  AUGMENTIN Take 1 tablet by mouth every 12 (twelve) hours.   aspirin 325 MG EC tablet Take 1 tablet (325 mg total) by mouth daily.   ibuprofen 200 MG tablet Commonly known as:  ADVIL,MOTRIN Take 400 mg by mouth  every 6 (six) hours as needed for mild pain.   multivitamin tablet Take 1 tablet by mouth daily.   traMADol 50 MG tablet Commonly known as:  ULTRAM Take 1-2 tablets (50-100 mg total) by mouth every 6 (six) hours as needed for moderate pain.       Day of Discharge BP 122/87 (BP Location: Left Arm)   Pulse 90   Temp 98.1 F (36.7 C) (Oral)   Resp (!) 23   Ht 5\' 8"  (1.727 m)   Wt 75.9 kg (167 lb 4.8 oz)   SpO2 97%   BMI 25.44 kg/m   Physical Exam: General: No  acute respiratory distress Lungs: Clear to auscultation bilaterally without wheezes or crackles Cardiovascular: Regular rate and rhythm without murmur gallop or rub normal S1 and S2 Abdomen: Nontender, nondistended, soft, bowel sounds positive, no rebound, no ascites, no appreciable mass Extremities: No significant cyanosis, clubbing, or edema bilateral lower extremities  Basic Metabolic Panel:  Recent Labs Lab 01/13/17 1644 01/14/17 0306 01/15/17 0218 01/15/17 1603 01/17/17 0948  NA 139 138 138  --  140  K 3.6 4.1 3.4* 3.8 4.1  CL  --  105 104  --  103  CO2  --  25 26  --  27  GLUCOSE  --  197* 116*  --  104*  BUN  --  10 9  --  12  CREATININE  --  0.89 0.98  --  1.05  CALCIUM  --  8.3* 8.3*  --  9.2  MG  --   --   --  2.2 2.5*    Liver Function Tests:  Recent Labs Lab 01/15/17 0218  AST 38  ALT 66*  ALKPHOS 89  BILITOT 0.7  PROT 6.2*  ALBUMIN 2.4*    CBC:  Recent Labs Lab 01/14/17 0306 01/15/17 0218 01/17/17 0948 01/19/17 0304 01/20/17 0217  WBC 16.0* 13.3* 13.4* 15.3* 14.0*  NEUTROABS  --   --  9.8*  --   --   HGB 11.2* 11.3* 12.7* 12.5* 12.3*  HCT 34.6* 34.9* 40.3 39.4 37.3*  MCV 88.3 87.0 88.0 88.1 87.1  PLT 349 397 625* 698* 648*    CBG:  Recent Labs Lab 01/14/17 2003 01/14/17 2335 01/15/17 0352 01/15/17 0820 01/15/17 1213  GLUCAP 108* 107* 120* 103* 101*    Recent Results (from the past 240 hour(s))  MRSA PCR Screening     Status: None   Collection Time: 01/11/17 11:41 AM  Result Value Ref Range Status   MRSA by PCR NEGATIVE NEGATIVE Final    Comment:        The GeneXpert MRSA Assay (FDA approved for NASAL specimens only), is one component of a comprehensive MRSA colonization surveillance program. It is not intended to diagnose MRSA infection nor to guide or monitor treatment for MRSA infections.   Surgical pcr screen     Status: Abnormal   Collection Time: 01/12/17  4:42 PM  Result Value Ref Range Status   MRSA, PCR  NEGATIVE NEGATIVE Final   Staphylococcus aureus POSITIVE (A) NEGATIVE Final    Comment:        The Xpert SA Assay (FDA approved for NASAL specimens in patients over 54 years of age), is one component of a comprehensive surveillance program.  Test performance has been validated by [facility] for patients greater than or equal to 54 year old. It is not intended to diagnose infection nor to guide or monitor treatment.   Aerobic/Anaerobic Culture (surgical/deep wound)  Status: None   Collection Time: 01/13/17  4:04 PM  Result Value Ref Range Status   Specimen Description LUNG LEFT TISSUE  Final   Special Requests SWAB SPEC A  Final   Gram Stain   Final    MODERATE WBC PRESENT,BOTH PMN AND MONONUCLEAR NO ORGANISMS SEEN    Culture No growth aerobically or anaerobically.  Final   Report Status 01/18/2017 FINAL  Final  Fungus Culture With Stain     Status: None (Preliminary result)   Collection Time: 01/13/17  4:08 PM  Result Value Ref Range Status   Fungus Stain Final report  Final    Comment: (NOTE) Performed At: [facility] [address] [place], [place] [id number] Mila Homer MD WU:[id number]    Fungus (Mycology) Culture PENDING  Incomplete   Fungal Source PLEURAL  Final    Comment: LEFT SPEC B   Acid Fast Smear (AFB)     Status: None   Collection Time: 01/13/17  4:08 PM  Result Value Ref Range Status   AFB Specimen Processing Concentration  Final   Acid Fast Smear Negative  Final    Comment: (NOTE) Performed At: [facility] [address] [place], [place] [id number] Mila Homer MD VH:[id number]    Source (AFB) PLEURAL  Final    Comment: LEFT SPEC B   Aerobic/Anaerobic Culture (surgical/deep wound)     Status: None   Collection Time: 01/13/17  4:08 PM  Result Value Ref Range Status   Specimen Description PLEURAL LEFT FLUID  Final   Special Requests SPEC B  Final   Gram Stain   Final    RARE WBC PRESENT, PREDOMINANTLY PMN NO  ORGANISMS SEEN    Culture No growth aerobically or anaerobically.  Final   Report Status 01/18/2017 FINAL  Final  Fungus Culture Result     Status: None   Collection Time: 01/13/17  4:08 PM  Result Value Ref Range Status   Result 1 Comment  Final    Comment: (NOTE) KOH/Calcofluor preparation:  no fungus observed. Performed At: [facility] [address] [place], [place] [id number] Mila Homer MD UV:[id number]      Time spent in discharge (includes decision making & examination of pt): 35 minutes  01/20/2017, 11:52 AM   Lonia Blood, MD [facility] Office  [phone] Pager [phone]  On-Call/Text Page:      [email or web address]      password [facility]

## 2017-01-20 NOTE — Progress Notes (Signed)
   01/20/17  To Whom it may concern,  Caleb Avila was hospitalized [facility] from 01/07/2017 through 01/20/17.  He has been advised that he should remain at home and slowly advance his activity level until at least 01/25/17.  He is not medically fit to perform jury duty at the present time.    Should you have further questions please feel free to contact our office.  Please be advised that no medical information will be divulged without a signed HIPA release from the patient.    Sincerely,    Lonia BloodJeffrey T. Arless Vineyard, MD [facility] Office  [phone]

## 2017-01-29 ENCOUNTER — Ambulatory Visit (INDEPENDENT_AMBULATORY_CARE_PROVIDER_SITE_OTHER): Payer: Self-pay

## 2017-01-29 DIAGNOSIS — Z4802 Encounter for removal of sutures: Secondary | ICD-10-CM

## 2017-01-29 NOTE — Progress Notes (Signed)
Removed 3 sutures from incision sites with no signs of infection and patient tolerated well. He is scheduled to return on 02/13/17 with CXR.

## 2017-02-12 ENCOUNTER — Other Ambulatory Visit: Payer: Self-pay | Admitting: Cardiothoracic Surgery

## 2017-02-12 DIAGNOSIS — J90 Pleural effusion, not elsewhere classified: Secondary | ICD-10-CM

## 2017-02-12 LAB — FUNGUS CULTURE WITH STAIN

## 2017-02-12 LAB — FUNGAL ORGANISM REFLEX

## 2017-02-12 LAB — FUNGUS CULTURE RESULT

## 2017-02-13 ENCOUNTER — Encounter: Payer: Self-pay | Admitting: Cardiothoracic Surgery

## 2017-02-13 ENCOUNTER — Ambulatory Visit (INDEPENDENT_AMBULATORY_CARE_PROVIDER_SITE_OTHER): Payer: Self-pay | Admitting: Cardiothoracic Surgery

## 2017-02-13 ENCOUNTER — Ambulatory Visit
Admission: RE | Admit: 2017-02-13 | Discharge: 2017-02-13 | Disposition: A | Discharge status: Home / Self Care | Payer: BLUE CROSS/BLUE SHIELD | Source: Ambulatory Visit | Attending: Cardiothoracic Surgery | Admitting: Cardiothoracic Surgery

## 2017-02-13 VITALS — BP 125/87 | HR 79 | Resp 20 | Ht 67.75 in | Wt 172.0 lb

## 2017-02-13 DIAGNOSIS — J869 Pyothorax without fistula: Secondary | ICD-10-CM

## 2017-02-13 DIAGNOSIS — Z09 Encounter for follow-up examination after completed treatment for conditions other than malignant neoplasm: Secondary | ICD-10-CM

## 2017-02-13 DIAGNOSIS — J90 Pleural effusion, not elsewhere classified: Secondary | ICD-10-CM

## 2017-02-13 DIAGNOSIS — J9811 Atelectasis: Secondary | ICD-10-CM | POA: Diagnosis not present

## 2017-02-13 NOTE — Progress Notes (Signed)
PCP is [name], [name], MD Referring Provider is [name], Wesam G, MD  Chief Complaint  Patient presents with  . Routine Post Op    f/u from surgery with CXR s/p Left VATS-mini thoracotomy, drainage of L empyema.[name].[name].01/13/17 ...with a CXR    HPI: Patient returns for scheduled office visit with chest x-ray one month after left VATS for drainage of empyema. Pathology was fibropurulent material no malignancy. Cytology was negative. Acid fast cultures are negative. The patient finished a oral course of Augmentin. The patient has recovered. He has usual postthoracotomy discomfort. He is not taking narcotics. The incision is well-healed. He has no fever or productive cough. Chest x-ray shows resolution of the empyema with some residual blunting of the costophrenic angle from pleural thickening on the left side no further x-rays or antibiotics are needed. The patient was given a return to work note for September 4.  Past Medical History:  Diagnosis Date  . Depression   . Frequent headaches   . Hypertension     Past Surgical History:  Procedure Laterality Date  . chest wall cyst    . FINGER SURGERY Right   . VIDEO ASSISTED THORACOSCOPY (VATS)/EMPYEMA Left 01/13/2017   Procedure: VIDEO ASSISTED THORACOSCOPY (VATS) DRAIN EMPYEMA;  Surgeon: [name], [name], MD;  [facility];  Service: Thoracic;  Laterality: Left;    Family History  Problem Relation Age of Onset  . Depression Unknown     Social History Social History  Substance Use Topics  . Smoking status: Never Smoker  . Smokeless tobacco: Never Used  . Alcohol use No    Current Outpatient Prescriptions  Medication Sig Dispense Refill  . acetaminophen (TYLENOL) 325 MG tablet Take 2 tablets (650 mg total) by mouth every 6 (six) hours as needed for mild pain, fever or headache.    [name]. aspirin EC 325 MG EC tablet Take 1 tablet (325 mg total) by mouth daily. 30 tablet 0  . ibuprofen (ADVIL,MOTRIN) 200 MG tablet Take 400 mg by mouth  every 6 (six) hours as needed for mild pain.    . Multiple Vitamin (MULTIVITAMIN) tablet Take 1 tablet by mouth daily.    . traMADol (ULTRAM) 50 MG tablet Take 1-2 tablets (50-100 mg total) by mouth every 6 (six) hours as needed for moderate pain. 30 tablet 0   No current facility-administered medications for this visit.     No Known Allergies  Review of Systems  No fever Improved strength appetite  BP 125/87   Pulse 79   Resp 20   Ht 5' 7.75" (1.721 m)   Wt 172 lb (78 kg)   SpO2 98%   BMI 26.35 kg/m  Physical Exam      Exam    General- alert and comfortable   Lungs- clear without rales, wheezes   Cor- regular rate and rhythm, no murmur , gallop   Abdomen- soft, non-tender   Extremities - warm, non-tender, minimal edema   Neuro- oriented, appropriate, no focal weakness   Diagnostic Tests:  Chest x-ray clear with mild blunting of left costophrenic angle from pleural thickening Impression: Excellent recovery after left VATS for drainage of empyema  Plan: Patient may return to work and normal activities. No more narcotic prescription provided-patient will use Tylenol as needed Caleb BussingPeter Van Trigt III, MD [facility] [phone]

## 2017-02-19 ENCOUNTER — Telehealth: Payer: Self-pay

## 2017-02-19 NOTE — Telephone Encounter (Signed)
[facility] on am[facility]ine last OV  12/07/15 last filled 12/06/16 90 3rf

## 2017-02-20 NOTE — Telephone Encounter (Signed)
Medication was discontinued in ER

## 2017-02-20 NOTE — Telephone Encounter (Signed)
Why is this not on his med list?

## 2017-02-25 ENCOUNTER — Other Ambulatory Visit: Payer: Self-pay

## 2017-02-25 DIAGNOSIS — I10 Essential (primary) hypertension: Secondary | ICD-10-CM

## 2017-02-25 NOTE — Telephone Encounter (Signed)
Patient recently hospitalized. It appears in his discharge summary they discontinued the amlodipine. His most recent blood pressure with cardiothoracic surgery appears to have been well controlled. Please check with patient to see if he's been taking the amlodipine and get him set up for follow-up. Thanks.

## 2017-02-25 NOTE — Telephone Encounter (Signed)
Pharmacy is requesting 90 day refill, last OV 12/07/15 no appointments scheduled. Last filled 11/22/16 30 1rf

## 2017-02-26 LAB — ACID FAST CULTURE WITH REFLEXED SENSITIVITIES (MYCOBACTERIA): Acid Fast Culture: NEGATIVE

## 2017-02-26 NOTE — Telephone Encounter (Signed)
Left message to return call 

## 2017-02-26 NOTE — Telephone Encounter (Signed)
Patient states he does still take this, patient is scheduled for physical

## 2017-02-27 MED ORDER — AMLODIPINE BESYLATE 5 MG PO TABS: 5.0000 mg | ORAL_TABLET | Freq: Every day | ORAL | 1 refills | Status: DC

## 2017-04-16 ENCOUNTER — Encounter: Payer: Self-pay | Admitting: Family Medicine

## 2017-04-16 ENCOUNTER — Ambulatory Visit (INDEPENDENT_AMBULATORY_CARE_PROVIDER_SITE_OTHER): Payer: BLUE CROSS/BLUE SHIELD | Admitting: Family Medicine

## 2017-04-16 VITALS — BP 126/82 | HR 82 | Temp 98.1°F | Ht 68.0 in | Wt 187.8 lb

## 2017-04-16 DIAGNOSIS — Z125 Encounter for screening for malignant neoplasm of prostate: Secondary | ICD-10-CM | POA: Diagnosis not present

## 2017-04-16 DIAGNOSIS — Z0000 Encounter for general adult medical examination without abnormal findings: Secondary | ICD-10-CM

## 2017-04-16 DIAGNOSIS — Z13220 Encounter for screening for lipoid disorders: Secondary | ICD-10-CM

## 2017-04-16 DIAGNOSIS — E663 Overweight: Secondary | ICD-10-CM | POA: Diagnosis not present

## 2017-04-16 NOTE — Assessment & Plan Note (Signed)
Encourage diet and exercise. Cologuard will be done. He'll check with his insurance to make sure it's covered. Advised not to provide a sample if it is not covered. Prostate exam normal. PSA is ordered. He will check regarding the shingles vaccine with his insurance. Encouraged him to stay active and monitor his ribs. If they do not continue to improve he should let his surgeon know. Discussed that rib injuries or operations involving the ribs are very slow to heal given inability to rest them. Labs as outlined below.

## 2017-04-16 NOTE — Progress Notes (Signed)
Tommi Rumps, MD Phone: [phone]  Caleb Avila is a 56 y.o. male who presents today for new patient visit.  Exercises by walking 1 mile per day. Notes he is built up to that after having pneumonia over the summer. He underwent VATS for that and has been recovering fairly well. Does note some soreness in his left ribs that hurts with certain movements. That has slowly been improving. He notes he is trying to eat better. Drinks soda once a day. Eating a fair bit of chocolate. No prior colon cancer screening. No family history of colon cancer. No rectal bleeding. Has been doing prostate cancer screening intermittently. Tetanus vaccination up-to-date. HIV up-to-date. Flu shot up-to-date. Hepatitis C up-to-date. Shingles vaccine needs to be done. He thinks he's had shingles was previously.  No tobacco use, illicit drug use. Rare alcohol use. Reports he has recovered well from his pneumonia. Back to exercising.  Active Ambulatory Problems    Diagnosis Date Noted  . Routine general medical examination at a health care facility 11/04/2015  . Essential hypertension 11/04/2015  . Constipation 11/04/2015  . Heat intolerance 11/04/2015  . Cutaneous skin tags 12/07/2015  . Multifocal pneumonia 01/07/2017  . Pleural effusion   . Empyema lung ([place]) 01/13/2017   Resolved Ambulatory Problems    Diagnosis Date Noted  . CAP (community acquired pneumonia) 01/07/2017  . Sepsis ([place]) 01/07/2017  . Acute respiratory failure with hypoxia ([place]) 01/07/2017  . SOB (shortness of breath)   . Empyema ([place])   . Hypoxemia    Past Medical History:  Diagnosis Date  . Depression   . Frequent headaches   . Hypertension     Family History  Problem Relation Age of Onset  . Depression Unknown     Social History   Social History  . Marital status: Single    Spouse name: N/A  . Number of children: N/A  . Years of education: N/A   Occupational History  . Not on file.   Social History  Main Topics  . Smoking status: Never Smoker  . Smokeless tobacco: Never Used  . Alcohol use No  . Drug use: No  . Sexual activity: Not on file   Other Topics Concern  . Not on file   Social History Narrative  . No narrative on file    ROS  General:  Negative for nexplained weight loss, fever Skin: Negative for new or changing mole, sore that won't heal HEENT: Negative for trouble hearing, trouble seeing, ringing in ears, mouth sores, hoarseness, change in voice, dysphagia. CV:  Negative for chest pain, dyspnea, edema, palpitations Resp: Negative for cough, dyspnea, hemoptysis GI: Negative for nausea, vomiting, diarrhea, constipation, abdominal pain, melena, hematochezia. GU: Negative for dysuria, incontinence, urinary hesitance, hematuria, vaginal or penile discharge, polyuria, sexual difficulty, lumps in testicle or breasts MSK: Positive for muscle cramps or aches, negative for joint pain or swelling Neuro: Negative for headaches, weakness, numbness, dizziness, passing out/fainting Psych: Negative for depression, anxiety, memory problems  Objective  Physical Exam Vitals:   04/16/17 1600  BP: 126/82  Pulse: 82  Temp: 98.1 F (36.7 C)  SpO2: 96%    BP Readings from Last 3 Encounters:  04/16/17 126/82  02/13/17 125/87  01/20/17 122/87   Wt Readings from Last 3 Encounters:  04/16/17 187 lb 12.8 oz (85.2 kg)  02/13/17 172 lb (78 kg)  01/20/17 167 lb 4.8 oz (75.9 kg)    Physical Exam  Constitutional: No distress.  HENT:  Head: Normocephalic  and atraumatic.  Mouth/Throat: Oropharynx is clear and moist. No oropharyngeal exudate.  Eyes: Pupils are equal, round, and reactive to light. Conjunctivae are normal.  Cardiovascular: Normal rate, regular rhythm and normal heart sounds.   Pulmonary/Chest: Effort normal and breath sounds normal.  Abdominal: Soft. Bowel sounds are normal. He exhibits no distension. There is no tenderness. There is no rebound and no guarding.    Musculoskeletal: He exhibits no edema.  Left sided thoracic rib scars noted laterally, well healed, slight anterior lateral mid rib tenderness with no bony defects or overlying changes  Neurological: He is alert. Gait normal.  Skin: Skin is warm and dry. He is not diaphoretic.  Psychiatric: Mood and affect normal.     Assessment/Plan:   Routine general medical examination at a health care facility Encourage diet and exercise. Cologuard will be done. He'll check with his insurance to make sure it's covered. Advised not to provide a sample if it is not covered. Prostate exam normal. PSA is ordered. He will check regarding the shingles vaccine with his insurance. Encouraged him to stay active and monitor his ribs. If they do not continue to improve he should let his surgeon know. Discussed that rib injuries or operations involving the ribs are very slow to heal given inability to rest them. Labs as outlined below.   Orders Placed This Encounter  Procedures  . Lipid panel    Standing Status:   Future    Standing Expiration Date:   04/16/2018  . PSA    Standing Status:   Future    Standing Expiration Date:   04/16/2018  . Comp Met (CMET)    Standing Status:   Future    Standing Expiration Date:   04/16/2018  . HgB A1c    Standing Status:   Future    Standing Expiration Date:   04/16/2018    No orders of the defined types were placed in this encounter.    Tommi Rumps, MD [place]

## 2017-04-16 NOTE — Patient Instructions (Signed)
Nice to see you. We'll have you return for fasting lab work. Please try to build up your exercise. Please monitor your left ribs and if they do not continue to improve please contact your surgeon

## 2017-04-22 ENCOUNTER — Other Ambulatory Visit (INDEPENDENT_AMBULATORY_CARE_PROVIDER_SITE_OTHER): Payer: BLUE CROSS/BLUE SHIELD

## 2017-04-22 DIAGNOSIS — E663 Overweight: Secondary | ICD-10-CM

## 2017-04-22 DIAGNOSIS — Z13220 Encounter for screening for lipoid disorders: Secondary | ICD-10-CM

## 2017-04-22 DIAGNOSIS — Z125 Encounter for screening for malignant neoplasm of prostate: Secondary | ICD-10-CM | POA: Diagnosis not present

## 2017-04-22 LAB — LIPID PANEL
CHOLESTEROL: 171 mg/dL (ref 0–200)
HDL: 38.4 mg/dL — ABNORMAL LOW (ref >39.00)
LDL CALC: 104 mg/dL — AB (ref 0–99)
NonHDL: 132.52
TRIGLYCERIDES: 141 mg/dL (ref 0.0–149.0)
Total CHOL/HDL Ratio: 4
VLDL: 28.2 mg/dL (ref 0.0–40.0)

## 2017-04-22 LAB — COMPREHENSIVE METABOLIC PANEL
ALT: 29 U/L (ref 0–53)
AST: 22 U/L (ref 0–37)
Albumin: 4.6 g/dL (ref 3.5–5.2)
Alkaline Phosphatase: 74 U/L (ref 39–117)
BILIRUBIN TOTAL: 0.5 mg/dL (ref 0.2–1.2)
BUN: 18 mg/dL (ref 6–23)
CALCIUM: 10.2 mg/dL (ref 8.4–10.5)
CHLORIDE: 103 meq/L (ref 96–112)
CO2: 29 meq/L (ref 19–32)
Creatinine, Ser: 1.14 mg/dL (ref 0.40–1.50)
GFR: 71.1 mL/min (ref >60.00)
GLUCOSE: 101 mg/dL — AB (ref 70–99)
Potassium: 4.3 mEq/L (ref 3.5–5.1)
Sodium: 142 mEq/L (ref 135–145)
Total Protein: 7.9 g/dL (ref 6.0–8.3)

## 2017-04-22 LAB — HEMOGLOBIN A1C: HEMOGLOBIN A1C: 5.5 % (ref 4.6–6.5)

## 2017-04-22 LAB — PSA: PSA: 5.4 ng/mL — ABNORMAL HIGH (ref 0.10–4.00)

## 2017-04-23 ENCOUNTER — Telehealth: Payer: Self-pay | Admitting: Family Medicine

## 2017-04-23 DIAGNOSIS — R972 Elevated prostate specific antigen [PSA]: Secondary | ICD-10-CM

## 2017-04-23 NOTE — Telephone Encounter (Signed)
Order placed

## 2017-04-23 NOTE — Telephone Encounter (Signed)
-----   Message from Inetta FermoJessica S Hendricks, [place] sent at 04/23/2017  3:15 PM EST ----- Left message to return call, [facility] and [facility] to patient

## 2017-04-23 NOTE — Telephone Encounter (Signed)
Please place referral to urology

## 2017-04-23 NOTE — Telephone Encounter (Signed)
Results given. Pt would like a referral to a urologist.

## 2017-04-26 ENCOUNTER — Encounter: Payer: Self-pay | Admitting: *Deleted

## 2017-04-29 ENCOUNTER — Other Ambulatory Visit: Payer: Self-pay | Admitting: Family Medicine

## 2017-04-29 DIAGNOSIS — I10 Essential (primary) hypertension: Secondary | ICD-10-CM

## 2017-05-08 ENCOUNTER — Encounter: Payer: Self-pay | Admitting: Urology

## 2017-05-08 ENCOUNTER — Ambulatory Visit (INDEPENDENT_AMBULATORY_CARE_PROVIDER_SITE_OTHER): Payer: BLUE CROSS/BLUE SHIELD | Admitting: Urology

## 2017-05-08 VITALS — BP 134/89 | HR 84 | Ht 68.0 in | Wt 189.2 lb

## 2017-05-08 DIAGNOSIS — N401 Enlarged prostate with lower urinary tract symptoms: Secondary | ICD-10-CM | POA: Diagnosis not present

## 2017-05-08 DIAGNOSIS — R972 Elevated prostate specific antigen [PSA]: Secondary | ICD-10-CM

## 2017-05-08 DIAGNOSIS — N138 Other obstructive and reflux uropathy: Secondary | ICD-10-CM | POA: Insufficient documentation

## 2017-05-08 DIAGNOSIS — R350 Frequency of micturition: Secondary | ICD-10-CM | POA: Diagnosis not present

## 2017-05-08 LAB — URINALYSIS, COMPLETE
Bilirubin, UA: NEGATIVE
Glucose, UA: NEGATIVE
KETONES UA: NEGATIVE
LEUKOCYTES UA: NEGATIVE
Nitrite, UA: NEGATIVE
PROTEIN UA: NEGATIVE
RBC, UA: NEGATIVE
Specific Gravity, UA: 1.02 (ref 1.005–1.030)
Urobilinogen, Ur: 0.2 mg/dL (ref 0.2–1.0)
pH, UA: 5.5 (ref 5.0–7.5)

## 2017-05-08 MED ORDER — TAMSULOSIN HCL 0.4 MG PO CAPS: 0.4000 mg | ORAL_CAPSULE | Freq: Every day | ORAL | 0 refills | Status: DC

## 2017-05-08 NOTE — Progress Notes (Signed)
05/08/2017 1:59 PM   Caleb Avila 05/24/1961 [id number]  Referring provider: Glori LuisSonnenberg, [name], MD [address] [place], [place] [address]  Chief Complaint  Patient presents with  . Elevated PSA    New Patient    HPI: Caleb Avila is a 56 year old male seen in consultation at the request of Dr. [name] for evaluation of an elevated PSA.  A PSA drawn on 04/22/2017 was 5.4.  Only prior PSA on record review was 3.08 drawn in June 2017.  He has some mild lower urinary tract symptoms consisting of occasional urinary frequency and rare hesitancy and urgency.  I PSS was 7/35 with a quality of life rated at 1/6.  He denies dysuria or gross hematuria.  He denies flank, abdominal, pelvic or scrotal pain.  His only previous urologic history is a vasectomy 20 years ago.  He denies family history of prostate cancer.   PMH: Past Medical History:  Diagnosis Date  . Depression   . Frequent headaches   . Hypertension     Surgical History: Past Surgical History:  Procedure Laterality Date  . chest wall cyst    . FINGER SURGERY Right   . VIDEO ASSISTED THORACOSCOPY (VATS)/EMPYEMA Left 01/13/2017   Procedure: VIDEO ASSISTED THORACOSCOPY (VATS) DRAIN EMPYEMA;  Surgeon: [name], Peter, MD;  [facility];  Service: Thoracic;  Laterality: Left;    Home Medications:  Allergies as of 05/08/2017   No Known Allergies     Medication List        Accurate as of 05/08/17  1:59 PM. Always use your most recent med list.          amLODipine 5 MG tablet Commonly known as:  NORVASC TAKE 1 TABLET BY MOUTH EVERY DAY   aspirin 325 MG EC tablet Take 1 tablet (325 mg total) by mouth daily.   multivitamin tablet Take 1 tablet by mouth daily.       Allergies: No Known Allergies  Family History: Family History  Problem Relation Age of Onset  . Depression Unknown     Social History:  reports that  has never smoked. he has never used smokeless tobacco. He reports  that he does not drink alcohol or use drugs.  ROS: UROLOGY Frequent Urination?: Yes Hard to postpone urination?: No Burning/pain with urination?: No Get up at night to urinate?: No Leakage of urine?: No Urine stream starts and stops?: No Trouble starting stream?: No Do you have to strain to urinate?: No Blood in urine?: No Urinary tract infection?: No Sexually transmitted disease?: No Injury to kidneys or bladder?: No Painful intercourse?: No Weak stream?: No Erection problems?: No Penile pain?: No  Gastrointestinal Nausea?: No Vomiting?: No Indigestion/heartburn?: No Diarrhea?: No Constipation?: No  Constitutional Fever: No Night sweats?: No Weight loss?: No Fatigue?: No  Skin Skin rash/lesions?: No Itching?: No  Eyes Blurred vision?: No Double vision?: No  Ears/Nose/Throat Sore throat?: No Sinus problems?: No  Hematologic/Lymphatic Swollen glands?: No Easy bruising?: No  Cardiovascular Leg swelling?: No Chest pain?: No  Respiratory Cough?: No Shortness of breath?: No  Endocrine Excessive thirst?: No  Musculoskeletal Back pain?: No Joint pain?: No  Neurological Headaches?: No Dizziness?: No  Psychologic Depression?: No Anxiety?: No  Physical Exam: BP 134/89 (BP Location: Right Arm, Patient Position: Sitting, Cuff Size: Normal)   Pulse 84   Ht 5\' 8"  (1.727 m)   Wt 189 lb 3.2 oz (85.8 kg)   BMI 28.77 kg/m   Constitutional:  Alert  and oriented, No acute distress. HEENT: [place] AT, moist mucus membranes.  Trachea midline, no masses. Cardiovascular: No clubbing, cyanosis, or edema. Respiratory: Normal respiratory effort, no increased work of breathing. GI: Abdomen is soft, nontender, nondistended, no abdominal masses GU: No CVA tenderness.[name].  Penis circumcised without lesions, testes descended bilaterally without masses or tenderness, cord/epididymes palpably normal. Prostate 35 g, smooth without nodules.  Skin: No rashes, bruises or  suspicious lesions. Lymph: No cervical or inguinal adenopathy. Neurologic: Grossly intact, no focal deficits, moving all 4 extremities. Psychiatric: Normal mood and affect.  Laboratory Data: Lab Results  Component Value Date   WBC 14.0 (H) 01/20/2017   HGB 12.3 (L) 01/20/2017   HCT 37.3 (L) 01/20/2017   MCV 87.1 01/20/2017   PLT 648 (H) 01/20/2017    Lab Results  Component Value Date   CREATININE 1.14 04/22/2017     Lab Results  Component Value Date   HGBA1C 5.5 04/22/2017    Urinalysis Dipstick/microscopic negative    Assessment & Plan:    1. Elevated PSA Although PSA is a prostate cancer screening test he was informed that cancer is not the most common cause of an elevated PSA. Other potential causes including BPH and inflammation were discussed. He was informed that the only way to adequately diagnose prostate cancer would be a transrectal ultrasound and biopsy of the prostate. The procedure was discussed including potential risks of bleeding and infection/sepsis. He was also informed that a negative biopsy does not conclusively rule out the possibility that prostate cancer may be present and that continued monitoring is required. The use of newer adjunctive blood tests including PHI and 4kScore were discussed. The use of multiparametric prostate MRI was also discussed however is not typically used for initial evaluation of an elevated PSA. Continued periodic surveillance was also discussed.  Will initially prescribe a 30-day course of tamsulosin and repeat his PSA in 1 month.  If his PSA remains persistently elevated discussed prostate biopsy, additional blood testing and prostate MRI.  - Urinalysis, Complete  2. BPH with lower urinary tract symptoms Mild voiding symptoms which are not bothersome.  Riki AltesScott C Stoioff, MD  [facility] [address], [address] [place], [place] [address] [phone]

## 2017-06-07 ENCOUNTER — Other Ambulatory Visit: Payer: Self-pay

## 2017-06-07 ENCOUNTER — Other Ambulatory Visit: Payer: BLUE CROSS/BLUE SHIELD

## 2017-06-07 DIAGNOSIS — R972 Elevated prostate specific antigen [PSA]: Secondary | ICD-10-CM | POA: Diagnosis not present

## 2017-06-07 MED ORDER — TAMSULOSIN HCL 0.4 MG PO CAPS: 0.4000 mg | ORAL_CAPSULE | Freq: Every day | ORAL | 3 refills | Status: DC

## 2017-06-08 LAB — PSA: Prostate Specific Ag, Serum: 3.8 ng/mL (ref 0.0–4.0)

## 2017-06-12 ENCOUNTER — Telehealth: Payer: Self-pay

## 2017-06-12 NOTE — Telephone Encounter (Signed)
-----   Message from Riki AltesScott C Stoioff, MD sent at 06/11/2017  8:35 AM EST ----- Repeat PSA is normal at 3.8.  Recommend he continue annual PSA with Dr. [name]

## 2017-06-12 NOTE — Telephone Encounter (Signed)
Patient notified on vmail 

## 2017-06-13 ENCOUNTER — Other Ambulatory Visit: Payer: Self-pay

## 2017-06-13 MED ORDER — TAMSULOSIN HCL 0.4 MG PO CAPS: 0.4000 mg | ORAL_CAPSULE | Freq: Every day | ORAL | 11 refills | Status: DC

## 2017-06-25 ENCOUNTER — Other Ambulatory Visit: Payer: Self-pay

## 2017-06-25 ENCOUNTER — Other Ambulatory Visit: Payer: Self-pay | Admitting: Family Medicine

## 2017-06-25 DIAGNOSIS — I10 Essential (primary) hypertension: Secondary | ICD-10-CM

## 2017-06-27 ENCOUNTER — Other Ambulatory Visit: Payer: Self-pay

## 2017-06-27 DIAGNOSIS — I10 Essential (primary) hypertension: Secondary | ICD-10-CM

## 2017-06-27 MED ORDER — AMLODIPINE BESYLATE 5 MG PO TABS: 5.0000 mg | ORAL_TABLET | Freq: Every day | ORAL | 1 refills | Status: DC

## 2017-07-31 ENCOUNTER — Other Ambulatory Visit: Payer: Self-pay

## 2017-07-31 MED ORDER — TAMSULOSIN HCL 0.4 MG PO CAPS: 0.4000 mg | ORAL_CAPSULE | Freq: Every day | ORAL | 11 refills | Status: DC

## 2017-08-05 ENCOUNTER — Telehealth: Payer: Self-pay | Admitting: Urology

## 2017-08-05 MED ORDER — TAMSULOSIN HCL 0.4 MG PO CAPS: 0.4000 mg | ORAL_CAPSULE | Freq: Every day | ORAL | 11 refills | Status: DC

## 2017-08-05 NOTE — Telephone Encounter (Signed)
Pt called office asking for a refill, states he has moved to [place] and has changed pharmacy.  Pt needs refill on Tamsulosin, Please refill at [facility] [phone]. Please call patient and advise.  Thanks.

## 2017-08-05 NOTE — Telephone Encounter (Signed)
Refills sent to [facility] in [place]  

## 2017-08-07 ENCOUNTER — Other Ambulatory Visit: Payer: Self-pay

## 2017-08-07 MED ORDER — TAMSULOSIN HCL 0.4 MG PO CAPS: 0.4000 mg | ORAL_CAPSULE | Freq: Every day | ORAL | 3 refills | Status: DC

## 2017-10-15 ENCOUNTER — Other Ambulatory Visit: Payer: Self-pay

## 2017-10-15 ENCOUNTER — Ambulatory Visit: Payer: BLUE CROSS/BLUE SHIELD | Admitting: Family Medicine

## 2017-10-15 ENCOUNTER — Encounter: Payer: Self-pay | Admitting: Family Medicine

## 2017-10-15 VITALS — BP 120/82 | HR 87 | Temp 98.4°F | Ht 68.0 in | Wt 196.4 lb

## 2017-10-15 DIAGNOSIS — E669 Obesity, unspecified: Secondary | ICD-10-CM | POA: Insufficient documentation

## 2017-10-15 DIAGNOSIS — Z1211 Encounter for screening for malignant neoplasm of colon: Secondary | ICD-10-CM

## 2017-10-15 DIAGNOSIS — R972 Elevated prostate specific antigen [PSA]: Secondary | ICD-10-CM

## 2017-10-15 DIAGNOSIS — E663 Overweight: Secondary | ICD-10-CM | POA: Diagnosis not present

## 2017-10-15 DIAGNOSIS — N401 Enlarged prostate with lower urinary tract symptoms: Secondary | ICD-10-CM | POA: Diagnosis not present

## 2017-10-15 DIAGNOSIS — I10 Essential (primary) hypertension: Secondary | ICD-10-CM | POA: Diagnosis not present

## 2017-10-15 DIAGNOSIS — R350 Frequency of micturition: Secondary | ICD-10-CM | POA: Diagnosis not present

## 2017-10-15 NOTE — Patient Instructions (Signed)
Nice to see you. Please try to get more greens in your diet.  Please try to decrease your chocolate intake.  Please try to get 30 minutes of exercise 5 days a week. We will get you referred for colonoscopy.

## 2017-10-15 NOTE — Assessment & Plan Note (Signed)
Asymptomatic on Flomax.  Plan for yearly PSA checks.

## 2017-10-15 NOTE — Progress Notes (Signed)
  Marikay Alar, MD Phone: [phone]  Caleb Avila is a 57 y.o. male who presents today for f/u.  HYPERTENSION  Disease Monitoring  Home BP Monitoring not checking Chest pain- no    Dyspnea- no Medications  Compliance-  Taking amlodipine. Edema- no  BPH/elevated PSA: He is saw urology and was placed on Flomax.  He notes no significant urinary symptoms now.  His PSA did come down.  He empties fully.  No starting and stopping.  No straining.  Overweight: He walks 20 to 25 minutes 3 times a week.  He wants to increase that.  He does not eat very healthfully.  Has rice crispies and a banana in the morning.  Eats canned pasta or soup and a peanut butter and jelly sandwich for lunch.  Has TV dinners with no vegetables at night.  Occasionally has vegetables on the weekend.  Does eat chocolate daily.    Social History   Tobacco Use  Smoking Status Never Smoker  Smokeless Tobacco Never Used     ROS see history of present illness  Objective  Physical Exam Vitals:   10/15/17 1548  BP: 120/82  Pulse: 87  Temp: 98.4 F (36.9 C)  SpO2: 95%    BP Readings from Last 3 Encounters:  10/15/17 120/82  05/08/17 134/89  04/16/17 126/82   Wt Readings from Last 3 Encounters:  10/15/17 196 lb 6.4 oz (89.1 kg)  05/08/17 189 lb 3.2 oz (85.8 kg)  04/16/17 187 lb 12.8 oz (85.2 kg)    Physical Exam  Constitutional: No distress.  Cardiovascular: Normal rate, regular rhythm and normal heart sounds.  Pulmonary/Chest: Effort normal and breath sounds normal.  Musculoskeletal: He exhibits no edema.  Neurological: He is alert.  Skin: Skin is warm and dry. He is not diaphoretic.     Assessment/Plan: Please see individual problem list.  Essential hypertension Well-controlled.  Continue current regimen.  Benign prostatic hyperplasia with urinary frequency Asymptomatic on Flomax.  Plan for yearly PSA checks.  Elevated PSA He saw urology.  They placed him on Flomax.  PSA  slightly elevated likely related to BPH.  PSA trended down.  They recommended yearly PSAs with me.  Overweight Encouraged increasing exercise.  Discussed increasing vegetable intake and decreasing chocolate intake.  Health maintenance: GI referral placed for colonoscopy.  Orders Placed This Encounter  Procedures  . Ambulatory referral to Gastroenterology    Referral Priority:   Routine    Referral Type:   Consultation    Referral Reason:   Specialty Services Required    Number of Visits Requested:   1    No orders of the defined types were placed in this encounter.    Marikay Alar, MD [facility]

## 2017-10-15 NOTE — Assessment & Plan Note (Signed)
Encouraged increasing exercise.  Discussed increasing vegetable intake and decreasing chocolate intake.

## 2017-10-15 NOTE — Assessment & Plan Note (Signed)
Well-controlled.  Continue current regimen. 

## 2017-10-15 NOTE — Assessment & Plan Note (Signed)
He saw urology.  They placed him on Flomax.  PSA slightly elevated likely related to BPH.  PSA trended down.  They recommended yearly PSAs with me.

## 2017-10-21 ENCOUNTER — Telehealth: Payer: Self-pay

## 2017-10-21 NOTE — Telephone Encounter (Signed)
Gastroenterology Pre-Procedure Review  Request Date: 10/28/17 Requesting Physician: Dr. Tobi Bastos   PATIENT REVIEW QUESTIONS: The patient responded to the following health history questions as indicated:    1. Are you having any GI issues? No   2. Do you have a personal history of Polyps? No    3. Do you have a family history of Colon Cancer or Polyps? No  4. Diabetes Mellitus? No  5. Joint replacements in the past 12 months? No  6. Major health problems in the past 3 months? No  7. Any artificial heart valves, MVP, or defibrillator? No     MEDICATIONS & ALLERGIES:    Patient reports the following regarding taking any anticoagulation/antiplatelet therapy:   Plavix, Coumadin, Eliquis, Xarelto, Lovenox, Pradaxa, Brilinta, or Effient? No  Aspirin? Yes   Patient confirms/reports the following medications:  Current Outpatient Medications  Medication Sig Dispense Refill  . amLODipine (NORVASC) 5 MG tablet Take 1 tablet (5 mg total) by mouth daily. 90 tablet 1  . aspirin EC 325 MG EC tablet Take 1 tablet (325 mg total) by mouth daily. 30 tablet 0  . Multiple Vitamin (MULTIVITAMIN) tablet Take 1 tablet by mouth daily.    . tamsulosin (FLOMAX) 0.4 MG CAPS capsule Take 1 capsule (0.4 mg total) by mouth daily. 90 capsule 3   No current facility-administered medications for this visit.     Patient confirms/reports the following allergies:  No Known Allergies  No orders of the defined types were placed in this encounter.   AUTHORIZATION INFORMATION Primary Insurance: 1D#: Group #:  Secondary Insurance: 1D#: Group #:  SCHEDULE INFORMATION: Date: 10/28/17 Time: Location: [facility]

## 2017-10-23 ENCOUNTER — Other Ambulatory Visit: Payer: Self-pay

## 2017-10-23 DIAGNOSIS — Z1211 Encounter for screening for malignant neoplasm of colon: Secondary | ICD-10-CM

## 2017-10-23 MED ORDER — NA SULFATE-K SULFATE-MG SULF 17.5-3.13-1.6 GM/177ML PO SOLN: 1.0000 | Freq: Once | ORAL | 0 refills | Status: AC

## 2017-10-24 ENCOUNTER — Telehealth: Payer: Self-pay

## 2017-10-24 NOTE — Telephone Encounter (Signed)
Patient contacted Trish in endo to cancel his colonoscopy on the 13th due to family emergency.  This has been canceled. Patient will call back to reschedule.  Thanks [employer]

## 2017-10-28 ENCOUNTER — Encounter: Admission: RE | Payer: Self-pay | Source: Ambulatory Visit

## 2017-10-28 ENCOUNTER — Ambulatory Visit
Admission: RE | Admit: 2017-10-28 | Payer: BLUE CROSS/BLUE SHIELD | Source: Ambulatory Visit | Admitting: Gastroenterology

## 2017-10-28 SURGERY — COLONOSCOPY WITH PROPOFOL
Anesthesia: General

## 2017-11-20 ENCOUNTER — Telehealth: Payer: Self-pay | Admitting: Family Medicine

## 2017-11-20 NOTE — Telephone Encounter (Signed)
He does not meet criteria for a pneumonia vaccine. Currently this is approved for people 65 years or older and those with risk factors, which he does not have.

## 2017-11-20 NOTE — Telephone Encounter (Signed)
Please advise 

## 2017-11-20 NOTE — Telephone Encounter (Signed)
Patient notified

## 2017-11-20 NOTE — Telephone Encounter (Signed)
Copied from CRM [phone]#[id number]. Topic: Quick Communication - See Telephone Encounter >> Nov 20, 2017  8:57 AM Louie BunPalacios Medina, Rosey Batheresa D wrote: CRM for notification. See Telephone encounter for: 11/20/17. Patient called and would like to talk to a CMA about a vaccine to see if he needs it or not. The vaccine is the pneumonia. Please call  patient back, thanks.

## 2017-11-26 ENCOUNTER — Ambulatory Visit: Payer: Self-pay

## 2017-11-26 NOTE — Telephone Encounter (Signed)
Phone call to pt.  Reported he noticed last night that there was swelling beneath the foreskin.  Stated that there is a "light yellow discharge."  Denied any pain, with exception of when he squeezed the penis, to check for discharge, "otherwise the penis does not hurt."  Denied any difficulty or painful urination.  Denied fever or chills.  Stated he noticed a little discharge in his underwear.  Requesting to get an appt. As soon as possible.     Given appt. For 11/28/17, as no available appt. On 6/12.  Care advice given per protocol.  Advised to call back if symptoms worsen.  Verb. Understanding.  Agreed with plan.      Reason for Disposition . [1] Not circumcised AND [2] swollen foreskin  Answer Assessment - Initial Assessment Questions 1. SYMPTOM: "What's the main symptom you're concerned about?" (e.g., discharge from penis, rash, pain, itching, swelling)     Some swelling beneath foreskin 2. LOCATION: "Where is the _______ located?"     Beneath foreskin  3. ONSET: "When did ________  start?"    Monday, 6/10 4. PAIN: "Is there any pain?" If so, ask: "How bad is it?"  (Scale 1-10; or mild, moderate, severe)     Only pain when tried to express any drainage; doesn't hurt otherwise 5. URINE: "Any difficulty passing urine?" If so, ask: "When was the last time?"     Normal passage of urine  6. CAUSE: "What do you think is causing the symptoms?"     Unknown 7. OTHER SYMPTOMS: "Do you have any other symptoms?" (e.g., fever, abdominal pain, blood in urine)     Only c/o discomfort when squeezing tip of penis; denies any urinary symptoms; light yellow discharge ; denied fever or chills  Protocols used: PENIS AND SCROTUM [facility]

## 2017-11-27 NOTE — Progress Notes (Addendum)
Subjective:    Patient ID: Caleb Avila, male    DOB: 07/24/1960, 57 y.o.   MRN: [id number]  HPI  Mr. Caleb Avila is a 57 year old male who presents today with irritation and dsicharge from his penis that started 4 days ago. Associated symptoms: Erythema: No Discharge: Gray/white/yellow Pain: No Pruritis: No Fever/Chills/Sweats: No Malaise:No  Rash: No Painful joints: No  Mild dysuria is present   He takes tamsulosin for benign prostatic hyperplasia with urinary frequency. He denies increase in urinary frequency or blood in urine.   History of DM: No Trauma: No  Hygiene practices include washing with soap and water New sexual partners: Yes; one partner; reports wearing condoms.   Partners:  New partners: yes Multiple partners: No  Practices: Vaginal intercourse   Practices: Condom Use: Yes "most of the time"   Past History: History of STIs: No per patient  He states that he would like to be checked today for STIs.   Review of Systems  Constitutional: Negative for chills, fatigue and fever.  Respiratory: Negative for cough, shortness of breath and wheezing.   Cardiovascular: Negative for chest pain and palpitations.  Gastrointestinal: Negative for abdominal pain.  Genitourinary: Positive for discharge and dysuria. Negative for hematuria, penile pain, penile swelling, scrotal swelling, testicular pain and urgency.  Musculoskeletal: Negative for myalgias.  Skin: Negative for rash.   Past Medical History:  Diagnosis Date  . Depression   . Empyema lung (HCC) 01/13/2017  . Frequent headaches   . Hypertension   . Multifocal pneumonia 01/07/2017     Social History   Socioeconomic History  . Marital status: Single    Spouse name: Not on file  . Number of children: Not on file  . Years of education: Not on file  . Highest education level: Not on file  Occupational History  . Not on file  Social Needs  . Financial resource strain: Not on file  . Food  insecurity:    Worry: Not on file    Inability: Not on file  . Transportation needs:    Medical: Not on file    Non-medical: Not on file  Tobacco Use  . Smoking status: Never Smoker  . Smokeless tobacco: Never Used  Substance and Sexual Activity  . Alcohol use: No    Alcohol/week: 0.0 oz  . Drug use: No  . Sexual activity: Not on file  Lifestyle  . Physical activity:    Days per week: Not on file    Minutes per session: Not on file  . Stress: Not on file  Relationships  . Social connections:    Talks on phone: Not on file    Gets together: Not on file    Attends religious service: Not on file    Active member of club or organization: Not on file    Attends meetings of clubs or organizations: Not on file    Relationship status: Not on file  . Intimate partner violence:    Fear of current or ex partner: Not on file    Emotionally abused: Not on file    Physically abused: Not on file    Forced sexual activity: Not on file  Other Topics Concern  . Not on file  Social History Narrative  . Not on file    Past Surgical History:  Procedure Laterality Date  . chest wall cyst    . FINGER SURGERY Right   . VIDEO ASSISTED THORACOSCOPY (VATS)/EMPYEMA Left 01/13/2017  Procedure: VIDEO ASSISTED THORACOSCOPY (VATS) DRAIN EMPYEMA;  Surgeon: Donata Clay, Theron Arista, MD;  Location: [facility];  Service: Thoracic;  Laterality: Left;    Family History  Problem Relation Age of Onset  . Depression Unknown     No Known Allergies  Current Outpatient Medications on File Prior to Visit  Medication Sig Dispense Refill  . amLODipine (NORVASC) 5 MG tablet Take 1 tablet (5 mg total) by mouth daily. 90 tablet 1  . aspirin EC 325 MG EC tablet Take 1 tablet (325 mg total) by mouth daily. 30 tablet 0  . Multiple Vitamin (MULTIVITAMIN) tablet Take 1 tablet by mouth daily.    . tamsulosin (FLOMAX) 0.4 MG CAPS capsule Take 1 capsule (0.4 mg total) by mouth daily. 90 capsule 3   No current  facility-administered medications on file prior to visit.     BP (!) 132/94 (BP Location: Left Arm, Patient Position: Sitting, Cuff Size: Normal)   Pulse 91   Temp 97.9 F (36.6 C) (Oral)   Resp 16   Wt 192 lb (87.1 kg)   SpO2 98%   BMI 29.19 kg/m        Objective:   Physical Exam  Constitutional: He is oriented to person, place, and time. He appears well-developed and well-nourished.  Eyes: Pupils are equal, round, and reactive to light. No scleral icterus.  Neck: Neck supple.  Cardiovascular: Normal rate, normal heart sounds and intact distal pulses.  Pulmonary/Chest: Effort normal and breath sounds normal. He has no wheezes. He has no rales.  Abdominal: Soft. Bowel sounds are normal. There is no tenderness.  Genitourinary:  Genitourinary Comments: Penis circumcised; no swelling or lesions present. White/yellow discharge present; obtained specimen for evaluation. No pain with palpation. Mild irritation of meatus present.  Lymphadenopathy:    He has no cervical adenopathy.  Neurological: He is alert and oriented to person, place, and time.  Skin: Skin is warm and dry. No rash noted.  Psychiatric: He has a normal mood and affect. His behavior is normal. Judgment and thought content normal.       Assessment & Plan:  1. Urethritis Discharge and mild dysuria present; UA +Leukocytes; will obtain culture. Recent new sexual partner and patient requested STI testing. Symptoms are concerning for gonorrhea/chlamydia infection vs cystitis or prostatitis. Obtain sample of discharge for further evaluation. We discussed empiric treatment and patient would like to obtain treatment today. Advised avoidance of sexual activity for at least 7 days and until symptoms resolve. Further discussed the importance of testing and partner notification if needed. - cefTRIAXone (ROCEPHIN) 250 MG injection; Inject 250 mg into the muscle once for 1 dose.  FOR IM use in LARGE MUSCLE MASS  Dispense: 1 each;  Refill: 0 - azithromycin (ZITHROMAX) 500 MG tablet; Take 1 tablet (500 mg total) by mouth daily.  Dispense: 2 tablet; Refill: 0  2. Routine screening for STI (sexually transmitted infection) No prior history of STIs per patient. He is requesting testing today. Reviewed testing for STIs and also discussed safe sex practices. Close return precautions advised.   - Hepatitis B surface antibody; Future - HIV antibody (with reflex) - RPR  Chaperone present  Roddie Mc, FNP-C

## 2017-11-28 ENCOUNTER — Encounter: Payer: Self-pay | Admitting: Family Medicine

## 2017-11-28 ENCOUNTER — Ambulatory Visit: Payer: BLUE CROSS/BLUE SHIELD | Admitting: Family Medicine

## 2017-11-28 ENCOUNTER — Telehealth: Payer: Self-pay | Admitting: Family Medicine

## 2017-11-28 ENCOUNTER — Other Ambulatory Visit: Payer: Self-pay | Admitting: Family Medicine

## 2017-11-28 ENCOUNTER — Other Ambulatory Visit (HOSPITAL_COMMUNITY)
Admission: RE | Admit: 2017-11-28 | Discharge: 2017-11-28 | Disposition: A | Discharge status: Home / Self Care | Payer: BLUE CROSS/BLUE SHIELD | Source: Ambulatory Visit | Attending: Family Medicine | Admitting: Family Medicine

## 2017-11-28 VITALS — BP 132/94 | HR 91 | Temp 97.9°F | Resp 16 | Wt 192.0 lb

## 2017-11-28 DIAGNOSIS — A549 Gonococcal infection, unspecified: Secondary | ICD-10-CM | POA: Diagnosis not present

## 2017-11-28 DIAGNOSIS — Z113 Encounter for screening for infections with a predominantly sexual mode of transmission: Secondary | ICD-10-CM

## 2017-11-28 DIAGNOSIS — N342 Other urethritis: Secondary | ICD-10-CM

## 2017-11-28 LAB — URINALYSIS, MICROSCOPIC ONLY: RBC / HPF: NONE SEEN (ref 0–?)

## 2017-11-28 LAB — POC URINALSYSI DIPSTICK (AUTOMATED)
Bilirubin, UA: NEGATIVE
Blood, UA: 10
Glucose, UA: NEGATIVE
Ketones, UA: NEGATIVE
NITRITE UA: NEGATIVE
PROTEIN UA: POSITIVE — AB
Spec Grav, UA: >=1.03 — AB (ref 1.010–1.025)
UROBILINOGEN UA: 0.2 U/dL
pH, UA: 5 (ref 5.0–8.0)

## 2017-11-28 MED ORDER — AZITHROMYCIN 500 MG PO TABS: 500.0000 mg | ORAL_TABLET | Freq: Every day | ORAL | 0 refills | Status: DC

## 2017-11-28 MED ORDER — CEFTRIAXONE SODIUM 250 MG IJ SOLR: 250.0000 mg | Freq: Once | INTRAMUSCULAR | 0 refills | Status: DC

## 2017-11-28 MED ORDER — CEFTRIAXONE SODIUM 1 G IJ SOLR: 1.0000 g | Freq: Once | INTRAMUSCULAR | Status: DC

## 2017-11-28 MED ORDER — CEFTRIAXONE SODIUM 500 MG IJ SOLR
500.0000 mg | Freq: Once | INTRAMUSCULAR | Status: AC
  Administered 2017-11-28: 250 mg via INTRAMUSCULAR

## 2017-11-28 NOTE — Telephone Encounter (Signed)
Verified order for [facility].

## 2017-11-28 NOTE — Telephone Encounter (Signed)
Copied from CRM [id number]. Topic: Quick Communication - See Telephone Encounter >> Nov 28, 2017 10:02 AM Trula SladeWalter, Linda F wrote: CRM for notification. See Telephone encounter for: 11/28/17. [phone] stated that the patient's azithromycin (ZITHROMAX) 500 MG tablet prescription should read two tablets a day as one dose. Please resend or call and give a verbal okay.

## 2017-11-28 NOTE — Patient Instructions (Signed)
Please go to the lab before you leave.  We will contact you with the results of your lab work.  If symptoms do not improve with treatment, worsen, or new symptoms develop, please follow up for further evaluation and treatment.    Urethritis, Adult Urethritis is an inflammation of the tube through which urine exits your bladder (urethra). What are the causes? Urethritis is often caused by an infection in your urethra. The infection can be viral, like herpes. The infection can also be bacterial, like gonorrhea. What increases the risk? Risk factors of urethritis include:  Having sex without using a condom.  Having multiple sexual partners.  Having poor hygiene.  What are the signs or symptoms? Symptoms of urethritis are less noticeable in women than in men. These symptoms include:  Burning feeling when you urinate (dysuria).  Discharge from your urethra.  Blood in your urine (hematuria).  Urinating more than usual.  How is this diagnosed? To confirm a diagnosis of urethritis, your health care provider will do the following:  Ask about your sexual history.  Perform a physical exam.  Have you provide a sample of your urine for lab testing.  Use a cotton swab to gently collect a sample from your urethra for lab testing.  How is this treated? It is important to treat urethritis. Depending on the cause, untreated urethritis may lead to serious genital infections and possibly infertility. Urethritis caused by a bacterial infection is treated with antibiotic medicine. All sexual partners must be treated. Follow these instructions at home:  Do not have sex until the test results are known and treatment is completed, even if your symptoms go away before you finish treatment.  If you were prescribed an antibiotic, finish it all even if you start to feel better. Contact a health care provider if:  Your symptoms are not improved in 3 days.  Your symptoms are getting  worse.  You develop abdominal pain or pelvic pain (in women).  You develop joint pain.  You have a fever. Get help right away if:  You have severe pain in the belly, back, or side.  You have repeated vomiting. This information is not intended to replace advice given to you by your health care provider. Make sure you discuss any questions you have with your health care provider. Document Released: 11/28/2000 Document Revised: 11/10/2015 Document Reviewed: 02/02/2013 Elsevier Interactive Patient Education  2017 [employer]

## 2017-11-29 ENCOUNTER — Telehealth: Payer: Self-pay | Admitting: Family Medicine

## 2017-11-29 LAB — URINE CULTURE
MICRO NUMBER: 90710424
Result:: NO GROWTH
SPECIMEN QUALITY: ADEQUATE

## 2017-11-29 LAB — RPR: RPR: NONREACTIVE

## 2017-11-29 LAB — GC/CHLAMYDIA PROBE AMP (~~LOC~~) NOT AT ARMC
Chlamydia: NEGATIVE
Neisseria Gonorrhea: POSITIVE — AB

## 2017-11-29 LAB — HIV ANTIBODY (ROUTINE TESTING W REFLEX): HIV: NONREACTIVE

## 2017-11-29 LAB — HEPATITIS B SURFACE ANTIBODY,QUALITATIVE: HEP B S AB: NONREACTIVE

## 2017-11-29 NOTE — Telephone Encounter (Signed)
Patient saw Roddie McJulia Kordsmeier NP

## 2017-11-29 NOTE — Telephone Encounter (Signed)
Copied from CRM [id number]. Topic: Quick Communication - See Telephone Encounter >> Nov 29, 2017  4:11 PM Lorrine KinMcGee, Nilton Lave B, NT wrote: CRM for notification. See Telephone encounter for: 11/29/17. Patient calling in regards to lab results. Informed him that they were not finalized and that he would receive a call once the doctor had a chance to look at them. Please advise. CB#: [phone]

## 2017-11-29 NOTE — Telephone Encounter (Signed)
Please advise 

## 2017-11-30 NOTE — Telephone Encounter (Signed)
Lab results reviewed today; please call patient to review results and recommendations.

## 2017-12-02 NOTE — Telephone Encounter (Signed)
Left voice mail to call back ok for [facility] to speak to patient 

## 2017-12-02 NOTE — Telephone Encounter (Signed)
Patient made aware of results by [facility]

## 2017-12-21 ENCOUNTER — Other Ambulatory Visit: Payer: Self-pay | Admitting: Family Medicine

## 2017-12-21 DIAGNOSIS — I10 Essential (primary) hypertension: Secondary | ICD-10-CM

## 2018-03-19 ENCOUNTER — Telehealth: Payer: Self-pay

## 2018-03-19 NOTE — Telephone Encounter (Signed)
Sent to PCP to advise if we could fit patient in sooner?  Thanks

## 2018-03-19 NOTE — Telephone Encounter (Signed)
Copied from CRM [id number]. Topic: Appointment Scheduling - Scheduling Inquiry for Clinic >> Mar 19, 2018  1:29 PM Arlyss Gandy, NT wrote: Reason for CRM: Patient received call to r/s appt on 11-6 due to Dr. Birdie Sons being out of the office. Dr. Purvis Sheffield first available is in January. Can pt be worked in sooner? Pt prefers early morning appt. Please advise pt.

## 2018-03-19 NOTE — Telephone Encounter (Signed)
How about 830 on October 11?  The appointment was blocked for the patient.  If he does not want this appointment please open it up and we will have to find another appointment.  He could always be seen on November 19 as well.  They opened schedule for rescheduled patients.

## 2018-03-20 NOTE — Telephone Encounter (Signed)
Called pt and left a VM to call back. CRM created and sent to [facility] pool. 

## 2018-03-24 NOTE — Telephone Encounter (Signed)
Pt states he already confirmed the Friday appt and has been scheduled.  Nothing further needed unless there is a new issue. Pt will see dr ion Friday 10/11 be there at 8:15 arrive

## 2018-03-24 NOTE — Telephone Encounter (Signed)
Called pt and left a VM to call back. CRM created and sent to [facility] pool. 

## 2018-03-24 NOTE — Telephone Encounter (Signed)
Pt has been scheduled by [facility]

## 2018-03-24 NOTE — Telephone Encounter (Signed)
Pt returned [name]'s call.  Please call pt: [phone]

## 2018-03-24 NOTE — Telephone Encounter (Signed)
Patient returned call

## 2018-03-24 NOTE — Telephone Encounter (Signed)
Pt has been scheduled by [facility]  

## 2018-03-28 ENCOUNTER — Other Ambulatory Visit: Payer: Self-pay

## 2018-03-28 ENCOUNTER — Ambulatory Visit: Payer: BLUE CROSS/BLUE SHIELD | Admitting: Family Medicine

## 2018-03-28 ENCOUNTER — Other Ambulatory Visit (HOSPITAL_COMMUNITY)
Admission: RE | Admit: 2018-03-28 | Discharge: 2018-03-28 | Disposition: A | Discharge status: Home / Self Care | Payer: BLUE CROSS/BLUE SHIELD | Source: Ambulatory Visit | Attending: Family Medicine | Admitting: Family Medicine

## 2018-03-28 ENCOUNTER — Encounter: Payer: Self-pay | Admitting: Family Medicine

## 2018-03-28 VITALS — BP 118/86 | HR 77 | Temp 97.8°F | Wt 193.0 lb

## 2018-03-28 DIAGNOSIS — R972 Elevated prostate specific antigen [PSA]: Secondary | ICD-10-CM

## 2018-03-28 DIAGNOSIS — Z1211 Encounter for screening for malignant neoplasm of colon: Secondary | ICD-10-CM

## 2018-03-28 DIAGNOSIS — Z130 Encounter for screening for diseases of the blood and blood-forming organs and certain disorders involving the immune mechanism: Secondary | ICD-10-CM | POA: Diagnosis not present

## 2018-03-28 DIAGNOSIS — E663 Overweight: Secondary | ICD-10-CM

## 2018-03-28 DIAGNOSIS — K5900 Constipation, unspecified: Secondary | ICD-10-CM

## 2018-03-28 DIAGNOSIS — Z1329 Encounter for screening for other suspected endocrine disorder: Secondary | ICD-10-CM | POA: Diagnosis not present

## 2018-03-28 DIAGNOSIS — N401 Enlarged prostate with lower urinary tract symptoms: Secondary | ICD-10-CM

## 2018-03-28 DIAGNOSIS — R809 Proteinuria, unspecified: Secondary | ICD-10-CM

## 2018-03-28 DIAGNOSIS — Z13220 Encounter for screening for lipoid disorders: Secondary | ICD-10-CM | POA: Diagnosis not present

## 2018-03-28 DIAGNOSIS — Z125 Encounter for screening for malignant neoplasm of prostate: Secondary | ICD-10-CM | POA: Diagnosis not present

## 2018-03-28 DIAGNOSIS — Z0001 Encounter for general adult medical examination with abnormal findings: Secondary | ICD-10-CM | POA: Diagnosis not present

## 2018-03-28 DIAGNOSIS — R350 Frequency of micturition: Secondary | ICD-10-CM

## 2018-03-28 DIAGNOSIS — Z8619 Personal history of other infectious and parasitic diseases: Secondary | ICD-10-CM | POA: Diagnosis not present

## 2018-03-28 LAB — COMPREHENSIVE METABOLIC PANEL
ALBUMIN: 4.8 g/dL (ref 3.5–5.2)
ALK PHOS: 68 U/L (ref 39–117)
ALT: 41 U/L (ref 0–53)
AST: 27 U/L (ref 0–37)
BILIRUBIN TOTAL: 0.7 mg/dL (ref 0.2–1.2)
BUN: 17 mg/dL (ref 6–23)
CALCIUM: 10 mg/dL (ref 8.4–10.5)
CO2: 30 mEq/L (ref 19–32)
Chloride: 104 mEq/L (ref 96–112)
Creatinine, Ser: 1.17 mg/dL (ref 0.40–1.50)
GFR: 68.77 mL/min (ref >60.00)
Glucose, Bld: 100 mg/dL — ABNORMAL HIGH (ref 70–99)
Potassium: 4.5 mEq/L (ref 3.5–5.1)
Sodium: 142 mEq/L (ref 135–145)
TOTAL PROTEIN: 8.2 g/dL (ref 6.0–8.3)

## 2018-03-28 LAB — POCT URINALYSIS DIPSTICK
Bilirubin, UA: NEGATIVE
Glucose, UA: NEGATIVE
KETONES UA: NEGATIVE
LEUKOCYTES UA: NEGATIVE
NITRITE UA: NEGATIVE
PH UA: 6 (ref 5.0–8.0)
PROTEIN UA: NEGATIVE
RBC UA: NEGATIVE
SPEC GRAV UA: 1.025 (ref 1.010–1.025)
UROBILINOGEN UA: 0.2 U/dL

## 2018-03-28 LAB — TSH: TSH: 1.26 u[IU]/mL (ref 0.35–4.50)

## 2018-03-28 LAB — LIPID PANEL
CHOL/HDL RATIO: 5
Cholesterol: 170 mg/dL (ref 0–200)
HDL: 34.4 mg/dL — ABNORMAL LOW (ref >39.00)
LDL CALC: 105 mg/dL — AB (ref 0–99)
NonHDL: 135.63
TRIGLYCERIDES: 154 mg/dL — AB (ref 0.0–149.0)
VLDL: 30.8 mg/dL (ref 0.0–40.0)

## 2018-03-28 LAB — CBC
HCT: 51 % (ref 39.0–52.0)
Hemoglobin: 17.4 g/dL — ABNORMAL HIGH (ref 13.0–17.0)
MCHC: 34.1 g/dL (ref 30.0–36.0)
MCV: 88.3 fl (ref 78.0–100.0)
Platelets: 205 10*3/uL (ref 150.0–400.0)
RBC: 5.78 Mil/uL (ref 4.22–5.81)
RDW: 13.8 % (ref 11.5–15.5)
WBC: 6.2 10*3/uL (ref 4.0–10.5)

## 2018-03-28 LAB — HEMOGLOBIN A1C: HEMOGLOBIN A1C: 5.4 % (ref 4.6–6.5)

## 2018-03-28 LAB — PSA: PSA: 7.99 ng/mL — ABNORMAL HIGH (ref 0.10–4.00)

## 2018-03-28 MED ORDER — POLYETHYLENE GLYCOL 3350 17 GM/SCOOP PO POWD: 17.0000 g | Freq: Every day | ORAL | 0 refills | Status: DC | PRN

## 2018-03-28 NOTE — Progress Notes (Signed)
Tommi Rumps, MD Phone: [phone]  Caleb Avila is a 57 y.o. male who presents today for CPE.  Exercises by walking for at least an hour in the afternoons. Diet consists of fruits and vegetables and pretty much everything.  He has cut down on white bread.  One soda per day. Due for prostate cancer screening. Due for colonoscopy. He received his flu shot last week at work.  He also received a tetanus shot at the hospital last week after he cut his leg. He reports possibly having had shingles in the past.  Discussed Shingrix and he will check with insurance regarding this. Up-to-date on hepatitis C and HIV screening. No tobacco use or illicit drug use.  Occasional alcohol use. Sees his dentist when yearly.  Ophthalmologist once yearly. Notes nocturia x1 per night.  Urinary frequency 3-4 times per day.  This is more than usual.  No other urinary symptoms. He notes constipation for some time now.  He will have a bowel movement every couple of days.  He will feel fine until he has to have a bowel movement and then had some discomfort.  Passes hard balls of stool. Previously treated for gonorrhea.  He notes no recurrent symptoms.  Active Ambulatory Problems    Diagnosis Date Noted  . Encounter for general adult medical examination with abnormal findings 11/04/2015  . Essential hypertension 11/04/2015  . Constipation 11/04/2015  . Heat intolerance 11/04/2015  . Cutaneous skin tags 12/07/2015  . Elevated PSA 05/08/2017  . Benign prostatic hyperplasia with urinary frequency 05/08/2017  . Overweight 10/15/2017  . History of sexually transmitted disease 03/28/2018  . Proteinuria 03/28/2018   Resolved Ambulatory Problems    Diagnosis Date Noted  . CAP (community acquired pneumonia) 01/07/2017  . Sepsis ([place]) 01/07/2017  . Multifocal pneumonia 01/07/2017  . Acute respiratory failure with hypoxia ([place]) 01/07/2017  . SOB (shortness of breath)   . Pleural effusion   . Empyema  ([place])   . Hypoxemia   . Empyema lung ([place]) 01/13/2017   Past Medical History:  Diagnosis Date  . Depression   . Frequent headaches   . Hypertension     Family History  Problem Relation Age of Onset  . Depression Unknown     Social History   Socioeconomic History  . Marital status: Single    Spouse name: Not on file  . Number of children: Not on file  . Years of education: Not on file  . Highest education level: Not on file  Occupational History  . Not on file  Social Needs  . Financial resource strain: Not on file  . Food insecurity:    Worry: Not on file    Inability: Not on file  . Transportation needs:    Medical: Not on file    Non-medical: Not on file  Tobacco Use  . Smoking status: Never Smoker  . Smokeless tobacco: Never Used  Substance and Sexual Activity  . Alcohol use: No    Alcohol/week: 0.0 standard drinks  . Drug use: No  . Sexual activity: Not on file  Lifestyle  . Physical activity:    Days per week: Not on file    Minutes per session: Not on file  . Stress: Not on file  Relationships  . Social connections:    Talks on phone: Not on file    Gets together: Not on file    Attends religious service: Not on file    Active member of club or organization:  Not on file    Attends meetings of clubs or organizations: Not on file    Relationship status: Not on file  . Intimate partner violence:    Fear of current or ex partner: Not on file    Emotionally abused: Not on file    Physically abused: Not on file    Forced sexual activity: Not on file  Other Topics Concern  . Not on file  Social History Narrative  . Not on file    ROS  General:  Negative for nexplained weight loss, fever Skin: Negative for new or changing mole, sore that won't heal HEENT: Negative for trouble hearing, trouble seeing, ringing in ears, mouth sores, hoarseness, change in voice, dysphagia. CV:  Negative for chest pain, dyspnea, edema, palpitations Resp: Negative for  cough, dyspnea, hemoptysis GI: Positive for constipation, negative for nausea, vomiting, diarrhea, abdominal pain, melena, hematochezia. GU: Positive for urinary frequency, negative for dysuria, incontinence, urinary hesitance, hematuria, vaginal or penile discharge, sexual difficulty, lumps in testicle or breasts MSK: Negative for muscle cramps or aches, joint pain or swelling Neuro: Negative for headaches, weakness, numbness, dizziness, passing out/fainting Psych: Negative for depression, anxiety, memory problems  Objective  Physical Exam Vitals:   03/28/18 0815  BP: 118/86  Pulse: 77  Temp: 97.8 F (36.6 C)  SpO2: 96%    BP Readings from Last 3 Encounters:  03/28/18 118/86  11/28/17 (!) 132/94  10/15/17 120/82   Wt Readings from Last 3 Encounters:  03/28/18 193 lb (87.5 kg)  11/28/17 192 lb (87.1 kg)  10/15/17 196 lb 6.4 oz (89.1 kg)    Physical Exam  Constitutional: No distress.  HENT:  Head: Normocephalic and atraumatic.  Mouth/Throat: Oropharynx is clear and moist.  Eyes: Pupils are equal, round, and reactive to light. Conjunctivae are normal.  Neck: Neck supple.  Cardiovascular: Normal rate, regular rhythm and normal heart sounds.  Pulmonary/Chest: Effort normal and breath sounds normal.  Abdominal: Soft. Bowel sounds are normal. He exhibits no distension. There is no tenderness. There is no rebound and no guarding.  Musculoskeletal: He exhibits no edema.  Lymphadenopathy:    He has no cervical adenopathy.  Neurological: He is alert.  Skin: Skin is warm and dry. He is not diaphoretic.  Psychiatric: He has a normal mood and affect.     Assessment/Plan:   Encounter for general adult medical examination with abnormal findings Physical exam completed.  He will continue diet and exercise.  He will check with his insurance regarding Shingrix.  Lab work as outlined below.  Elevated PSA PSA previously elevated though trended down.  Check PSA today.  Benign  prostatic hyperplasia with urinary frequency Urinary frequency possibly related to this.  He will continue Flomax.  Proteinuria Noted on prior urinalysis.  Recheck urine today.  History of sexually transmitted disease Recheck gonorrhea and chlamydia.  Check HIV test.  Constipation Trial of MiraLAX.   Orders Placed This Encounter  Procedures  . PSA  . Comp Met (CMET)  . Lipid panel  . HgB A1c  . TSH  . CBC  . HIV antibody (with reflex)  . Ambulatory referral to Gastroenterology    Referral Priority:   Routine    Referral Type:   Consultation    Referral Reason:   Specialty Services Required    Number of Visits Requested:   1  . POCT Urinalysis Dipstick    Meds ordered this encounter  Medications  . polyethylene glycol powder (GLYCOLAX/MIRALAX) powder  Sig: Take 17 g by mouth daily as needed for mild constipation.    Dispense:  500 g    Refill:  0     Tommi Rumps, MD [place]

## 2018-03-28 NOTE — Assessment & Plan Note (Signed)
PSA previously elevated though trended down.  Check PSA today.

## 2018-03-28 NOTE — Assessment & Plan Note (Signed)
Recheck gonorrhea and chlamydia.  Check HIV test.

## 2018-03-28 NOTE — Assessment & Plan Note (Signed)
Trial of MiraLAX. 

## 2018-03-28 NOTE — Patient Instructions (Signed)
Nice to see you. Please try the MiraLAX for your constipation. Please continue to stay active. We will check lab work today and contact you with the results.

## 2018-03-28 NOTE — Assessment & Plan Note (Signed)
Physical exam completed.  He will continue diet and exercise.  He will check with his insurance regarding Shingrix.  Lab work as outlined below.

## 2018-03-28 NOTE — Assessment & Plan Note (Signed)
Urinary frequency possibly related to this.  He will continue Flomax.

## 2018-03-28 NOTE — Assessment & Plan Note (Signed)
Noted on prior urinalysis.  Recheck urine today.

## 2018-03-29 LAB — HIV ANTIBODY (ROUTINE TESTING W REFLEX): HIV 1&2 Ab, 4th Generation: NONREACTIVE

## 2018-03-31 ENCOUNTER — Encounter: Payer: Self-pay | Admitting: Family Medicine

## 2018-03-31 LAB — URINE CYTOLOGY ANCILLARY ONLY
Chlamydia: NEGATIVE
Neisseria Gonorrhea: NEGATIVE

## 2018-04-01 ENCOUNTER — Telehealth: Payer: Self-pay | Admitting: *Deleted

## 2018-04-01 DIAGNOSIS — D582 Other hemoglobinopathies: Secondary | ICD-10-CM

## 2018-04-01 DIAGNOSIS — R972 Elevated prostate specific antigen [PSA]: Secondary | ICD-10-CM

## 2018-04-01 NOTE — Telephone Encounter (Signed)
Patient called for lab results: notified of following. Patient does want referral to urology. Patient would like to work on diet as discussed at appointment. CBC scheduled to check hemoglobin in 3 weeks if that is appropriate- please order.    Notes recorded by Glori Luis, MD on 03/31/2018 at 8:29 AM EDT Please let the patient know that his PSA has gone back up. He will need to see urology again to consider further evaluation. I can place a referral back to them once you speak with him. His cholesterol is very near goal and his current risk percentage is in an intermediate range indicating that he could work on diet and exercise to help lower his cholesterol or could consider medication. His hemoglobin is mildly elevated and we should recheck that in 2-3 weeks. His other lab work is acceptable. Thanks.   Lab not in triage basket

## 2018-04-03 NOTE — Addendum Note (Signed)
Addended by: Glori Luis on: 04/03/2018 10:18 AM   Modules accepted: Orders

## 2018-04-03 NOTE — Telephone Encounter (Signed)
Referral placed.  CBC ordered.

## 2018-04-07 IMAGING — DX DG CHEST 1V PORT
1 series · 1 of 1 positions shown · non-contrast
Comparison: Yesterday

CLINICAL DATA: Pneumothorax

EXAM:
PORTABLE CHEST 1 VIEW

[chest ap]
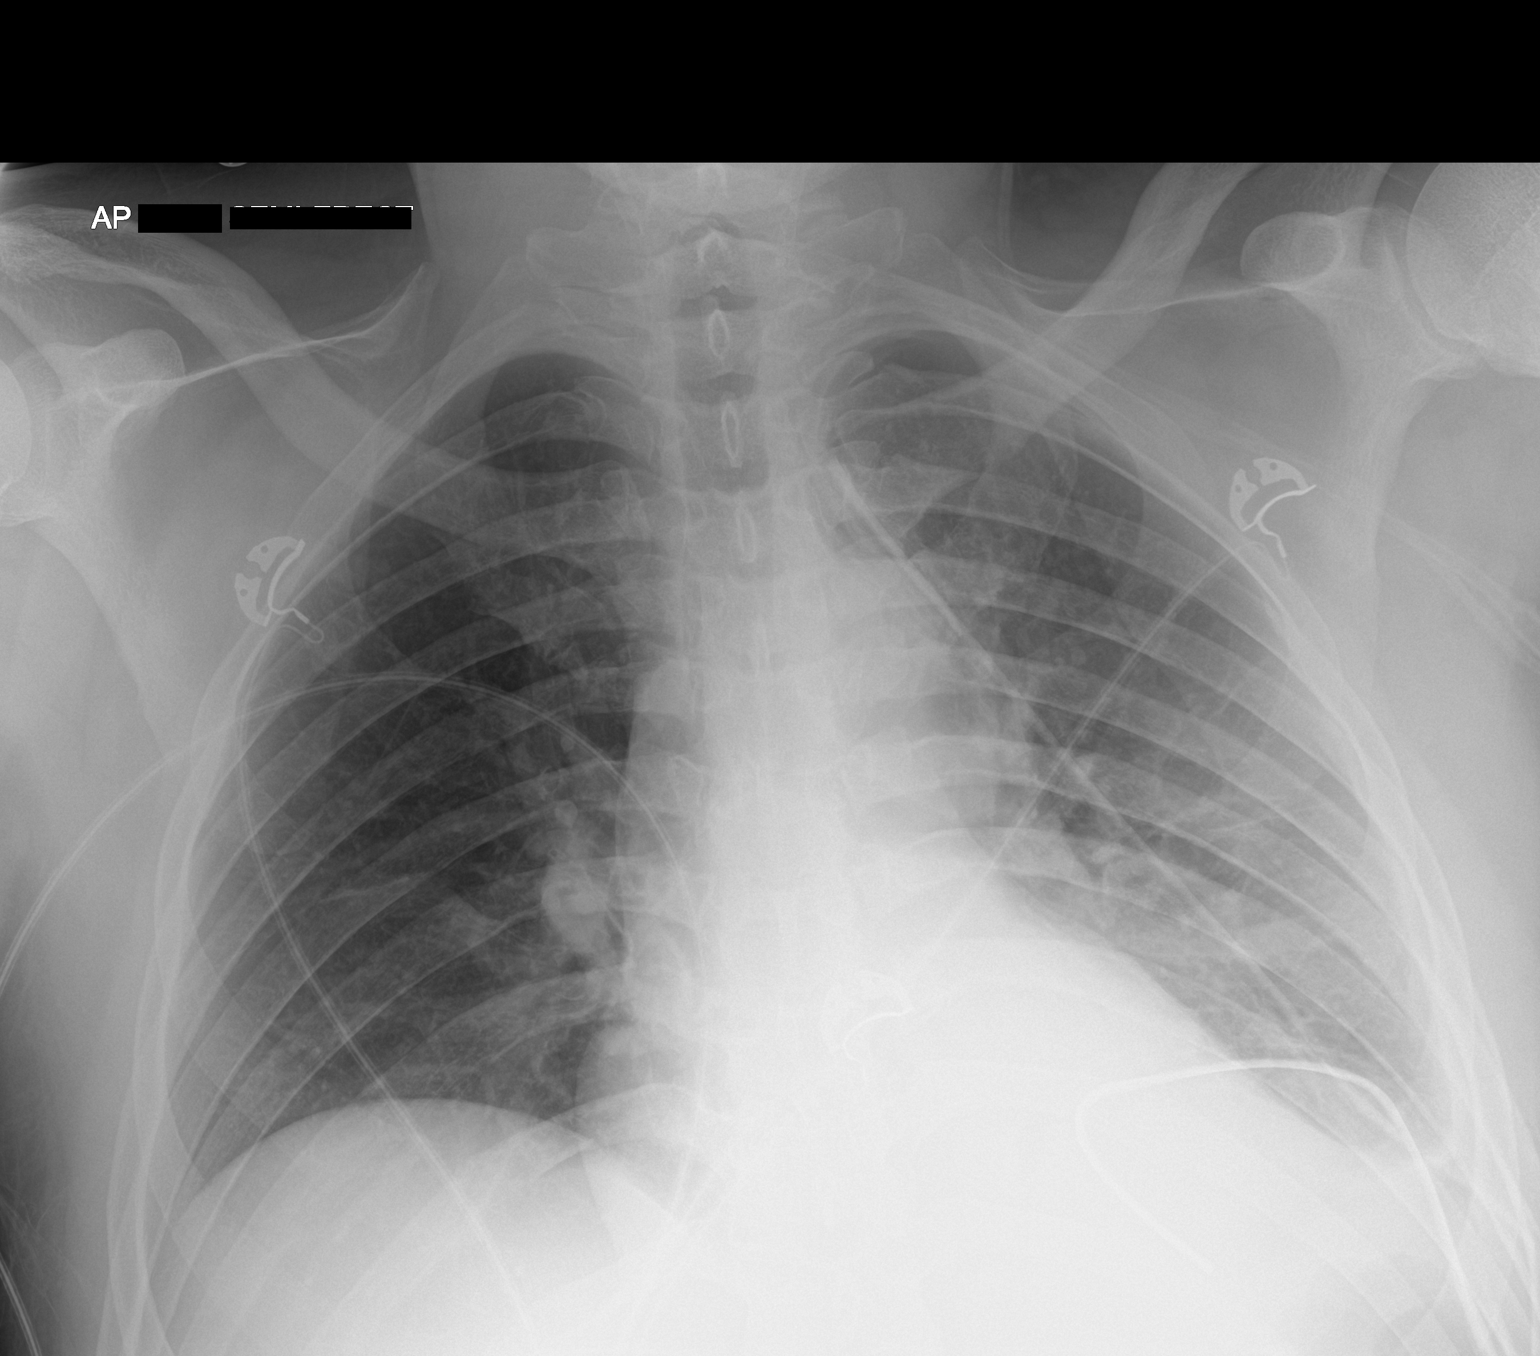

[1 of 1 positions shown; findings below may reference images not displayed]

FINDINGS: Left chest tubes are stable with its tip towards the left hemithorax
apex. There is no evidence of pneumothorax. Scattered subsegmental
atelectasis in the left mid and lower lung zones. Right lung is
clear. Normal heart size.
IMPRESSION: Stable left chest tube without pneumothorax.

## 2018-04-08 IMAGING — DX DG CHEST 1V PORT
1 series · 1 of 1 positions shown · non-contrast
Comparison: 01/16/2017.  CT 01/07/2017.

CLINICAL DATA: Pneumothorax.  Chest tube .

EXAM:
PORTABLE CHEST 1 VIEW

[chest ap]
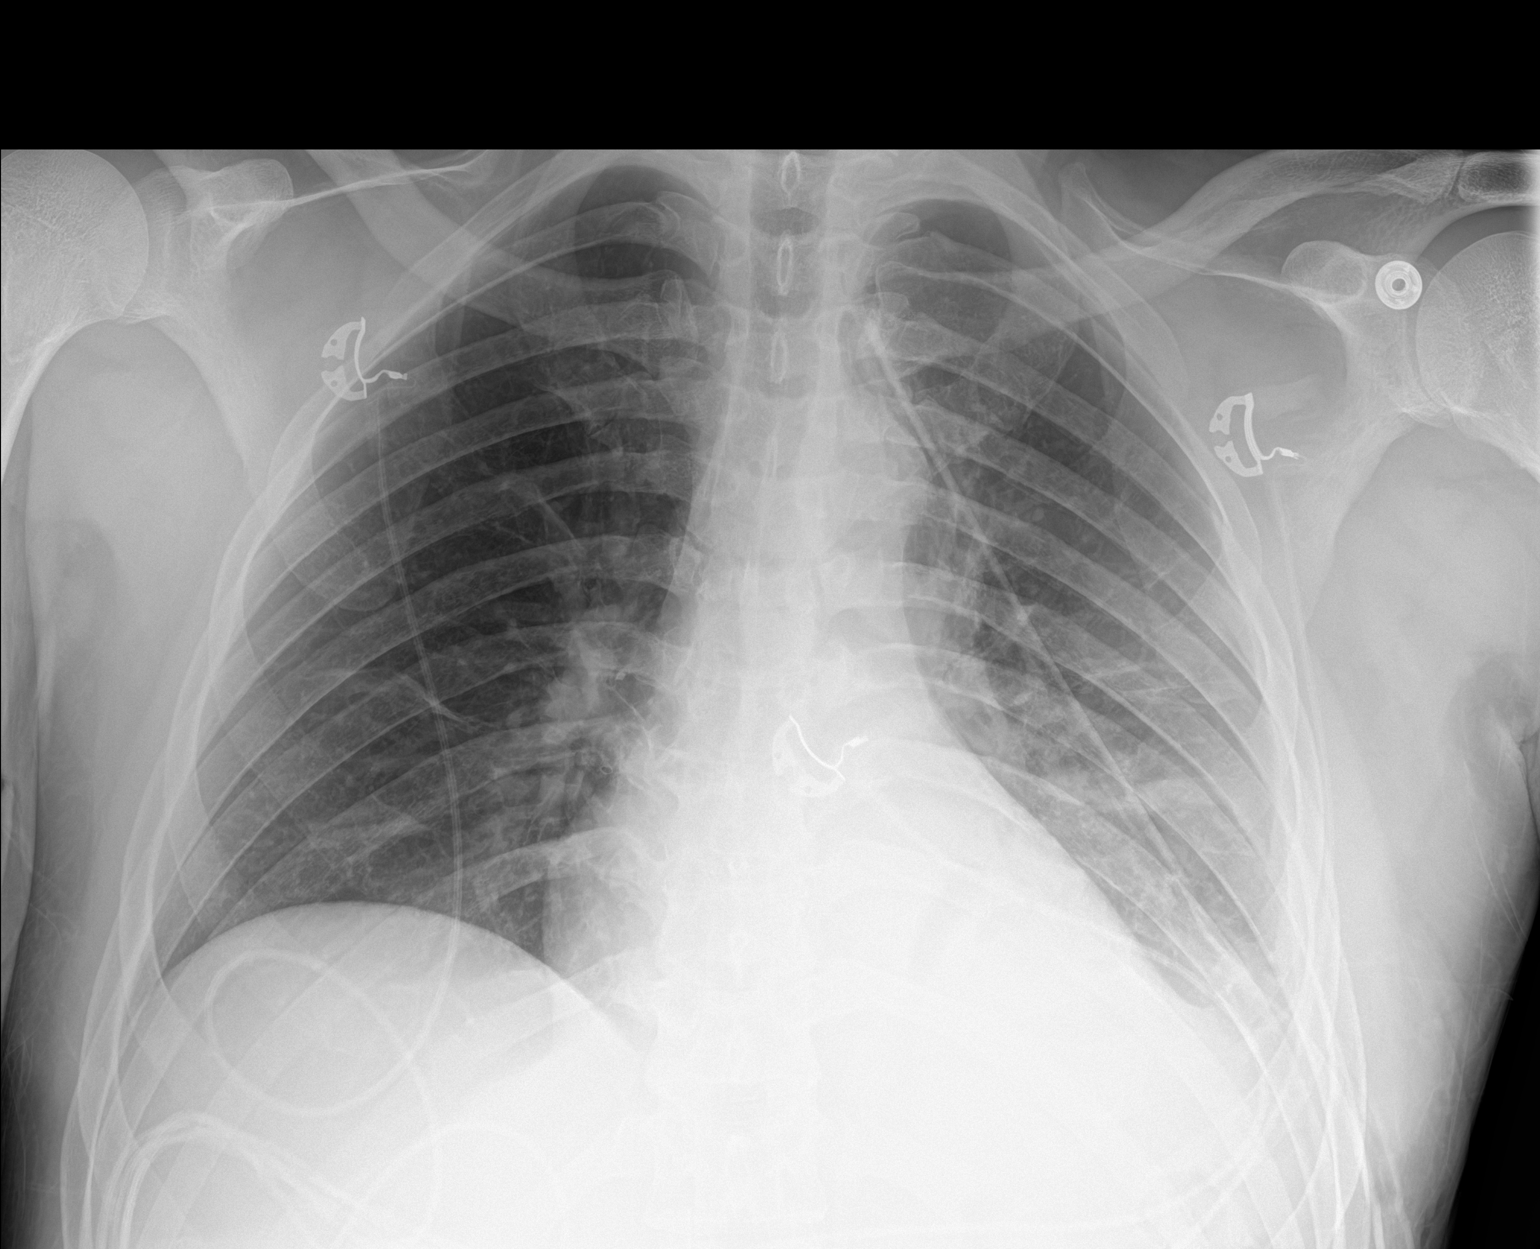

[1 of 1 positions shown; findings below may reference images not displayed]

FINDINGS: Left chest tube in stable position. No pneumothorax . Left base
atelectasis and infiltrate. Mild right base subsegmental
atelectasis. Small left pleural effusion. Heart size stable. No
acute bony abnormality.
IMPRESSION: 1. Left chest tube in stable position.  No pneumothorax.

2. Left base subsegmental atelectasis and or infiltrate. Mild right
base subsegmental atelectasis. Small left pleural effusion . No
significant change from prior exam .

## 2018-04-09 ENCOUNTER — Encounter: Payer: Self-pay | Admitting: *Deleted

## 2018-04-09 IMAGING — DX DG CHEST 2V
2 series · 2 of 2 positions shown · non-contrast
Comparison: January 17, 2017

CLINICAL DATA: Status post chest tube removal

EXAM:
CHEST  2 VIEW

[chest pa]
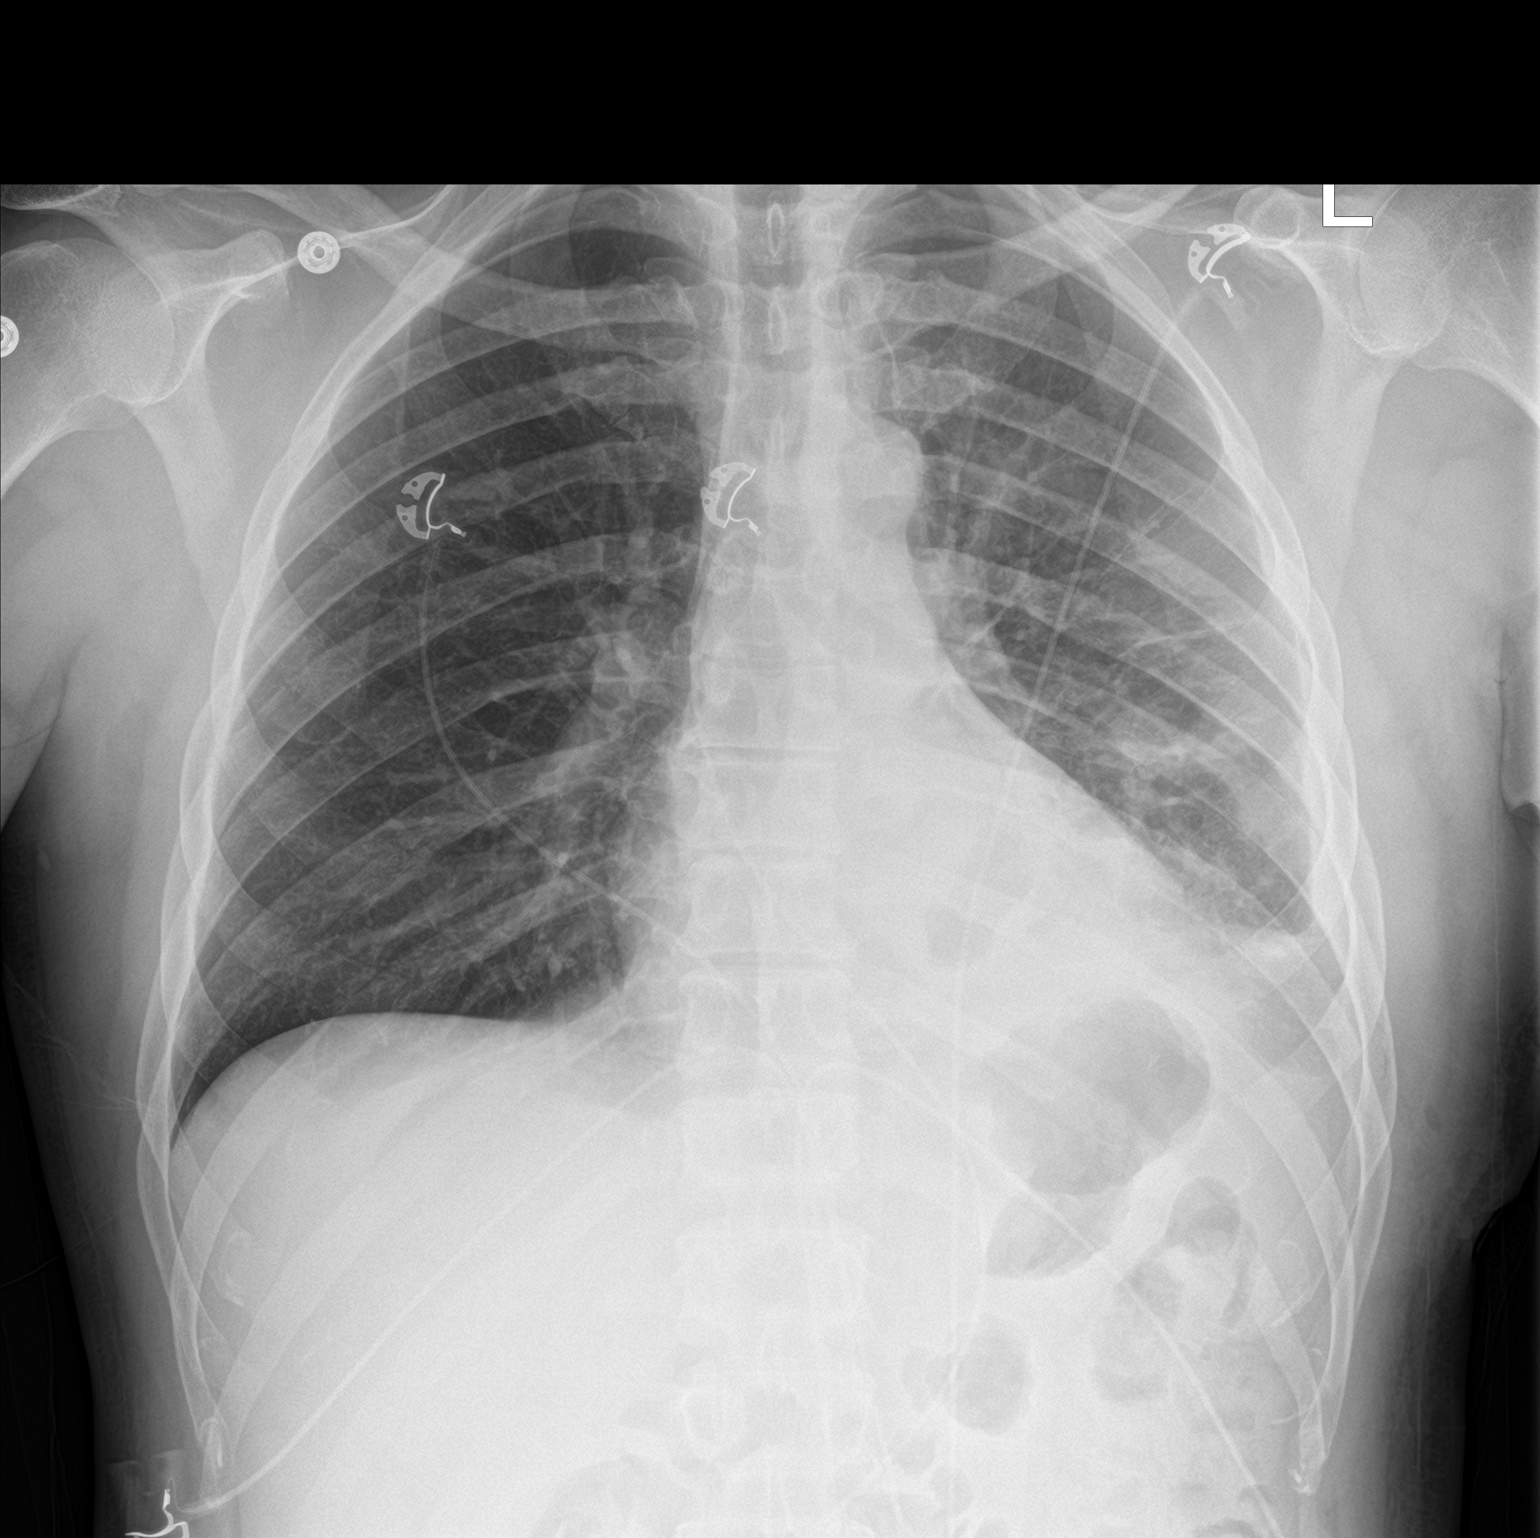

[chest lat]
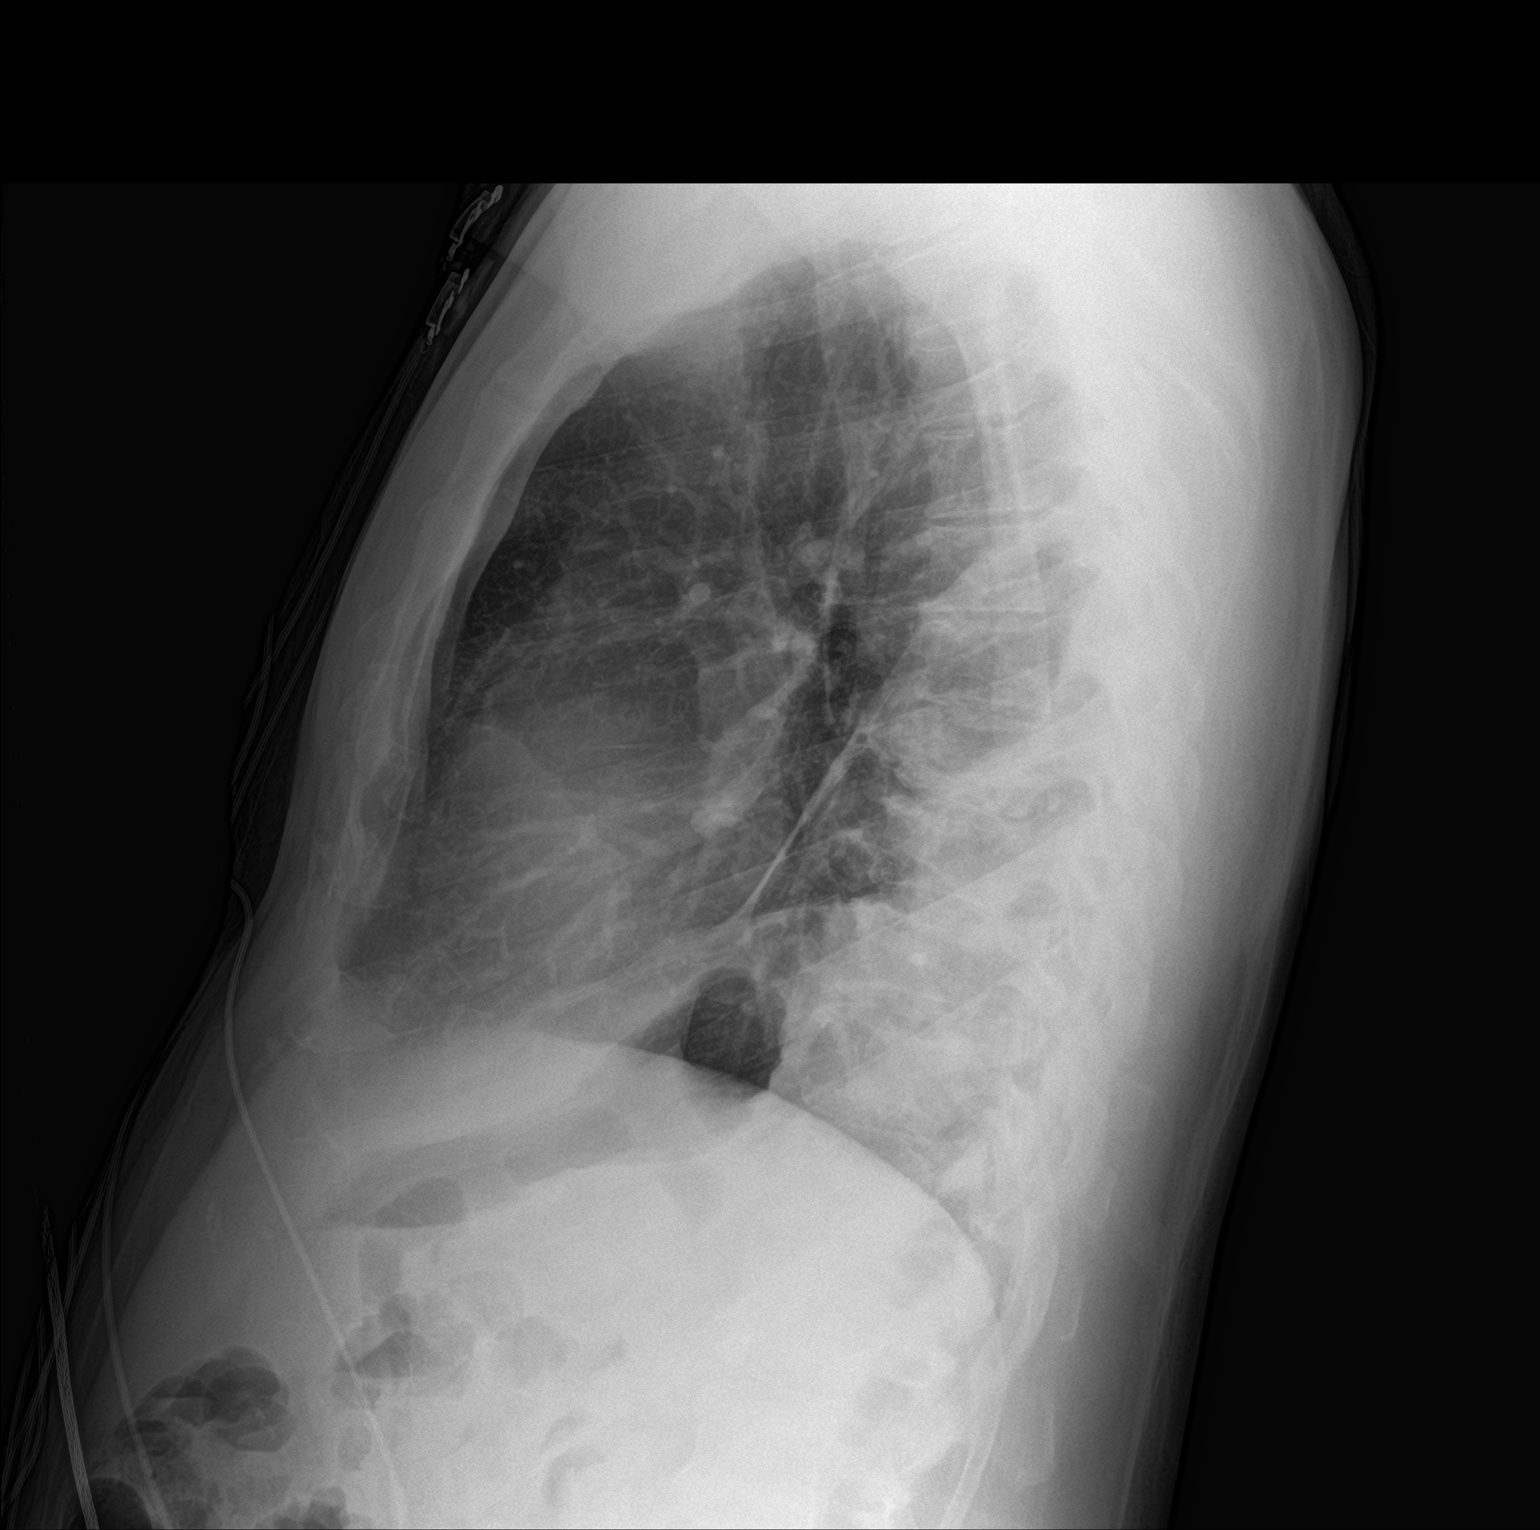

[2 of 2 positions shown; findings below may reference images not displayed]

FINDINGS: Left-sided chest tube has been removed. There is no demonstrable
pneumothorax. There is a small left pleural effusion with patchy
atelectasis in the left mid lower lung zones. The right lung is
clear. The heart size and pulmonary vascularity are normal. No
adenopathy. No bone lesions.
IMPRESSION: Removal of left chest tube without pneumothorax. Small left pleural
effusion with patchy atelectasis left lower lobe. Right lung clear.
Stable cardiac silhouette.

## 2018-04-23 ENCOUNTER — Ambulatory Visit: Payer: BLUE CROSS/BLUE SHIELD | Admitting: Family Medicine

## 2018-04-25 ENCOUNTER — Other Ambulatory Visit (INDEPENDENT_AMBULATORY_CARE_PROVIDER_SITE_OTHER): Payer: BLUE CROSS/BLUE SHIELD

## 2018-04-25 DIAGNOSIS — D582 Other hemoglobinopathies: Secondary | ICD-10-CM

## 2018-04-25 NOTE — Addendum Note (Signed)
Addended by: WIGGINS, Marsh Heckler N on: 04/25/2018 08:18 AM   Modules accepted: Orders  

## 2018-04-26 LAB — CBC
HCT: 45.9 % (ref 38.5–50.0)
Hemoglobin: 16 g/dL (ref 13.2–17.1)
MCH: 29.7 pg (ref 27.0–33.0)
MCHC: 34.9 g/dL (ref 32.0–36.0)
MCV: 85.3 fL (ref 80.0–100.0)
MPV: 11.8 fL (ref 7.5–12.5)
PLATELETS: 181 10*3/uL (ref 140–400)
RBC: 5.38 10*6/uL (ref 4.20–5.80)
RDW: 12.9 % (ref 11.0–15.0)
WBC: 7.8 10*3/uL (ref 3.8–10.8)

## 2018-04-30 ENCOUNTER — Ambulatory Visit: Payer: BLUE CROSS/BLUE SHIELD | Admitting: Urology

## 2018-05-20 NOTE — Progress Notes (Signed)
05/23/2018  7:53 AM   Caleb Avila 11/06/1960 [id number]  Referring provider: Glori LuisSonnenberg, [name], MD [address] [place], [place] [address]  Chief Complaint  Patient presents with   Elevated PSA    HPI: Caleb Avila is a 57 yo M who returns today for the evaluation and management of elevated PSA and BPH with LUTS.  His last visit with us was on 05/08/2017.  He was referred for PSA of 5.4 with a previous PSA of 3.08.  His PSA was repeated in December 2018 and was 3.8.  Pt denies bothersome lower urinary tract symptoms including urgency, dysuria. Pt denies gross hematuria. Pt is still on tamsulosin.   A repeat PSA performed December 2019 was 7.99.  He denies any change in his lower urinary tract symptoms.  Component     Latest Ref Rng & Units 11/25/2015 04/22/2017 03/28/2018  PSA     0.10 - 4.00 ng/mL 3.08 5.40 (H) 7.99 (H)    PMH: Past Medical History:  Diagnosis Date   Depression    Empyema lung (HCC) 01/13/2017   Frequent headaches    Hypertension    Multifocal pneumonia 01/07/2017    Surgical History: Past Surgical History:  Procedure Laterality Date   chest wall cyst     FINGER SURGERY Right    VIDEO ASSISTED THORACOSCOPY (VATS)/EMPYEMA Left 01/13/2017   Procedure: VIDEO ASSISTED THORACOSCOPY (VATS) DRAIN EMPYEMA;  Surgeon: [name], Peter, MD;  Location: [facility] OR;  Service: Thoracic;  Laterality: Left;    Home Medications:  Allergies as of 05/21/2018   No Known Allergies     Medication List        Accurate as of 05/21/18 11:59 PM. Always use your most recent med list.          amLODipine 5 MG tablet Commonly known as:  NORVASC TAKE 1 TABLET BY MOUTH EVERY DAY   aspirin 325 MG EC tablet Take 1 tablet (325 mg total) by mouth daily.   multivitamin tablet Take 1 tablet by mouth daily.   polyethylene glycol powder powder Commonly known as:  GLYCOLAX/MIRALAX Take 17 g by mouth daily as needed for mild constipation.     tamsulosin 0.4 MG Caps capsule Commonly known as:  FLOMAX Take 1 capsule (0.4 mg total) by mouth daily.       Allergies: No Known Allergies  Family History: Family History  Problem Relation Age of Onset   Depression Unknown     Social History:  reports that he has never smoked. He has never used smokeless tobacco. He reports that he does not drink alcohol or use drugs.  ROS: UROLOGY Frequent Urination?: Yes Hard to postpone urination?: No Burning/pain with urination?: No Get up at night to urinate?: No Leakage of urine?: No Urine stream starts and stops?: No Trouble starting stream?: No Do you have to strain to urinate?: No Blood in urine?: No Urinary tract infection?: No Sexually transmitted disease?: No Injury to kidneys or bladder?: No Painful intercourse?: No Weak stream?: No Erection problems?: No Penile pain?: No  Gastrointestinal Nausea?: No Vomiting?: No Indigestion/heartburn?: No Diarrhea?: No Constipation?: No  Constitutional Fever: No Night sweats?: No Weight loss?: No Fatigue?: No  Skin Skin rash/lesions?: No Itching?: No  Eyes Blurred vision?: No Double vision?: No  Ears/Nose/Throat Sore throat?: No Sinus problems?: No  Hematologic/Lymphatic Swollen glands?: No Easy bruising?: No  Cardiovascular Leg swelling?: No Chest pain?: No  Respiratory Cough?: No Shortness of breath?: No  Endocrine  Excessive thirst?: No  Musculoskeletal Back pain?: No Joint pain?: No  Neurological Headaches?: No Dizziness?: No  Psychologic Depression?: No Anxiety?: No  Physical Exam: BP 137/80 (BP Location: Left Arm, Patient Position: Sitting, Cuff Size: Normal)    Pulse 80    Ht 5\' 8"  (1.727 m)    Wt 196 lb 14.4 oz (89.3 kg)    BMI 29.94 kg/m   Constitutional: Well nourished. Alert and oriented, No acute distress. HEENT:  AT, moist mucus membranes. Trachea midline, no masses. Cardiovascular: No clubbing, cyanosis, or  edema. Respiratory: Normal respiratory effort, no increased work of breathing. GU: No CVA tenderness Urethral meatus is patent.  No penile discharge. No penile lesions or rashes. Scrotum without lesions, cysts, rashes and/or edema.  Testicles are located scrotally bilaterally. No masses are appreciated in the testicles. Left and right epididymis are nomal. Rectal: Patient with  normal sphincter tone. Anus and perineum without scarring or rashes. No rectal masses are appreciated. Prostate is approximately 40 grams, no nodules are appreciated. Seminal vesicles are normal. Skin: No rashes, bruises or suspicious lesions. Neurologic: Grossly intact, no focal deficits, moving all 4 extremities. Psychiatric: Normal mood and affect.    Assessment & Plan:    1. Elevated PSA  We again discussed potential causes of an elevated PSA including prostate cancer.  PSA was repeated today.  If it remains elevated would recommend prostate biopsy.  The value of a prostate MRI showing suspicious lesions and targeted biopsy was also discussed.  He has requested MRI prior to biopsy which is certainly reasonable.   Riki Altes, MD  [facility] [address], [address] [place], [place] [address] [phone]  I, Donne Hazel, am acting as a scribe for Dr. Lorin Picket C. Stoioff,  I, Riki Altes, MD, have reviewed all documentation for this visit. The documentation on 05/23/18 for the exam, diagnosis, procedures, and orders are all accurate and complete.

## 2018-05-21 ENCOUNTER — Encounter: Payer: Self-pay | Admitting: Urology

## 2018-05-21 ENCOUNTER — Ambulatory Visit: Payer: BLUE CROSS/BLUE SHIELD | Admitting: Urology

## 2018-05-21 VITALS — BP 137/80 | HR 80 | Ht 68.0 in | Wt 196.9 lb

## 2018-05-21 DIAGNOSIS — R972 Elevated prostate specific antigen [PSA]: Secondary | ICD-10-CM | POA: Diagnosis not present

## 2018-05-22 ENCOUNTER — Other Ambulatory Visit: Payer: Self-pay | Admitting: Urology

## 2018-05-22 ENCOUNTER — Telehealth: Payer: Self-pay | Admitting: Urology

## 2018-05-22 DIAGNOSIS — R972 Elevated prostate specific antigen [PSA]: Secondary | ICD-10-CM

## 2018-05-22 LAB — PSA: PROSTATE SPECIFIC AG, SERUM: 7 ng/mL — AB (ref 0.0–4.0)

## 2018-05-22 NOTE — Telephone Encounter (Signed)
I need to send clinicals for his MRI can you finish his notes please?   Thanks  [employer]le

## 2018-05-22 NOTE — Telephone Encounter (Signed)
Left message on machine for patient to return call

## 2018-05-22 NOTE — Telephone Encounter (Signed)
Patient notified and voiced understanding.

## 2018-05-22 NOTE — Telephone Encounter (Signed)
-----   Message from Riki AltesScott C Stoioff, MD sent at 05/22/2018  7:53 AM EST ----- Repeat PSA still elevated at 7.0.  Recommend scheduling prostate MRI.  Order was entered.  Will call with results.

## 2018-05-23 ENCOUNTER — Encounter: Payer: Self-pay | Admitting: Urology

## 2018-05-28 ENCOUNTER — Telehealth: Payer: Self-pay | Admitting: Urology

## 2018-05-28 NOTE — Telephone Encounter (Signed)
[employer] denied patient's MRI, how would you like to proceed?   Caleb Avila

## 2018-05-29 NOTE — Telephone Encounter (Signed)
Recommend scheduling prostate biopsy

## 2018-05-29 NOTE — Telephone Encounter (Signed)
Patient declined to schedule a BX said he would discuss more with you at his app with you on the 3rd  Michelle

## 2018-06-20 ENCOUNTER — Ambulatory Visit: Payer: BLUE CROSS/BLUE SHIELD | Admitting: Urology

## 2018-06-20 ENCOUNTER — Encounter: Payer: Self-pay | Admitting: Urology

## 2018-06-20 VITALS — BP 135/85 | HR 83 | Ht 68.0 in | Wt 200.8 lb

## 2018-06-20 DIAGNOSIS — R972 Elevated prostate specific antigen [PSA]: Secondary | ICD-10-CM

## 2018-06-20 NOTE — Progress Notes (Signed)
06/20/2018  2:20 PM   Caleb Avila 10/07/1960 [id number]  Referring provider: Glori LuisSonnenberg, [name], MD [address] [place], [place] [address]  Chief Complaint  Patient presents with  . Elevated PSA   Urologic History 1. Elevated PSA  - PSA trend: 3.08 ng/mL (11/2015); 5.4 ng/mL(04/2017); 7.99 ng/mL (03/2018); 7.0 ng/mL (05/2018) 2. BPH with LUTS  - see PSA trend above  - On tamsulosin 0.4 mg daily  HPI: Charlott Rakesrenton L Pherigo is a 58 y.o. male that presents today to discuss treatment options for his elevated PSA following insurance denial of diagnostic MRI  - Last PSA was 7.0ng/mL (05/2018), it remains elevated  - At last visit, patient reported a desire to pursue MRI prior to biopsy. Insurance denied MRI.   - Denies dysuria, gross hematuria or flank/abdominal/pelvic/scrotal pain.   PMH: Past Medical History:  Diagnosis Date  . Depression   . Empyema lung (HCC) 01/13/2017  . Frequent headaches   . Hypertension   . Multifocal pneumonia 01/07/2017    Surgical History: Past Surgical History:  Procedure Laterality Date  . chest wall cyst    . FINGER SURGERY Right   . VIDEO ASSISTED THORACOSCOPY (VATS)/EMPYEMA Left 01/13/2017   Procedure: VIDEO ASSISTED THORACOSCOPY (VATS) DRAIN EMPYEMA;  Surgeon: [name], Peter, MD;  [facility];  Service: Thoracic;  Laterality: Left;   Home Medications:  Allergies as of 06/20/2018   No Known Allergies     Medication List       Accurate as of June 20, 2018  2:20 PM. Always use your most recent med list.        amLODipine 5 MG tablet Commonly known as:  NORVASC TAKE 1 TABLET BY MOUTH EVERY DAY   aspirin 325 MG EC tablet Take 1 tablet (325 mg total) by mouth daily.   multivitamin tablet Take 1 tablet by mouth daily.   polyethylene glycol powder powder Commonly known as:  GLYCOLAX/MIRALAX Take 17 g by mouth daily as needed for mild constipation.   tamsulosin 0.4 MG Caps capsule Commonly known as:   FLOMAX Take 1 capsule (0.4 mg total) by mouth daily.       Allergies: No Known Allergies  Family History: Family History  Problem Relation Age of Onset  . Depression Other     Social History:  reports that he has never smoked. He has never used smokeless tobacco. He reports that he does not drink alcohol or use drugs.  ROS: UROLOGY Frequent Urination?: Yes Hard to postpone urination?: No Burning/pain with urination?: No Get up at night to urinate?: No Leakage of urine?: No Urine stream starts and stops?: No Trouble starting stream?: No Do you have to strain to urinate?: No Blood in urine?: No Urinary tract infection?: No Sexually transmitted disease?: No Injury to kidneys or bladder?: No Painful intercourse?: No Weak stream?: No Erection problems?: No Penile pain?: No  Gastrointestinal Nausea?: No Vomiting?: No Indigestion/heartburn?: No Diarrhea?: No Constipation?: No  Constitutional Fever: No Night sweats?: No Weight loss?: No Fatigue?: No  Skin Skin rash/lesions?: No Itching?: No  Eyes Blurred vision?: No Double vision?: No  Ears/Nose/Throat Sore throat?: No Sinus problems?: No  Hematologic/Lymphatic Swollen glands?: No Easy bruising?: No  Cardiovascular Leg swelling?: No Chest pain?: No  Respiratory Cough?: No Shortness of breath?: No  Endocrine Excessive thirst?: No  Musculoskeletal Back pain?: No Joint pain?: No  Neurological Headaches?: No Dizziness?: No  Psychologic Depression?: No Anxiety?: No  Physical Exam: BP 135/85 (BP Location:  Left Arm, Patient Position: Sitting, Cuff Size: Large)   Pulse 83   Ht 5\' 8"  (1.727 m)   Wt 200 lb 12.8 oz (91.1 kg)   BMI 30.53 kg/m   Constitutional:  Alert and oriented, No acute distress. Respiratory: Normal respiratory effort, no increased work of breathing. GU: No CVA tenderness Skin: No rashes, bruises or suspicious lesions. Neurologic: Grossly intact, no focal deficits,  moving all 4 extremities. Psychiatric: Normal mood and affect.  Laboratory Data Lab Results  Component Value Date   PSA 7.99 (H) 03/28/2018   PSA 5.40 (H) 04/22/2017   PSA 3.08 11/25/2015   Assessment & Plan:   1. Elevated PSA  - PSA today was 7.0 ng/mL. His previous PSA was 7.99 ng/mL  - Insurance denied MRI prior to biopsy. Discussed appeal process.   - Discussed options outside of MRI including biopsy and 4k score. Provided patient with literature on 4K score  - Pt wishes to pursue MRI and will undergo appeal process with insurance  2. BPH with LUTS  - See above   Riki Altes, MD  [facility] [address] [place], [place] [address] [phone]   I, Robbi Garter , am acting as a scribe for [employer], MD  I, Riki Altes, MD, have reviewed all documentation for this visit. The documentation on 06/20/18 for the exam, diagnosis, procedures, and orders are all accurate and complete.

## 2018-06-22 ENCOUNTER — Other Ambulatory Visit: Payer: Self-pay | Admitting: Family Medicine

## 2018-06-22 DIAGNOSIS — I10 Essential (primary) hypertension: Secondary | ICD-10-CM

## 2018-09-29 ENCOUNTER — Encounter: Payer: Self-pay | Admitting: Family Medicine

## 2018-09-29 ENCOUNTER — Telehealth: Payer: Self-pay | Admitting: Family Medicine

## 2018-09-29 ENCOUNTER — Ambulatory Visit (INDEPENDENT_AMBULATORY_CARE_PROVIDER_SITE_OTHER): Payer: BLUE CROSS/BLUE SHIELD | Admitting: Family Medicine

## 2018-09-29 DIAGNOSIS — I10 Essential (primary) hypertension: Secondary | ICD-10-CM

## 2018-09-29 DIAGNOSIS — Z6830 Body mass index (BMI) 30.0-30.9, adult: Secondary | ICD-10-CM

## 2018-09-29 DIAGNOSIS — R350 Frequency of micturition: Secondary | ICD-10-CM

## 2018-09-29 DIAGNOSIS — N401 Enlarged prostate with lower urinary tract symptoms: Secondary | ICD-10-CM

## 2018-09-29 DIAGNOSIS — K5900 Constipation, unspecified: Secondary | ICD-10-CM

## 2018-09-29 DIAGNOSIS — E66811 Obesity, class 1: Secondary | ICD-10-CM

## 2018-09-29 DIAGNOSIS — R972 Elevated prostate specific antigen [PSA]: Secondary | ICD-10-CM | POA: Diagnosis not present

## 2018-09-29 DIAGNOSIS — E6609 Other obesity due to excess calories: Secondary | ICD-10-CM

## 2018-09-29 NOTE — Telephone Encounter (Signed)
Please call the patient and get him set for labs in one month. He will need a CPE scheduled in 6 months. Thanks.

## 2018-09-29 NOTE — Assessment & Plan Note (Signed)
Patient will continue to follow with urology.  He will continue to work with his insurance company regarding a biopsy.

## 2018-09-29 NOTE — Assessment & Plan Note (Signed)
Adequately controlled at urology.  I discussed having the patient start checking it at home and monitoring his blood pressure.  He will continue amlodipine.  He will come in for lab work.

## 2018-09-29 NOTE — Telephone Encounter (Signed)
Called and spoke with patient. Pt advised and voiced understanding.  

## 2018-09-29 NOTE — Assessment & Plan Note (Signed)
Symptoms seem to have improved on Flomax.  He will continue to see urology.

## 2018-09-29 NOTE — Telephone Encounter (Signed)
Sent pt a mychart message. 

## 2018-09-29 NOTE — Progress Notes (Signed)
Virtual Visit via video Note  This visit type was conducted due to national recommendations for restrictions regarding the COVID-19 pandemic (e.g. social distancing).  This format is felt to be most appropriate for this patient at this time.  All issues noted in this document were discussed and addressed.  No physical exam was performed (except for noted visual exam findings with Video Visits).   I connected with Caleb Avila on 09/29/18 at  8:30 AM EDT by a video enabled telemedicine application or telephone and verified that I am speaking with the correct person using two identifiers. Location patient: home Location provider: work Persons participating in the virtual visit: patient, provider  I discussed the limitations, risks, security and privacy concerns of performing an evaluation and management service by telephone and the availability of in person appointments. I also discussed with the patient that there may be a patient responsible charge related to this service. The patient expressed understanding and agreed to proceed.  Reason for visit: follow-up  HPI: HYPERTENSION  Disease Monitoring  Home BP Monitoring not checking though it was 135/85 at urology Chest pain- no    Dyspnea- no Medications  Compliance-  Taking amlodipine.  Edema- no  Constipation: Notes bowel movements every 1 to 2 days.  No abdominal pain.  No blood in his stool.  No nausea.  No vomiting.  He occasionally uses MiraLAX.  Elevated PSA: Patient is following with urology for this.  They are continuing to work with his insurance to try to get a biopsy covered.  The patient notes the MRI was not covered and now they are not going to cover the biopsy.  BPH: Patient notes he is drinking more water so is urinating more.  No urgency.  No flow issues.  He does feel like he empties his bladder.  He is on Flomax.  Obesity: Patient notes he has lost a couple of pounds with walking most days.  He is eating more greens.   He is drinking 1 soda per day.     ROS: See pertinent positives and negatives per HPI.  Past Medical History:  Diagnosis Date  . Depression   . Empyema lung (HCC) 01/13/2017  . Frequent headaches   . Hypertension   . Multifocal pneumonia 01/07/2017    Past Surgical History:  Procedure Laterality Date  . chest wall cyst    . FINGER SURGERY Right   . VIDEO ASSISTED THORACOSCOPY (VATS)/EMPYEMA Left 01/13/2017   Procedure: VIDEO ASSISTED THORACOSCOPY (VATS) DRAIN EMPYEMA;  Surgeon: [name], Peter, MD;  [facility];  Service: Thoracic;  Laterality: Left;    Family History  Problem Relation Age of Onset  . Depression Other     SOCIAL HX: nonsmoker   Current Outpatient Medications:  .  amLODipine (NORVASC) 5 MG tablet, TAKE 1 TABLET BY MOUTH EVERY DAY, Disp: 90 tablet, Rfl: 1 .  aspirin EC 325 MG EC tablet, Take 1 tablet (325 mg total) by mouth daily., Disp: 30 tablet, Rfl: 0 .  Multiple Vitamin (MULTIVITAMIN) tablet, Take 1 tablet by mouth daily., Disp: , Rfl:  .  polyethylene glycol powder (GLYCOLAX/MIRALAX) powder, Take 17 g by mouth daily as needed for mild constipation. (Patient taking differently: Take 17 g by mouth as needed for mild constipation. ), Disp: 500 g, Rfl: 0 .  tamsulosin (FLOMAX) 0.4 MG CAPS capsule, Take 1 capsule (0.4 mg total) by mouth daily., Disp: 90 capsule, Rfl: 3  EXAM:  VITALS per patient if applicable: none  GENERAL:  alert, oriented, appears well and in no acute distress  HEENT: atraumatic, conjunttiva clear, no obvious abnormalities on inspection of external nose and ears  NECK: normal movements of the head and neck  LUNGS: on inspection no signs of respiratory distress, breathing rate appears normal, no obvious gross SOB, gasping or wheezing  MS: moves all visible extremities without noticeable abnormality  PSYCH/NEURO: pleasant and cooperative, no obvious depression or anxiety, speech and thought processing grossly intact  ASSESSMENT  AND PLAN:  Discussed the following assessment and plan:  Essential hypertension  Benign prostatic hyperplasia with urinary frequency  Constipation, unspecified constipation type  Elevated PSA  Class 1 obesity due to excess calories with serious comorbidity and body mass index (BMI) of 30.0 to 30.9 in adult  Essential hypertension Adequately controlled at urology.  I discussed having the patient start checking it at home and monitoring his blood pressure.  He will continue amlodipine.  He will come in for lab work.  Benign prostatic hyperplasia with urinary frequency Symptoms seem to have improved on Flomax.  He will continue to see urology.  Constipation Stable.  Continue as needed MiraLAX.  Elevated PSA Patient will continue to follow with urology.  He will continue to work with his insurance company regarding a biopsy.  Obesity He will continue to stay active and work on continued dietary changes.    I discussed the assessment and treatment plan with the patient. The patient was provided an opportunity to ask questions and all were answered. The patient agreed with the plan and demonstrated an understanding of the instructions.   The patient was advised to call back or seek an in-person evaluation if the symptoms worsen or if the condition fails to improve as anticipated.   Marikay Alar, MD

## 2018-09-29 NOTE — Assessment & Plan Note (Signed)
He will continue to stay active and work on continued dietary changes.

## 2018-09-29 NOTE — Assessment & Plan Note (Signed)
Stable.  Continue as needed MiraLAX.

## 2018-10-15 ENCOUNTER — Other Ambulatory Visit: Payer: Self-pay

## 2018-10-15 DIAGNOSIS — R350 Frequency of micturition: Principal | ICD-10-CM

## 2018-10-15 DIAGNOSIS — R972 Elevated prostate specific antigen [PSA]: Secondary | ICD-10-CM

## 2018-10-15 DIAGNOSIS — N401 Enlarged prostate with lower urinary tract symptoms: Secondary | ICD-10-CM

## 2018-10-15 MED ORDER — TAMSULOSIN HCL 0.4 MG PO CAPS: 0.4000 mg | ORAL_CAPSULE | Freq: Every day | ORAL | 3 refills | Status: DC

## 2018-10-30 ENCOUNTER — Other Ambulatory Visit: Payer: Self-pay

## 2018-10-30 ENCOUNTER — Other Ambulatory Visit: Payer: BLUE CROSS/BLUE SHIELD

## 2018-10-30 ENCOUNTER — Other Ambulatory Visit (INDEPENDENT_AMBULATORY_CARE_PROVIDER_SITE_OTHER): Payer: BLUE CROSS/BLUE SHIELD

## 2018-10-30 DIAGNOSIS — I10 Essential (primary) hypertension: Secondary | ICD-10-CM | POA: Diagnosis not present

## 2018-10-31 LAB — COMPREHENSIVE METABOLIC PANEL
ALT: 64 U/L — ABNORMAL HIGH (ref 0–53)
AST: 37 U/L (ref 0–37)
Albumin: 4.4 g/dL (ref 3.5–5.2)
Alkaline Phosphatase: 82 U/L (ref 39–117)
BUN: 19 mg/dL (ref 6–23)
CO2: 30 mEq/L (ref 19–32)
Calcium: 9.2 mg/dL (ref 8.4–10.5)
Chloride: 103 mEq/L (ref 96–112)
Creatinine, Ser: 1.35 mg/dL (ref 0.40–1.50)
GFR: 54.73 mL/min — ABNORMAL LOW (ref >60.00)
Glucose, Bld: 94 mg/dL (ref 70–99)
Potassium: 4.2 mEq/L (ref 3.5–5.1)
Sodium: 141 mEq/L (ref 135–145)
Total Bilirubin: 0.3 mg/dL (ref 0.2–1.2)
Total Protein: 7.3 g/dL (ref 6.0–8.3)

## 2018-10-31 LAB — LDL CHOLESTEROL, DIRECT: Direct LDL: 93 mg/dL

## 2018-11-04 ENCOUNTER — Other Ambulatory Visit: Payer: Self-pay | Admitting: Family Medicine

## 2018-11-04 DIAGNOSIS — R7989 Other specified abnormal findings of blood chemistry: Secondary | ICD-10-CM

## 2018-12-03 ENCOUNTER — Other Ambulatory Visit: Payer: BLUE CROSS/BLUE SHIELD

## 2018-12-04 ENCOUNTER — Other Ambulatory Visit (INDEPENDENT_AMBULATORY_CARE_PROVIDER_SITE_OTHER): Payer: BC Managed Care – PPO

## 2018-12-04 ENCOUNTER — Other Ambulatory Visit: Payer: Self-pay

## 2018-12-04 DIAGNOSIS — R945 Abnormal results of liver function studies: Secondary | ICD-10-CM | POA: Diagnosis not present

## 2018-12-04 DIAGNOSIS — R7989 Other specified abnormal findings of blood chemistry: Secondary | ICD-10-CM

## 2018-12-04 DIAGNOSIS — R972 Elevated prostate specific antigen [PSA]: Secondary | ICD-10-CM

## 2018-12-05 LAB — COMPREHENSIVE METABOLIC PANEL
ALT: 58 U/L — ABNORMAL HIGH (ref 0–53)
AST: 32 U/L (ref 0–37)
Albumin: 4.5 g/dL (ref 3.5–5.2)
Alkaline Phosphatase: 87 U/L (ref 39–117)
BUN: 20 mg/dL (ref 6–23)
CO2: 28 mEq/L (ref 19–32)
Calcium: 9.6 mg/dL (ref 8.4–10.5)
Chloride: 105 mEq/L (ref 96–112)
Creatinine, Ser: 1.42 mg/dL (ref 0.40–1.50)
GFR: 51.61 mL/min — ABNORMAL LOW (ref >60.00)
Glucose, Bld: 84 mg/dL (ref 70–99)
Potassium: 4 mEq/L (ref 3.5–5.1)
Sodium: 142 mEq/L (ref 135–145)
Total Bilirubin: 0.4 mg/dL (ref 0.2–1.2)
Total Protein: 7.2 g/dL (ref 6.0–8.3)

## 2018-12-10 ENCOUNTER — Other Ambulatory Visit: Payer: Self-pay | Admitting: Family Medicine

## 2018-12-10 DIAGNOSIS — R7989 Other specified abnormal findings of blood chemistry: Secondary | ICD-10-CM

## 2018-12-10 NOTE — Progress Notes (Signed)
c-met  

## 2018-12-11 ENCOUNTER — Telehealth: Payer: Self-pay | Admitting: Family Medicine

## 2018-12-11 NOTE — Telephone Encounter (Unsigned)
Copied from [place] [phone]. Topic: Quick Communication - See Telephone Encounter >> Dec 11, 2018  5:06 PM Parke Poisson wrote: CRM for notification. See Telephone encounter for: 12/11/18.Pt returned call and is requesting lab results

## 2018-12-12 ENCOUNTER — Telehealth: Payer: Self-pay | Admitting: Family Medicine

## 2018-12-12 NOTE — Telephone Encounter (Signed)
Pt given lab results per notes of Dr Caryl Bis on 12/04/2018. Pt verbalized understanding. Pt denies vomiting and diarrhea He states he is drinking water. He takes 3 aleve for headaches once a week. Pt was advised to stop the Aleve or only take the dose the package advises.

## 2018-12-15 NOTE — Telephone Encounter (Signed)
Called to schedule pt a lab appt.  No answer.  LMTCB.  Will forward to scheduling department to try again.

## 2018-12-16 ENCOUNTER — Other Ambulatory Visit: Payer: Self-pay | Admitting: Family Medicine

## 2018-12-16 DIAGNOSIS — I10 Essential (primary) hypertension: Secondary | ICD-10-CM

## 2018-12-16 NOTE — Telephone Encounter (Signed)
Noted.  We need to recheck his labs.  Please make sure he gets scheduled.  Thanks.

## 2018-12-17 ENCOUNTER — Telehealth: Payer: Self-pay | Admitting: Family Medicine

## 2018-12-17 NOTE — Telephone Encounter (Signed)
I called pt and left a vm to call ofc to schedule labs.

## 2019-03-26 ENCOUNTER — Other Ambulatory Visit: Payer: Self-pay

## 2019-03-30 ENCOUNTER — Other Ambulatory Visit: Payer: Self-pay

## 2019-03-30 ENCOUNTER — Ambulatory Visit (INDEPENDENT_AMBULATORY_CARE_PROVIDER_SITE_OTHER): Payer: BC Managed Care – PPO | Admitting: Family Medicine

## 2019-03-30 ENCOUNTER — Encounter: Payer: Self-pay | Admitting: Family Medicine

## 2019-03-30 VITALS — BP 120/80 | HR 65 | Temp 98.1°F | Ht 68.0 in | Wt 203.6 lb

## 2019-03-30 DIAGNOSIS — Z23 Encounter for immunization: Secondary | ICD-10-CM | POA: Diagnosis not present

## 2019-03-30 DIAGNOSIS — D582 Other hemoglobinopathies: Secondary | ICD-10-CM | POA: Diagnosis not present

## 2019-03-30 DIAGNOSIS — R972 Elevated prostate specific antigen [PSA]: Secondary | ICD-10-CM | POA: Diagnosis not present

## 2019-03-30 DIAGNOSIS — Z1211 Encounter for screening for malignant neoplasm of colon: Secondary | ICD-10-CM

## 2019-03-30 DIAGNOSIS — E669 Obesity, unspecified: Secondary | ICD-10-CM

## 2019-03-30 DIAGNOSIS — Z13220 Encounter for screening for lipoid disorders: Secondary | ICD-10-CM | POA: Diagnosis not present

## 2019-03-30 DIAGNOSIS — Z0000 Encounter for general adult medical examination without abnormal findings: Secondary | ICD-10-CM

## 2019-03-30 DIAGNOSIS — Z114 Encounter for screening for human immunodeficiency virus [HIV]: Secondary | ICD-10-CM

## 2019-03-30 DIAGNOSIS — Z5320 Procedure and treatment not carried out because of patient's decision for unspecified reasons: Secondary | ICD-10-CM

## 2019-03-30 LAB — COMPREHENSIVE METABOLIC PANEL
ALT: 66 U/L — ABNORMAL HIGH (ref 0–53)
AST: 37 U/L (ref 0–37)
Albumin: 4.5 g/dL (ref 3.5–5.2)
Alkaline Phosphatase: 77 U/L (ref 39–117)
BUN: 18 mg/dL (ref 6–23)
CO2: 25 mEq/L (ref 19–32)
Calcium: 9.9 mg/dL (ref 8.4–10.5)
Chloride: 105 mEq/L (ref 96–112)
Creatinine, Ser: 1.17 mg/dL (ref 0.40–1.50)
GFR: 64.46 mL/min (ref >60.00)
Glucose, Bld: 95 mg/dL (ref 70–99)
Potassium: 4.3 mEq/L (ref 3.5–5.1)
Sodium: 140 mEq/L (ref 135–145)
Total Bilirubin: 0.6 mg/dL (ref 0.2–1.2)
Total Protein: 7.7 g/dL (ref 6.0–8.3)

## 2019-03-30 LAB — CBC
HCT: 51.1 % (ref 39.0–52.0)
Hemoglobin: 17.1 g/dL — ABNORMAL HIGH (ref 13.0–17.0)
MCHC: 33.4 g/dL (ref 30.0–36.0)
MCV: 90.1 fl (ref 78.0–100.0)
Platelets: 182 10*3/uL (ref 150.0–400.0)
RBC: 5.67 Mil/uL (ref 4.22–5.81)
RDW: 13.3 % (ref 11.5–15.5)
WBC: 7.1 10*3/uL (ref 4.0–10.5)

## 2019-03-30 LAB — LIPID PANEL
Cholesterol: 189 mg/dL (ref 0–200)
HDL: 34.8 mg/dL — ABNORMAL LOW (ref >39.00)
LDL Cholesterol: 123 mg/dL — ABNORMAL HIGH (ref 0–99)
NonHDL: 154.12
Total CHOL/HDL Ratio: 5
Triglycerides: 155 mg/dL — ABNORMAL HIGH (ref 0.0–149.0)
VLDL: 31 mg/dL (ref 0.0–40.0)

## 2019-03-30 LAB — PSA: PSA: 6.72 ng/mL — ABNORMAL HIGH (ref 0.10–4.00)

## 2019-03-30 LAB — HEMOGLOBIN A1C: Hgb A1c MFr Bld: 5.8 % (ref 4.6–6.5)

## 2019-03-30 NOTE — Progress Notes (Signed)
Tommi Rumps, MD Phone: [phone]  Caleb Avila is a 58 y.o. male who presents today for CPE.  Exercise: Walking 2 days a week for about 40 minutes.  Does some hills with this. Diet: He has been eating pretty much anything.  He eats more fruits and vegetables than he did previously.  He has been trying to cut down on sweets and fried food. No family history of prostate cancer or colon cancer. He is due for colonoscopy. He does have a history of an elevated PSA.  He reports his insurance would not approve an MRI or biopsy.  He does follow with urology for this. Flu vaccine given.  Tetanus vaccine up-to-date.  Due for Shingrix. He is up-to-date on hepatitis C and HIV screening though he requests HIV screening again.  He notes no concerns regarding this though he just would like to be safe. No tobacco use or illicit drug use.  Rare alcohol use. Sees a dentist and an ophthalmologist.  Active Ambulatory Problems    Diagnosis Date Noted  . Routine general medical examination at a health care facility 11/04/2015  . Essential hypertension 11/04/2015  . Constipation 11/04/2015  . Heat intolerance 11/04/2015  . Cutaneous skin tags 12/07/2015  . Elevated PSA 05/08/2017  . Benign prostatic hyperplasia with urinary frequency 05/08/2017  . Obesity 10/15/2017  . History of sexually transmitted disease 03/28/2018  . Proteinuria 03/28/2018   Resolved Ambulatory Problems    Diagnosis Date Noted  . CAP (community acquired pneumonia) 01/07/2017  . Sepsis ([place]) 01/07/2017  . Multifocal pneumonia 01/07/2017  . Acute respiratory failure with hypoxia ([place]) 01/07/2017  . SOB (shortness of breath)   . Pleural effusion   . Empyema ([place])   . Hypoxemia   . Empyema lung ([place]) 01/13/2017   Past Medical History:  Diagnosis Date  . Depression   . Frequent headaches   . Hypertension     Family History  Problem Relation Age of Onset  . Depression Other     Social History    Socioeconomic History  . Marital status: Single    Spouse name: Not on file  . Number of children: Not on file  . Years of education: Not on file  . Highest education level: Not on file  Occupational History  . Not on file  Social Needs  . Financial resource strain: Not on file  . Food insecurity    Worry: Not on file    Inability: Not on file  . Transportation needs    Medical: Not on file    Non-medical: Not on file  Tobacco Use  . Smoking status: Never Smoker  . Smokeless tobacco: Never Used  Substance and Sexual Activity  . Alcohol use: No    Alcohol/week: 0.0 standard drinks  . Drug use: No  . Sexual activity: Yes    Birth control/protection: None  Lifestyle  . Physical activity    Days per week: Not on file    Minutes per session: Not on file  . Stress: Not on file  Relationships  . Social Herbalist on phone: Not on file    Gets together: Not on file    Attends religious service: Not on file    Active member of club or organization: Not on file    Attends meetings of clubs or organizations: Not on file    Relationship status: Not on file  . Intimate partner violence    Fear of current or ex partner:  Not on file    Emotionally abused: Not on file    Physically abused: Not on file    Forced sexual activity: Not on file  Other Topics Concern  . Not on file  Social History Narrative  . Not on file    ROS  General:  Negative for nexplained weight loss, fever Skin: Negative for new or changing mole, sore that won't heal HEENT: Negative for trouble hearing, trouble seeing, ringing in ears, mouth sores, hoarseness, change in voice, dysphagia. CV:  Negative for chest pain, dyspnea, edema, palpitations Resp: Negative for cough, dyspnea, hemoptysis GI: Negative for nausea, vomiting, diarrhea, constipation, abdominal pain, melena, hematochezia. GU: Positive for frequent urination with increased fluid intake, negative for dysuria, incontinence, urinary  hesitance, hematuria, vaginal or penile discharge, sexual difficulty, lumps in testicle or breasts MSK: Negative for muscle cramps or aches, joint pain or swelling Neuro: Negative for headaches, weakness, numbness, dizziness, passing out/fainting Psych: Negative for depression, anxiety, memory problems  Objective  Physical Exam Vitals:   03/30/19 0859  BP: 120/80  Pulse: 65  Temp: 98.1 F (36.7 C)  SpO2: 96%    BP Readings from Last 3 Encounters:  03/30/19 120/80  06/20/18 135/85  05/21/18 137/80   Wt Readings from Last 3 Encounters:  03/30/19 203 lb 9.6 oz (92.4 kg)  06/20/18 200 lb 12.8 oz (91.1 kg)  05/21/18 196 lb 14.4 oz (89.3 kg)    Physical Exam Constitutional:      General: He is not in acute distress.    Appearance: He is not diaphoretic.  HENT:     Head: Normocephalic and atraumatic.  Eyes:     Conjunctiva/sclera: Conjunctivae normal.     Pupils: Pupils are equal, round, and reactive to light.  Cardiovascular:     Rate and Rhythm: Normal rate and regular rhythm.     Heart sounds: Normal heart sounds.  Pulmonary:     Effort: Pulmonary effort is normal.     Breath sounds: Normal breath sounds.  Abdominal:     General: Bowel sounds are normal. There is no distension.     Palpations: Abdomen is soft.     Tenderness: There is no abdominal tenderness. There is no guarding or rebound.  Musculoskeletal:     Right lower leg: No edema.     Left lower leg: No edema.  Lymphadenopathy:     Cervical: No cervical adenopathy.  Skin:    General: Skin is warm and dry.  Neurological:     Mental Status: He is alert.  Psychiatric:        Mood and Affect: Mood normal.      Assessment/Plan:   Routine general medical examination at a health care facility Physical exam completed.  Encouraged continued diet and exercise.  We will check a PSA and for the results to his urologist.  HIV screening to be completed.  GI referral placed for colonoscopy.  He will check with  his insurance regarding Shingrix.  Lab work as outlined below.   Orders Placed This Encounter  Procedures  . Flu Vaccine QUAD 36+ mos IM  . Comp Met (CMET)  . Lipid panel  . PSA  . HIV antibody (with reflex)  . CBC  . HgB A1c  . Ambulatory referral to Gastroenterology    Referral Priority:   Routine    Referral Type:   Consultation    Referral Reason:   Specialty Services Required    Number of Visits Requested:   1  No orders of the defined types were placed in this encounter.    Tommi Rumps, MD [place]

## 2019-03-30 NOTE — Assessment & Plan Note (Signed)
Physical exam completed.  Encouraged continued diet and exercise.  We will check a PSA and for the results to his urologist.  HIV screening to be completed.  GI referral placed for colonoscopy.  He will check with his insurance regarding Shingrix.  Lab work as outlined below.

## 2019-03-30 NOTE — Patient Instructions (Signed)
Nice to see you. We will check lab work today and contact you with the results. Please check with your insurance company regarding Shingrix.  If you would like to get this done in our office please let us know. We will get you referred to GI for colonoscopy. Please continue to work on dietary changes and exercise.

## 2019-03-31 LAB — HIV ANTIBODY (ROUTINE TESTING W REFLEX): HIV 1&2 Ab, 4th Generation: NONREACTIVE

## 2019-04-10 ENCOUNTER — Encounter: Payer: Self-pay | Admitting: *Deleted

## 2019-06-06 ENCOUNTER — Other Ambulatory Visit: Payer: Self-pay | Admitting: Family Medicine

## 2019-06-06 DIAGNOSIS — I10 Essential (primary) hypertension: Secondary | ICD-10-CM

## 2019-09-28 ENCOUNTER — Ambulatory Visit: Payer: BC Managed Care – PPO | Admitting: Family Medicine

## 2019-10-03 ENCOUNTER — Other Ambulatory Visit: Payer: Self-pay | Admitting: Urology

## 2019-10-03 DIAGNOSIS — R972 Elevated prostate specific antigen [PSA]: Secondary | ICD-10-CM

## 2019-10-03 DIAGNOSIS — N401 Enlarged prostate with lower urinary tract symptoms: Secondary | ICD-10-CM

## 2019-10-05 DIAGNOSIS — Z23 Encounter for immunization: Secondary | ICD-10-CM | POA: Diagnosis not present

## 2019-10-16 ENCOUNTER — Other Ambulatory Visit: Payer: Self-pay | Admitting: Urology

## 2019-10-16 DIAGNOSIS — N401 Enlarged prostate with lower urinary tract symptoms: Secondary | ICD-10-CM

## 2019-10-16 DIAGNOSIS — R972 Elevated prostate specific antigen [PSA]: Secondary | ICD-10-CM

## 2019-10-16 DIAGNOSIS — R350 Frequency of micturition: Secondary | ICD-10-CM

## 2019-10-20 ENCOUNTER — Other Ambulatory Visit: Payer: Self-pay | Admitting: Urology

## 2019-10-20 DIAGNOSIS — N401 Enlarged prostate with lower urinary tract symptoms: Secondary | ICD-10-CM

## 2019-10-20 DIAGNOSIS — R972 Elevated prostate specific antigen [PSA]: Secondary | ICD-10-CM

## 2019-11-02 DIAGNOSIS — Z23 Encounter for immunization: Secondary | ICD-10-CM | POA: Diagnosis not present

## 2019-12-08 ENCOUNTER — Other Ambulatory Visit: Payer: Self-pay | Admitting: Family Medicine

## 2019-12-08 DIAGNOSIS — I10 Essential (primary) hypertension: Secondary | ICD-10-CM

## 2020-04-11 DIAGNOSIS — M7542 Impingement syndrome of left shoulder: Secondary | ICD-10-CM | POA: Diagnosis not present

## 2020-06-20 ENCOUNTER — Other Ambulatory Visit: Payer: Self-pay | Admitting: Family Medicine

## 2020-06-20 DIAGNOSIS — I10 Essential (primary) hypertension: Secondary | ICD-10-CM

## 2020-07-18 ENCOUNTER — Other Ambulatory Visit: Payer: Self-pay | Admitting: Family Medicine

## 2020-07-18 DIAGNOSIS — I10 Essential (primary) hypertension: Secondary | ICD-10-CM

## 2021-09-29 DIAGNOSIS — R059 Cough, unspecified: Secondary | ICD-10-CM | POA: Diagnosis not present

## 2021-09-29 DIAGNOSIS — Z20828 Contact with and (suspected) exposure to other viral communicable diseases: Secondary | ICD-10-CM | POA: Diagnosis not present

## 2021-09-29 DIAGNOSIS — J069 Acute upper respiratory infection, unspecified: Secondary | ICD-10-CM | POA: Diagnosis not present

## 2021-09-29 DIAGNOSIS — R509 Fever, unspecified: Secondary | ICD-10-CM | POA: Diagnosis not present

## 2022-04-16 ENCOUNTER — Encounter: Payer: Self-pay | Admitting: Family Medicine

## 2022-04-16 ENCOUNTER — Ambulatory Visit (INDEPENDENT_AMBULATORY_CARE_PROVIDER_SITE_OTHER): Payer: BLUE CROSS/BLUE SHIELD | Admitting: Family Medicine

## 2022-04-16 VITALS — BP 110/70 | HR 90 | Temp 99.1°F | Ht 68.0 in | Wt 200.0 lb

## 2022-04-16 DIAGNOSIS — Z0000 Encounter for general adult medical examination without abnormal findings: Secondary | ICD-10-CM | POA: Diagnosis not present

## 2022-04-16 DIAGNOSIS — D582 Other hemoglobinopathies: Secondary | ICD-10-CM | POA: Diagnosis not present

## 2022-04-16 DIAGNOSIS — Z114 Encounter for screening for human immunodeficiency virus [HIV]: Secondary | ICD-10-CM | POA: Diagnosis not present

## 2022-04-16 DIAGNOSIS — Z6830 Body mass index (BMI) 30.0-30.9, adult: Secondary | ICD-10-CM

## 2022-04-16 DIAGNOSIS — Z23 Encounter for immunization: Secondary | ICD-10-CM | POA: Diagnosis not present

## 2022-04-16 DIAGNOSIS — I10 Essential (primary) hypertension: Secondary | ICD-10-CM

## 2022-04-16 DIAGNOSIS — E6609 Other obesity due to excess calories: Secondary | ICD-10-CM | POA: Diagnosis not present

## 2022-04-16 DIAGNOSIS — Z13220 Encounter for screening for lipoid disorders: Secondary | ICD-10-CM

## 2022-04-16 DIAGNOSIS — Z1211 Encounter for screening for malignant neoplasm of colon: Secondary | ICD-10-CM

## 2022-04-16 DIAGNOSIS — Z125 Encounter for screening for malignant neoplasm of prostate: Secondary | ICD-10-CM

## 2022-04-16 LAB — CBC
HCT: 49.2 % (ref 39.0–52.0)
Hemoglobin: 16.6 g/dL (ref 13.0–17.0)
MCHC: 33.7 g/dL (ref 30.0–36.0)
MCV: 89.7 fl (ref 78.0–100.0)
Platelets: 171 10*3/uL (ref 150.0–400.0)
RBC: 5.49 Mil/uL (ref 4.22–5.81)
RDW: 13.4 % (ref 11.5–15.5)
WBC: 6.7 10*3/uL (ref 4.0–10.5)

## 2022-04-16 LAB — LIPID PANEL
Cholesterol: 166 mg/dL (ref 0–200)
HDL: 35.3 mg/dL — ABNORMAL LOW (ref >39.00)
LDL Cholesterol: 117 mg/dL — ABNORMAL HIGH (ref 0–99)
NonHDL: 130.36
Total CHOL/HDL Ratio: 5
Triglycerides: 68 mg/dL (ref 0.0–149.0)
VLDL: 13.6 mg/dL (ref 0.0–40.0)

## 2022-04-16 LAB — HEMOGLOBIN A1C: Hgb A1c MFr Bld: 5.8 % (ref 4.6–6.5)

## 2022-04-16 LAB — COMPREHENSIVE METABOLIC PANEL
ALT: 47 U/L (ref 0–53)
AST: 29 U/L (ref 0–37)
Albumin: 4.6 g/dL (ref 3.5–5.2)
Alkaline Phosphatase: 59 U/L (ref 39–117)
BUN: 22 mg/dL (ref 6–23)
CO2: 24 mEq/L (ref 19–32)
Calcium: 9.4 mg/dL (ref 8.4–10.5)
Chloride: 106 mEq/L (ref 96–112)
Creatinine, Ser: 1.22 mg/dL (ref 0.40–1.50)
GFR: 65.06 mL/min (ref >60.00)
Glucose, Bld: 81 mg/dL (ref 70–99)
Potassium: 4 mEq/L (ref 3.5–5.1)
Sodium: 142 mEq/L (ref 135–145)
Total Bilirubin: 0.8 mg/dL (ref 0.2–1.2)
Total Protein: 7.5 g/dL (ref 6.0–8.3)

## 2022-04-16 LAB — PSA: PSA: 10.19 ng/mL — ABNORMAL HIGH (ref 0.10–4.00)

## 2022-04-16 NOTE — Assessment & Plan Note (Signed)
Physical exam completed.  Encouraged healthier diet with reduction in fried food intake.  Discussed exercising more.  Advised to reduce his salt intake as well.  I advised him to stop eating pork with regards to the headaches he describes with eating pork.  Encouraged him to get the Shingrix vaccine.  He will check with his insurance regarding coverage.  Discussed binge drinking and reducing his alcohol intake when he does drink alcohol.  Referral to GI for colonoscopy.  Lab work as outlined.

## 2022-04-16 NOTE — Progress Notes (Signed)
Tommi Rumps, MD Phone: [phone]  Caleb Avila is a 61 y.o. male who presents today for CPE.  Diet: some fruits and vegetables, less sweets, cut down on sodas, fried food 2x/week Exercise: walks Saturday/Sunday 4 miles Colonoscopy: due Prostate cancer screening: due Family history-  Prostate cancer: no  Colon cancer: no Vaccines-   Flu: due  Tetanus: UTD  Shingles: due  COVID19: x3 HIV screening: UTD Hep C Screening: UTD Tobacco use: no Alcohol use: occasionally on vacation, he will have 2 shots of tequila and 2 [place] ice teas Illicit Drug use: no Dentist: yes Ophthalmology: yes Notes a mild headache when he eats pork. Notes his BP was in the 063K systolic a few weeks ago at work.    Active Ambulatory Problems    Diagnosis Date Noted   Routine general medical examination at a health care facility 11/04/2015   Essential hypertension 11/04/2015   Constipation 11/04/2015   Heat intolerance 11/04/2015   Cutaneous skin tags 12/07/2015   Elevated PSA 05/08/2017   Benign prostatic hyperplasia with urinary frequency 05/08/2017   Obesity 10/15/2017   History of sexually transmitted disease 03/28/2018   Proteinuria 03/28/2018   Resolved Ambulatory Problems    Diagnosis Date Noted   CAP (community acquired pneumonia) 01/07/2017   Sepsis ([place]) 01/07/2017   Multifocal pneumonia 01/07/2017   Acute respiratory failure with hypoxia ([facility]) 01/07/2017   SOB (shortness of breath)    Pleural effusion    Empyema ([facility])    Hypoxemia    Empyema lung ([place]) 01/13/2017   Past Medical History:  Diagnosis Date   Depression    Frequent headaches    Hypertension     Family History  Problem Relation Age of Onset   Depression Other     Social History   Socioeconomic History   Marital status: Single    Spouse name: Not on file   Number of children: Not on file   Years of education: Not on file   Highest education level: Not on file  Occupational History    Not on file  Tobacco Use   Smoking status: Never   Smokeless tobacco: Never  Vaping Use   Vaping Use: Never used  Substance and Sexual Activity   Alcohol use: No    Alcohol/week: 0.0 standard drinks of alcohol   Drug use: No   Sexual activity: Yes    Birth control/protection: None  Other Topics Concern   Not on file  Social History Narrative   Not on file   Social Determinants of Health   Financial Resource Strain: Not on file  Food Insecurity: Not on file  Transportation Needs: Not on file  Physical Activity: Not on file  Stress: Not on file  Social Connections: Not on file  Intimate Partner Violence: Not on file    ROS  General:  Negative for nexplained weight loss, fever Skin: Negative for new or changing mole, sore that won't heal HEENT: Negative for trouble hearing, trouble seeing, ringing in ears, mouth sores, hoarseness, change in voice, dysphagia. CV:  Negative for chest pain, dyspnea, edema, palpitations Resp: Negative for cough, dyspnea, hemoptysis GI: Negative for nausea, vomiting, diarrhea, constipation, abdominal pain, melena, hematochezia. GU: Negative for dysuria, incontinence, urinary hesitance, hematuria, vaginal or penile discharge, polyuria, sexual difficulty, lumps in testicle or breasts MSK: Negative for muscle cramps or aches, joint pain or swelling Neuro: Negative for headaches, weakness, numbness, dizziness, passing out/fainting Psych: Negative for depression, anxiety, memory problems  Objective  Physical  Exam Vitals:   04/16/22 1134  BP: 110/70  Pulse: 90  Temp: 99.1 F (37.3 C)  SpO2: 97%    BP Readings from Last 3 Encounters:  04/16/22 110/70  03/30/19 120/80  06/20/18 135/85   Wt Readings from Last 3 Encounters:  04/16/22 200 lb (90.7 kg)  03/30/19 203 lb 9.6 oz (92.4 kg)  06/20/18 200 lb 12.8 oz (91.1 kg)    Physical Exam Constitutional:      General: He is not in acute distress.    Appearance: He is not diaphoretic.   HENT:     Head: Normocephalic and atraumatic.  Cardiovascular:     Rate and Rhythm: Normal rate and regular rhythm.     Heart sounds: Normal heart sounds.  Pulmonary:     Effort: Pulmonary effort is normal.     Breath sounds: Normal breath sounds.  Abdominal:     General: Bowel sounds are normal. There is no distension.     Palpations: Abdomen is soft.     Tenderness: There is no abdominal tenderness.  Musculoskeletal:     Right lower leg: No edema.     Left lower leg: No edema.  Lymphadenopathy:     Cervical: No cervical adenopathy.  Skin:    General: Skin is warm and dry.  Neurological:     Mental Status: He is alert.  Psychiatric:        Mood and Affect: Mood normal.      Assessment/Plan:   Problem List Items Addressed This Visit     Essential hypertension   Obesity   Relevant Orders   HgB A1c   Routine general medical examination at a health care facility - Primary    Physical exam completed.  Encouraged healthier diet with reduction in fried food intake.  Discussed exercising more.  Advised to reduce his salt intake as well.  I advised him to stop eating pork with regards to the headaches he describes with eating pork.  Encouraged him to get the Shingrix vaccine.  He will check with his insurance regarding coverage.  Discussed binge drinking and reducing his alcohol intake when he does drink alcohol.  Referral to GI for colonoscopy.  Lab work as outlined.      Other Visit Diagnoses     Need for immunization against influenza       Relevant Orders   Flu Vaccine QUAD 38moIM (Fluarix, Fluzone & Alfiuria Quad PF) (Completed)   Elevated hemoglobin ([facility])       Relevant Orders   CBC   Lipid screening       Relevant Orders   Comp Met (CMET)   Lipid panel   Encounter for screening for HIV       Relevant Orders   HIV antibody (with reflex)   Prostate cancer screening       Relevant Orders   PSA   Colon cancer screening       Relevant Orders   Ambulatory  referral to Gastroenterology       Return in about 3 months (around 07/17/2022) for HTN.   ETommi Rumps MD [facility]

## 2022-04-16 NOTE — Patient Instructions (Signed)
Nice to see you. Please refrain from eating pork if it gives you a headache. Please increase your exercise. Please reduce your fried food intake. Please check with your insurance regarding coverage for the Shingrix (shingles) vaccine. GI should contact you to schedule your colonoscopy.

## 2022-04-17 ENCOUNTER — Other Ambulatory Visit: Payer: Self-pay

## 2022-04-17 DIAGNOSIS — E785 Hyperlipidemia, unspecified: Secondary | ICD-10-CM

## 2022-04-17 DIAGNOSIS — R7401 Elevation of levels of liver transaminase levels: Secondary | ICD-10-CM

## 2022-04-17 LAB — HIV ANTIBODY (ROUTINE TESTING W REFLEX): HIV 1&2 Ab, 4th Generation: NONREACTIVE

## 2022-04-17 MED ORDER — AMLODIPINE BESYLATE 5 MG PO TABS
ORAL_TABLET | ORAL | 3 refills | Status: DC
Start: 2022-04-17 — End: 2022-05-03

## 2022-04-17 MED ORDER — ROSUVASTATIN CALCIUM 20 MG PO TABS: 20.0000 mg | ORAL_TABLET | Freq: Every day | ORAL | 1 refills | Status: DC

## 2022-04-17 NOTE — Addendum Note (Signed)
Addended by: Caryl Bis, Imogen Maddalena G on: 04/17/2022 01:03 PM   Modules accepted: Orders

## 2022-04-18 ENCOUNTER — Other Ambulatory Visit: Payer: Self-pay | Admitting: Family Medicine

## 2022-04-18 DIAGNOSIS — R972 Elevated prostate specific antigen [PSA]: Secondary | ICD-10-CM

## 2022-04-20 ENCOUNTER — Telehealth: Payer: Self-pay

## 2022-04-20 ENCOUNTER — Other Ambulatory Visit: Payer: Self-pay

## 2022-04-20 DIAGNOSIS — I10 Essential (primary) hypertension: Secondary | ICD-10-CM

## 2022-04-20 NOTE — Telephone Encounter (Signed)
I received a prescription request for Amlodipine from [facility] the medication was sent to [facility] on 04/17/2022 I was calling the patient to get confirmation of this. Prakriti Carignan,cma

## 2022-05-03 ENCOUNTER — Other Ambulatory Visit: Payer: Self-pay

## 2022-05-03 DIAGNOSIS — I10 Essential (primary) hypertension: Secondary | ICD-10-CM

## 2022-05-03 MED ORDER — AMLODIPINE BESYLATE 5 MG PO TABS: ORAL_TABLET | ORAL | 3 refills | Status: DC

## 2022-05-03 NOTE — Progress Notes (Signed)
Patient sent in a request to chang this to his mail order due to insurance. Medicaiton sent to [facility].   Ulah Olmo,cma

## 2022-05-28 ENCOUNTER — Other Ambulatory Visit: Payer: BLUE CROSS/BLUE SHIELD

## 2022-06-19 ENCOUNTER — Ambulatory Visit: Payer: BLUE CROSS/BLUE SHIELD | Admitting: Family Medicine

## 2022-06-19 ENCOUNTER — Encounter: Payer: Self-pay | Admitting: Family Medicine

## 2022-06-19 ENCOUNTER — Other Ambulatory Visit (INDEPENDENT_AMBULATORY_CARE_PROVIDER_SITE_OTHER): Payer: BLUE CROSS/BLUE SHIELD

## 2022-06-19 ENCOUNTER — Telehealth: Payer: Self-pay | Admitting: Family Medicine

## 2022-06-19 VITALS — BP 126/78 | HR 99 | Temp 97.7°F | Ht 68.0 in | Wt 205.2 lb

## 2022-06-19 DIAGNOSIS — R7401 Elevation of levels of liver transaminase levels: Secondary | ICD-10-CM | POA: Diagnosis not present

## 2022-06-19 DIAGNOSIS — S29012A Strain of muscle and tendon of back wall of thorax, initial encounter: Secondary | ICD-10-CM

## 2022-06-19 DIAGNOSIS — E785 Hyperlipidemia, unspecified: Secondary | ICD-10-CM

## 2022-06-19 DIAGNOSIS — T148XXA Other injury of unspecified body region, initial encounter: Secondary | ICD-10-CM | POA: Insufficient documentation

## 2022-06-19 LAB — HEPATIC FUNCTION PANEL
ALT: 59 U/L — ABNORMAL HIGH (ref 0–53)
AST: 36 U/L (ref 0–37)
Albumin: 4.5 g/dL (ref 3.5–5.2)
Alkaline Phosphatase: 64 U/L (ref 39–117)
Bilirubin, Direct: 0.1 mg/dL (ref 0.0–0.3)
Total Bilirubin: 0.6 mg/dL (ref 0.2–1.2)
Total Protein: 7.5 g/dL (ref 6.0–8.3)

## 2022-06-19 LAB — LDL CHOLESTEROL, DIRECT: Direct LDL: 50 mg/dL

## 2022-06-19 MED ORDER — PANTOPRAZOLE SODIUM 40 MG PO TBEC: 40.0000 mg | DELAYED_RELEASE_TABLET | Freq: Every day | ORAL | 0 refills | Status: DC

## 2022-06-19 MED ORDER — METHOCARBAMOL 500 MG PO TABS: 500.0000 mg | ORAL_TABLET | Freq: Four times a day (QID) | ORAL | 0 refills | Status: AC

## 2022-06-19 MED ORDER — CELECOXIB 100 MG PO CAPS: 100.0000 mg | ORAL_CAPSULE | Freq: Two times a day (BID) | ORAL | 0 refills | Status: DC

## 2022-06-19 NOTE — Telephone Encounter (Signed)
Spoke to Patient to let him know that Dr. Volanda Napoleon recommends the Pantoprazole while taking the Celebrex. Patient verbalized understanding and is agreeable.

## 2022-06-19 NOTE — Telephone Encounter (Signed)
Patient called and stated he saw Dr Volanda Napoleon today for a pinch nerve. He wanted to know why she called in pantoprazole (PROTONIX) 40 MG tablet. He states he does not have acid reflux issues.

## 2022-06-19 NOTE — Assessment & Plan Note (Signed)
Acute.  Unknown etiology.  Spurling's test, Tinel's test, Phalen's test negative.  Tenderness noted over posterior right scapular area.  No vertebral point tenderness.  No red flags.  Start Celebrex 100 mg twice daily x 2 weeks, if improving can decrease to as needed Start Protonix 40 mg daily while on NSAID Avoid ibuprofen NSAID containing products Robaxin 500 mg 4 times daily.  Advised not to use while working with heavy equipment. Tylenol 500 mg every 6 hours as needed Continue heat and ice as needed Recommend thoracic exercises to help with maintaining good posture Limit repetitive motion until symptoms improve Offered physical therapy, patient politely declined. If no improvement in 2 to 4 weeks can consider imaging.

## 2022-06-19 NOTE — Patient Instructions (Addendum)
It was a pleasure meeting you today. Thank you for allowing me to take part in your health care.  Our goals for today as we discussed include:  For your right upper back pain Start Celebrex 100 mg two times a day for 14 days, then can take as needed if improving Start Protonix 40 mg daily while on Celebrex Start Robaxin 500 mg at 4 times a day.  Do not take this while working with heavy equipment. Can take Tylenol 500 mg every 6 hours Do not take any Ibuprofen, Aleve or Motrin Continue heat/ice as needed Can use Biofreeze 3-4 times daily Can use Lidocaine patch  Limit repetitive movement Gentle upper back exercises Can send referral to PT if you would like a more structured approach.  Please MyChart if this is something that you would like sent  If not improving or if any weakness please notify MD.  If you have any questions or concerns, please do not hesitate to call the office at [phone].  I look forward to our next visit and until then take care and stay safe.  Regards,   Carollee Leitz, MD   [facility]

## 2022-06-19 NOTE — Progress Notes (Signed)
SUBJECTIVE:   Chief Complaint  Patient presents with   Acute Visit    Left shoulder to left elbow pain x 1 week   HPI Patient presents to clinic for aggravating muscle pain and right upper back.  Reports woke up 1 week ago with pain and numbness going down right scapular area and down to right elbow.  Patient reports he tried heat and ice.  Reports heat has helped relieve some symptoms.  Has taken ibuprofen, Advil and Tylenol with no relief.  Denies any fevers, chest pain, shortness of breath, nausea/vomiting, diaphoresis, trauma, weakness, decreased range of motion in upper extremities.  Endorses more aggravated feeling of something sticking in.  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  Predominantly left-handed.  PERTINENT PMH / PSH: Hypertension DJD with slight posterior spondylosis L5-S1  OBJECTIVE:  BP 126/78   Pulse 99   Temp 97.7 F (36.5 C)   Ht 5\' 8"  (1.727 m)   Wt 205 lb 3.2 oz (93.1 kg)   SpO2 99%   BMI 31.20 kg/m    Physical Exam Constitutional:      General: He is not in acute distress.    Appearance: He is normal weight. He is not ill-appearing.  HENT:     Head: Normocephalic.  Eyes:     Conjunctiva/sclera: Conjunctivae normal.  Cardiovascular:     Rate and Rhythm: Normal rate and regular rhythm.     Pulses: Normal pulses.  Pulmonary:     Effort: Pulmonary effort is normal.     Breath sounds: Normal breath sounds.  Abdominal:     General: Bowel sounds are normal.  Musculoskeletal:        General: Tenderness present. No swelling. Normal range of motion.     Right shoulder: Normal.     Left shoulder: Normal.     Right upper arm: Normal.     Left upper arm: Normal.     Right elbow: Normal.     Left elbow: Normal.     Cervical back: Normal range of motion. No swelling, edema, spasms, torticollis, tenderness or bony tenderness. No pain with movement. Normal range of motion.     Thoracic back: Spasms and tenderness  present. No swelling, edema, deformity or bony tenderness. Normal range of motion.     Lumbar back: Normal.  Neurological:     Mental Status: He is alert. Mental status is at baseline.  Psychiatric:        Mood and Affect: Mood normal.        Behavior: Behavior normal.        Thought Content: Thought content normal.        Judgment: Judgment normal.     ASSESSMENT/PLAN:  Muscle strain Assessment & Plan: Acute.  Unknown etiology.  Spurling's test, Tinel's test, Phalen's test negative.  Tenderness noted over posterior right scapular area.  No vertebral point tenderness.  No red flags.  Start Celebrex 100 mg twice daily x 2 weeks, if improving can decrease to as needed Start Protonix 40 mg daily while on NSAID Avoid ibuprofen NSAID containing products Robaxin 500 mg 4 times daily.  Advised not to use while working with heavy equipment. Tylenol 500 mg every 6 hours as needed Continue heat and ice as needed Recommend thoracic exercises to help with maintaining good posture Limit repetitive motion until symptoms improve Offered physical therapy, patient politely declined. If no improvement in 2 to 4 weeks can consider imaging.  Orders: -     Celecoxib; Take 1 capsule (100 mg total) by mouth 2 (two) times daily.  Dispense: 30 capsule; Refill: 0 -     Pantoprazole Sodium; Take 1 tablet (40 mg total) by mouth daily.  Dispense: 30 tablet; Refill: 0 -     Methocarbamol; Take 1 tablet (500 mg total) by mouth 4 (four) times daily for 14 days.  Dispense: 56 tablet; Refill: 0   PDMP reviewed  Return in about 2 weeks (around 07/03/2022) for PCP.  Carollee Leitz, MD

## 2022-06-22 ENCOUNTER — Other Ambulatory Visit: Payer: Self-pay

## 2022-06-22 DIAGNOSIS — R748 Abnormal levels of other serum enzymes: Secondary | ICD-10-CM

## 2022-07-06 ENCOUNTER — Telehealth: Payer: Self-pay

## 2022-07-06 NOTE — Telephone Encounter (Signed)
-----  Message from Leone Haven, MD sent at 06/27/2022 11:24 AM EST ----- Noted.  I would have him stop the Tylenol to see if this level improves off of Tylenol.

## 2022-07-06 NOTE — Telephone Encounter (Signed)
LMTCB regarding Dr. Sonnenberg's message 

## 2022-07-09 NOTE — Telephone Encounter (Signed)
Pt called back returning Marissa call.

## 2022-07-10 ENCOUNTER — Other Ambulatory Visit: Payer: Self-pay

## 2022-07-10 DIAGNOSIS — R748 Abnormal levels of other serum enzymes: Secondary | ICD-10-CM

## 2022-07-10 NOTE — Telephone Encounter (Signed)
See result note

## 2022-07-16 ENCOUNTER — Ambulatory Visit: Payer: BLUE CROSS/BLUE SHIELD | Admitting: Family Medicine

## 2022-07-16 ENCOUNTER — Other Ambulatory Visit: Payer: Self-pay | Admitting: Family Medicine

## 2022-07-16 ENCOUNTER — Encounter: Payer: Self-pay | Admitting: Family Medicine

## 2022-07-16 VITALS — BP 122/72 | HR 77 | Temp 98.0°F | Resp 16 | Ht 68.0 in | Wt 199.0 lb

## 2022-07-16 DIAGNOSIS — I10 Essential (primary) hypertension: Secondary | ICD-10-CM | POA: Diagnosis not present

## 2022-07-16 DIAGNOSIS — E785 Hyperlipidemia, unspecified: Secondary | ICD-10-CM

## 2022-07-16 DIAGNOSIS — K5900 Constipation, unspecified: Secondary | ICD-10-CM | POA: Diagnosis not present

## 2022-07-16 DIAGNOSIS — T148XXA Other injury of unspecified body region, initial encounter: Secondary | ICD-10-CM

## 2022-07-16 NOTE — Assessment & Plan Note (Signed)
Discussed with patient to check his blood pressures at home and monitoring his blood pressure. Continue norvasc 5mg  daily.

## 2022-07-16 NOTE — Progress Notes (Signed)
Caleb Rumps, MD Phone: [phone]  Caleb Avila is a 62 y.o. male who presents today for f/u.  Hypertension: patient does not have a cuff to check his pressures at home. He takes Norvasc 5mg  daily. He denies any chest pain, shortness of breath, or edema.  Constipation: patient reports that he gets constipated in the middle of the week. At work he will typically eat white bread and some soup as he only has a 30-minute lunch break. His mother is currently in the hospital and he tries to spend time with her after work. He finds that he can only eat a bowl of cereal before he has to go to bed for the next day. He will incorporate vegetables into his salad on the weekends. He tries to eat a banana and an orange every day. Patient reports that he has some cramping and has to strain on the toilet sometimes but denies any blood in the stool or diarrhea.   Hyperlipidemia: currently taking Crestor 20mg  daily. Denies any muscle aches or stomach pain.  Social History   Tobacco Use  Smoking Status Never  Smokeless Tobacco Never    Current Outpatient Medications on File Prior to Visit  Medication Sig Dispense Refill   amLODipine (NORVASC) 5 MG tablet TAKE 1 TABLET BY MOUTH EVERY DAY. MUST USE MAIL ORDER FOR MAINT MEDS. 90 tablet 3   aspirin EC 325 MG EC tablet Take 1 tablet (325 mg total) by mouth daily. 30 tablet 0   Multiple Vitamin (MULTIVITAMIN) tablet Take 1 tablet by mouth daily.     rosuvastatin (CRESTOR) 20 MG tablet Take 1 tablet (20 mg total) by mouth daily. 90 tablet 1   celecoxib (CELEBREX) 100 MG capsule Take 1 capsule (100 mg total) by mouth 2 (two) times daily. (Patient not taking: Reported on 07/16/2022) 30 capsule 0   polyethylene glycol powder (GLYCOLAX/MIRALAX) powder Take 17 g by mouth daily as needed for mild constipation. (Patient not taking: Reported on 07/16/2022) 500 g 0   tamsulosin (FLOMAX) 0.4 MG CAPS capsule Take 1 capsule (0.4 mg total) by mouth daily. (Patient  not taking: Reported on 07/16/2022) 90 capsule 3   No current facility-administered medications on file prior to visit.     ROS see history of present illness  Objective  Physical Exam Vitals:   07/16/22 1117  BP: 122/72  Pulse: 77  Resp: 16  Temp: 98 F (36.7 C)  SpO2: 98%    BP Readings from Last 3 Encounters:  07/16/22 122/72  06/19/22 126/78  04/16/22 110/70   Wt Readings from Last 3 Encounters:  07/16/22 199 lb (90.3 kg)  06/19/22 205 lb 3.2 oz (93.1 kg)  04/16/22 200 lb (90.7 kg)    Physical Exam Constitutional:      Appearance: Normal appearance.  HENT:     Head: Normocephalic and atraumatic.  Cardiovascular:     Rate and Rhythm: Normal rate and regular rhythm.  Pulmonary:     Effort: Pulmonary effort is normal.     Breath sounds: Normal breath sounds.  Abdominal:     General: Bowel sounds are normal. There is no distension.     Palpations: Abdomen is soft.     Tenderness: There is no abdominal tenderness.  Skin:    General: Skin is warm and dry.  Neurological:     Mental Status: He is alert.      Assessment/Plan: Please see individual problem list.  Problem List Items Addressed This Visit  Constipation    Advised patient that his constipation is likely from inadequate amount of dietary fiber. Encouraged patient to incorporate high-fiber foods like vegetables into his diet in the middle of the week and to supplement fiber with Metamucil followed by Miralax as necessary.      Essential hypertension - Primary    Discussed with patient to check his blood pressures at home and monitoring his blood pressure. Continue norvasc 5mg  daily.      Hyperlipidemia    Continue Crestor 20mg  daily.      Health maintenance: Patient will call to schedule his colonoscopy.  He notes he has the phone number to do so.  He will check with his insurance regarding coverage of the Shingrix vaccine.  He reports having a tetanus vaccine in 2018 or 2019.  Return in  about 6 months (around 01/14/2023) for Hypertension.   Marisa Cyphers, Medical Student [place]

## 2022-07-16 NOTE — Progress Notes (Signed)
Patient seen along with medical student Zada Zhong.  I personally evaluated this patient along with the student, and verified all aspects of the history, physical exam, and medical decision making as documented by the student.  I agree with the student's documentation and have made all necessary edits.  Pranathi Winfree, MD  

## 2022-07-16 NOTE — Patient Instructions (Signed)
Nice to see you. Please try to add in some extra vegetables or add a fiber supplement to see if that helps with your bowel movements.  If those things are not helpful you can add MiraLAX.

## 2022-07-16 NOTE — Assessment & Plan Note (Signed)
Advised patient that his constipation is likely from inadequate amount of dietary fiber. Encouraged patient to incorporate high-fiber foods like vegetables into his diet in the middle of the week and to supplement fiber with Metamucil followed by Miralax as necessary.

## 2022-07-16 NOTE — Assessment & Plan Note (Signed)
Continue Crestor 20 mg daily. 

## 2022-08-07 ENCOUNTER — Other Ambulatory Visit (INDEPENDENT_AMBULATORY_CARE_PROVIDER_SITE_OTHER): Payer: BLUE CROSS/BLUE SHIELD

## 2022-08-07 DIAGNOSIS — R748 Abnormal levels of other serum enzymes: Secondary | ICD-10-CM | POA: Diagnosis not present

## 2022-08-07 LAB — HEPATIC FUNCTION PANEL
ALT: 38 U/L (ref 0–53)
AST: 26 U/L (ref 0–37)
Albumin: 4.6 g/dL (ref 3.5–5.2)
Alkaline Phosphatase: 67 U/L (ref 39–117)
Bilirubin, Direct: 0.2 mg/dL (ref 0.0–0.3)
Total Bilirubin: 0.7 mg/dL (ref 0.2–1.2)
Total Protein: 7.7 g/dL (ref 6.0–8.3)

## 2022-09-13 ENCOUNTER — Encounter: Payer: Self-pay | Admitting: Family Medicine

## 2022-10-15 ENCOUNTER — Other Ambulatory Visit: Payer: Self-pay | Admitting: Family Medicine

## 2022-10-15 DIAGNOSIS — E785 Hyperlipidemia, unspecified: Secondary | ICD-10-CM

## 2023-01-25 ENCOUNTER — Ambulatory Visit: Payer: BLUE CROSS/BLUE SHIELD | Admitting: Family Medicine

## 2023-04-22 ENCOUNTER — Encounter: Payer: Self-pay | Admitting: Family Medicine

## 2023-04-22 ENCOUNTER — Ambulatory Visit (INDEPENDENT_AMBULATORY_CARE_PROVIDER_SITE_OTHER): Payer: BLUE CROSS/BLUE SHIELD | Admitting: Family Medicine

## 2023-04-22 VITALS — BP 118/80 | HR 78 | Temp 98.6°F | Ht 68.0 in | Wt 189.4 lb

## 2023-04-22 DIAGNOSIS — Z23 Encounter for immunization: Secondary | ICD-10-CM

## 2023-04-22 DIAGNOSIS — E663 Overweight: Secondary | ICD-10-CM

## 2023-04-22 DIAGNOSIS — Z0000 Encounter for general adult medical examination without abnormal findings: Secondary | ICD-10-CM

## 2023-04-22 DIAGNOSIS — E785 Hyperlipidemia, unspecified: Secondary | ICD-10-CM | POA: Diagnosis not present

## 2023-04-22 DIAGNOSIS — Z1211 Encounter for screening for malignant neoplasm of colon: Secondary | ICD-10-CM

## 2023-04-22 DIAGNOSIS — R972 Elevated prostate specific antigen [PSA]: Secondary | ICD-10-CM

## 2023-04-22 DIAGNOSIS — Z114 Encounter for screening for human immunodeficiency virus [HIV]: Secondary | ICD-10-CM | POA: Diagnosis not present

## 2023-04-22 LAB — COMPREHENSIVE METABOLIC PANEL
ALT: 23 U/L (ref 0–53)
AST: 19 U/L (ref 0–37)
Albumin: 4.5 g/dL (ref 3.5–5.2)
Alkaline Phosphatase: 64 U/L (ref 39–117)
BUN: 21 mg/dL (ref 6–23)
CO2: 28 meq/L (ref 19–32)
Calcium: 9.7 mg/dL (ref 8.4–10.5)
Chloride: 107 meq/L (ref 96–112)
Creatinine, Ser: 1.1 mg/dL (ref 0.40–1.50)
GFR: 73.14 mL/min (ref >60.00)
Glucose, Bld: 102 mg/dL — ABNORMAL HIGH (ref 70–99)
Potassium: 4.2 meq/L (ref 3.5–5.1)
Sodium: 143 meq/L (ref 135–145)
Total Bilirubin: 0.5 mg/dL (ref 0.2–1.2)
Total Protein: 7.5 g/dL (ref 6.0–8.3)

## 2023-04-22 LAB — LIPID PANEL
Cholesterol: 98 mg/dL (ref 0–200)
HDL: 39.5 mg/dL (ref >39.00)
LDL Cholesterol: 46 mg/dL (ref 0–99)
NonHDL: 58.85
Total CHOL/HDL Ratio: 2
Triglycerides: 64 mg/dL (ref 0.0–149.0)
VLDL: 12.8 mg/dL (ref 0.0–40.0)

## 2023-04-22 LAB — HEMOGLOBIN A1C: Hgb A1c MFr Bld: 5.6 % (ref 4.6–6.5)

## 2023-04-22 LAB — PSA: PSA: 13.33 ng/mL — ABNORMAL HIGH (ref 0.10–4.00)

## 2023-04-22 NOTE — Assessment & Plan Note (Signed)
Patient was referred to urology last year though it appears they were never able to get in touch with him.  We are will recheck PSA today.  Advised if it was elevated in any degree we would refer back to urology.  Advised that he needs to be on the look out for their call if we do place that referral.

## 2023-04-22 NOTE — Progress Notes (Signed)
Marikay Alar, MD Phone: [phone]  Caleb Avila is a 62 y.o. male who presents today for CPE.  Diet: Patient gave up soda, gets fruits and vegetables, not many fried foods, not as many sweets as he used to Exercise: Walking Colonoscopy: Due Prostate cancer screening: Due Family history-  Prostate cancer: No  Colon cancer: No Vaccines-   Flu: Due  Tetanus: Reports had in 2019  Shingles: Due  COVID19: Due for updated vaccine  RSV: Due HIV screening: Up-to-date Hep C Screening: Up-to-date Tobacco use: no Alcohol use: rare Illicit Drug use: no Dentist: yes Ophthalmology: yes   Active Ambulatory Problems    Diagnosis Date Noted   Routine general medical examination at a health care facility 11/04/2015   Essential hypertension 11/04/2015   Constipation 11/04/2015   Heat intolerance 11/04/2015   Cutaneous skin tags 12/07/2015   Elevated PSA 05/08/2017   Benign prostatic hyperplasia with urinary frequency 05/08/2017   Overweight 10/15/2017   History of sexually transmitted disease 03/28/2018   Proteinuria 03/28/2018   Muscle strain 06/19/2022   Hyperlipidemia 07/16/2022   Resolved Ambulatory Problems    Diagnosis Date Noted   CAP (community acquired pneumonia) 01/07/2017   Sepsis (HCC) 01/07/2017   Multifocal pneumonia 01/07/2017   Acute respiratory failure with hypoxia (HCC) 01/07/2017   SOB (shortness of breath)    Pleural effusion    Empyema (HCC)    Hypoxemia    Empyema lung (HCC) 01/13/2017   Past Medical History:  Diagnosis Date   Depression    Frequent headaches    Hypertension     Family History  Problem Relation Age of Onset   Depression Other     Social History   Socioeconomic History   Marital status: Single    Spouse name: Not on file   Number of children: Not on file   Years of education: Not on file   Highest education level: Not on file  Occupational History   Not on file  Tobacco Use   Smoking status: Never    Smokeless tobacco: Never  Vaping Use   Vaping status: Never Used  Substance and Sexual Activity   Alcohol use: No    Alcohol/week: 0.0 standard drinks of alcohol   Drug use: No   Sexual activity: Yes    Birth control/protection: None  Other Topics Concern   Not on file  Social History Narrative   Not on file   Social Determinants of Health   Financial Resource Strain: Not on file  Food Insecurity: Not on file  Transportation Needs: Not on file  Physical Activity: Not on file  Stress: Not on file  Social Connections: Not on file  Intimate Partner Violence: Not on file    ROS  General:  Negative for nexplained weight loss, fever Skin: Negative for new or changing mole, sore that won't heal HEENT: Negative for trouble hearing, trouble seeing, ringing in ears, mouth sores, hoarseness, change in voice, dysphagia. CV:  Negative for chest pain, dyspnea, edema, palpitations Resp: Negative for cough, dyspnea, hemoptysis GI: Positive for constipation, negative for nausea, vomiting, diarrhea, abdominal pain, melena, hematochezia. GU: Positive for urinary frequency related to increased water intake negative for dysuria, incontinence, urinary hesitance, hematuria, vaginal or penile discharge, sexual difficulty, lumps in testicle or breasts MSK: Negative for muscle cramps or aches, joint pain or swelling Neuro: Negative for headaches, weakness, numbness, dizziness, passing out/fainting Psych: Negative for depression, anxiety, memory problems  Objective  Physical Exam Vitals:   04/22/23 2956  BP: 118/80  Pulse: 78  Temp: 98.6 F (37 C)  SpO2: 95%    BP Readings from Last 3 Encounters:  04/22/23 118/80  07/16/22 122/72  06/19/22 126/78   Wt Readings from Last 3 Encounters:  04/22/23 189 lb 6.4 oz (85.9 kg)  07/16/22 199 lb (90.3 kg)  06/19/22 205 lb 3.2 oz (93.1 kg)    Physical Exam Constitutional:      General: He is not in acute distress.    Appearance: He is not  diaphoretic.  HENT:     Head: Normocephalic and atraumatic.  Cardiovascular:     Rate and Rhythm: Normal rate and regular rhythm.     Heart sounds: Normal heart sounds.  Pulmonary:     Effort: Pulmonary effort is normal.     Breath sounds: Normal breath sounds.  Abdominal:     General: Bowel sounds are normal. There is no distension.     Palpations: Abdomen is soft.     Tenderness: There is no abdominal tenderness.  Musculoskeletal:     Right lower leg: No edema.     Left lower leg: No edema.  Skin:    General: Skin is warm and dry.  Neurological:     Mental Status: He is alert.  Psychiatric:        Mood and Affect: Mood normal.      Assessment/Plan:   Routine general medical examination at a health care facility Assessment & Plan: Physical exam completed.  Encouraged healthy diet and exercise.  PSA to be completed today.  Referred to GI for colon cancer screening.  Patient will check with his insurance regarding coverage for the Shingrix vaccine and the RSV vaccine.  He will consider getting the updated COVID vaccination.  Flu vaccine given today.  Lab work as outlined.   Hyperlipidemia, unspecified hyperlipidemia type -     Comprehensive metabolic panel -     Lipid panel  Encounter for screening for HIV -     HIV Antibody (routine testing w rflx)  Overweight -     Hemoglobin A1c  Elevated PSA Assessment & Plan: Patient was referred to urology last year though it appears they were never able to get in touch with him.  We are will recheck PSA today.  Advised if it was elevated in any degree we would refer back to urology.  Advised that he needs to be on the look out for their call if we do place that referral.  Orders: -     PSA  Colon cancer screening -     Ambulatory referral to Gastroenterology  Encounter for administration of vaccine -     Flu vaccine trivalent PF, 6mos and older(Flulaval,Afluria,Fluarix,Fluzone)    Return in about 6 months (around  10/20/2023) for Transfer of care.   Marikay Alar, MD [facility]

## 2023-04-22 NOTE — Assessment & Plan Note (Signed)
Physical exam completed.  Encouraged healthy diet and exercise.  PSA to be completed today.  Referred to GI for colon cancer screening.  Patient will check with his insurance regarding coverage for the Shingrix vaccine and the RSV vaccine.  He will consider getting the updated COVID vaccination.  Flu vaccine given today.  Lab work as outlined.

## 2023-04-23 ENCOUNTER — Telehealth: Payer: Self-pay

## 2023-04-23 ENCOUNTER — Other Ambulatory Visit: Payer: Self-pay | Admitting: Family Medicine

## 2023-04-23 DIAGNOSIS — R972 Elevated prostate specific antigen [PSA]: Secondary | ICD-10-CM

## 2023-04-23 LAB — HIV ANTIBODY (ROUTINE TESTING W REFLEX): HIV 1&2 Ab, 4th Generation: NONREACTIVE

## 2023-04-23 NOTE — Telephone Encounter (Signed)
Left message to call the office back regarding the results message below.

## 2023-04-23 NOTE — Telephone Encounter (Signed)
-----   Message from Marikay Alar sent at 04/23/2023 10:36 AM EST ----- Please let the patient know that his PSA remains elevated.  I am placing another referral to urology.  If he does not hear from them in the next week or 2 he needs to let us know.  This is very important that he has this taken care of.  His cholesterol is well-controlled.  His A1c is in the normal range.  His other labs are acceptable.

## 2023-04-26 ENCOUNTER — Telehealth: Payer: Self-pay | Admitting: Family Medicine

## 2023-04-26 NOTE — Telephone Encounter (Signed)
Patient called back and note was read. 

## 2023-04-26 NOTE — Telephone Encounter (Signed)
Patient called and wanted to know besides the Shingle injection what other vaccine does he need. Ok to leave a message on voice mail.

## 2023-05-01 ENCOUNTER — Encounter: Payer: Self-pay | Admitting: *Deleted

## 2023-05-05 ENCOUNTER — Other Ambulatory Visit: Payer: Self-pay | Admitting: Family Medicine

## 2023-05-05 DIAGNOSIS — I10 Essential (primary) hypertension: Secondary | ICD-10-CM

## 2023-05-13 ENCOUNTER — Ambulatory Visit: Payer: BLUE CROSS/BLUE SHIELD | Admitting: Urology

## 2023-05-13 ENCOUNTER — Encounter: Payer: Self-pay | Admitting: Urology

## 2023-05-13 VITALS — BP 156/83 | HR 92 | Ht 68.0 in | Wt 189.0 lb

## 2023-05-13 DIAGNOSIS — R972 Elevated prostate specific antigen [PSA]: Secondary | ICD-10-CM | POA: Diagnosis not present

## 2023-05-13 DIAGNOSIS — N138 Other obstructive and reflux uropathy: Secondary | ICD-10-CM | POA: Diagnosis not present

## 2023-05-13 DIAGNOSIS — N401 Enlarged prostate with lower urinary tract symptoms: Secondary | ICD-10-CM

## 2023-05-13 LAB — URINALYSIS, ROUTINE W REFLEX MICROSCOPIC
Bilirubin, UA: NEGATIVE
Glucose, UA: NEGATIVE
Ketones, UA: NEGATIVE
Leukocytes,UA: NEGATIVE
Nitrite, UA: NEGATIVE
Protein,UA: NEGATIVE
RBC, UA: NEGATIVE
Specific Gravity, UA: 1.02 (ref 1.005–1.030)
Urobilinogen, Ur: 0.2 mg/dL (ref 0.2–1.0)
pH, UA: 6 (ref 5.0–7.5)

## 2023-05-13 NOTE — Progress Notes (Signed)
Assessment: 1. Elevated PSA   2. BPH with obstruction/lower urinary tract symptoms     Plan: I personally reviewed the patient's chart including provider notes, and lab results. Today I had a long discussion with the patient regarding PSA and the rationale and controversies of prostate cancer early detection.  I discussed the pros and cons of further evaluation including TRUS and prostate Bx.  Potential adverse events and complications as well as standard instructions were given.  Patient expressed his understanding of these issues. I discussed the role of MRI in diagnosis and staging of prostate cancer. He would like to proceed with TRUS/BX   Chief Complaint:  Chief Complaint  Patient presents with   Elevated PSA    History of Present Illness:  Caleb Avila is a 62 y.o. male who is seen in consultation from Glori Luis, MD for evaluation of elevated PSA. He has previously been seen by Dr. Lonna Cobb in [facility] for evaluation of elevated PSA with his last visit in January 2020. PSA results:  6/17 3.08 11/18 5.4 10/19 7.99 12/19 7.0 10/20 6.72 10/23 10.19 11/24 13.33  He has not been evaluated with a prostate biopsy.  Attempts were made at obtaining a PSA in 2020 but insurance denied the imaging study.  Has a history of BPH with lower urinary tract symptoms.  He has been managed with tamsulosin 0.4 mg daily. He is no longer taking tamsulosin.  He has some urgency, frequency, weak stream, and sensation of incomplete emptying.  No dysuria or gross hematuria. IPSS = 14 QOL = 2 today.   Past Medical History:  Past Medical History:  Diagnosis Date   Depression    Empyema lung (HCC) 01/13/2017   Frequent headaches    Hypertension    Multifocal pneumonia 01/07/2017    Past Surgical History:  Past Surgical History:  Procedure Laterality Date   chest wall cyst     FINGER SURGERY Right    VIDEO ASSISTED THORACOSCOPY (VATS)/EMPYEMA Left 01/13/2017   Procedure:  VIDEO ASSISTED THORACOSCOPY (VATS) DRAIN EMPYEMA;  Surgeon: Kerin Perna, MD;  Location: [facility] OR;  Service: Thoracic;  Laterality: Left;    Allergies:  No Known Allergies  Family History:  Family History  Problem Relation Age of Onset   Depression Other     Social History:  Social History   Tobacco Use   Smoking status: Never   Smokeless tobacco: Never  Vaping Use   Vaping status: Never Used  Substance Use Topics   Alcohol use: No    Alcohol/week: 0.0 standard drinks of alcohol   Drug use: No    Review of symptoms:  Constitutional:  Negative for unexplained weight loss, night sweats, fever, chills ENT:  Negative for nose bleeds, sinus pain, painful swallowing CV:  Negative for chest pain, shortness of breath, exercise intolerance, palpitations, loss of consciousness Resp:  Negative for cough, wheezing, shortness of breath GI:  Negative for nausea, vomiting, diarrhea, bloody stools GU:  Positives noted in HPI; otherwise negative for gross hematuria, dysuria, urinary incontinence Neuro:  Negative for seizures, poor balance, limb weakness, slurred speech Psych:  Negative for lack of energy, depression, anxiety Endocrine:  Negative for polydipsia, polyuria, symptoms of hypoglycemia (dizziness, hunger, sweating) Hematologic:  Negative for anemia, purpura, petechia, prolonged or excessive bleeding, use of anticoagulants  Allergic:  Negative for difficulty breathing or choking as a result of exposure to anything; no shellfish allergy; no allergic response (rash/itch) to materials, foods  Physical exam: BP [name])  156/83   Pulse 92   Ht 5\' 8"  (1.727 m)   Wt 189 lb (85.7 kg)   BMI 28.74 kg/m  GENERAL APPEARANCE:  Well appearing, well developed, well nourished, NAD HEENT: Atraumatic, Normocephalic, oropharynx clear. NECK: Supple without lymphadenopathy or thyromegaly. LUNGS: Clear to auscultation bilaterally. HEART: Regular Rate and Rhythm without murmurs, gallops, or  rubs. ABDOMEN: Soft, non-tender, No Masses. EXTREMITIES: Moves all extremities well.  Without clubbing, cyanosis, or edema. NEUROLOGIC:  Alert and oriented x 3, normal gait, CN II-XII grossly intact.  MENTAL STATUS:  Appropriate. BACK:  Non-tender to palpation.  No CVAT SKIN:  Warm, dry and intact.   GU: Penis:  circumcised Meatus: Normal Scrotum: normal, no masses Testis: normal without masses bilateral Prostate: 50 g, NT, ? Nodule left midgland Rectum: Normal tone,  no masses or tenderness   Results: U/A: negative

## 2023-05-14 ENCOUNTER — Other Ambulatory Visit: Payer: Self-pay | Admitting: Family Medicine

## 2023-05-14 DIAGNOSIS — E785 Hyperlipidemia, unspecified: Secondary | ICD-10-CM

## 2023-05-14 DIAGNOSIS — I10 Essential (primary) hypertension: Secondary | ICD-10-CM

## 2023-05-15 ENCOUNTER — Other Ambulatory Visit: Payer: Self-pay

## 2023-05-15 DIAGNOSIS — E785 Hyperlipidemia, unspecified: Secondary | ICD-10-CM

## 2023-05-15 MED ORDER — ROSUVASTATIN CALCIUM 20 MG PO TABS: 20.0000 mg | ORAL_TABLET | Freq: Every day | ORAL | 1 refills | Status: DC

## 2023-06-26 ENCOUNTER — Ambulatory Visit (HOSPITAL_BASED_OUTPATIENT_CLINIC_OR_DEPARTMENT_OTHER)
Admission: RE | Admit: 2023-06-26 | Discharge: 2023-06-26 | Disposition: A | Discharge status: Home / Self Care | Payer: BLUE CROSS/BLUE SHIELD | Source: Ambulatory Visit | Attending: Urology | Admitting: Urology

## 2023-06-26 ENCOUNTER — Other Ambulatory Visit (HOSPITAL_BASED_OUTPATIENT_CLINIC_OR_DEPARTMENT_OTHER): Payer: Self-pay | Admitting: Urology

## 2023-06-26 ENCOUNTER — Encounter: Payer: Self-pay | Admitting: Urology

## 2023-06-26 ENCOUNTER — Ambulatory Visit: Payer: BLUE CROSS/BLUE SHIELD | Admitting: Urology

## 2023-06-26 VITALS — BP 130/88 | HR 73 | Ht 68.0 in | Wt 194.0 lb

## 2023-06-26 DIAGNOSIS — R972 Elevated prostate specific antigen [PSA]: Secondary | ICD-10-CM

## 2023-06-26 DIAGNOSIS — Z2989 Encounter for other specified prophylactic measures: Secondary | ICD-10-CM | POA: Diagnosis not present

## 2023-06-26 DIAGNOSIS — N4232 Atypical small acinar proliferation of prostate: Secondary | ICD-10-CM | POA: Diagnosis not present

## 2023-06-26 DIAGNOSIS — C61 Malignant neoplasm of prostate: Secondary | ICD-10-CM

## 2023-06-26 LAB — URINALYSIS, ROUTINE W REFLEX MICROSCOPIC
Bilirubin, UA: NEGATIVE
Glucose, UA: NEGATIVE
Ketones, UA: NEGATIVE
Leukocytes,UA: NEGATIVE
Nitrite, UA: NEGATIVE
Protein,UA: NEGATIVE
RBC, UA: NEGATIVE
Specific Gravity, UA: 1.025 (ref 1.005–1.030)
Urobilinogen, Ur: 0.2 mg/dL (ref 0.2–1.0)
pH, UA: 5.5 (ref 5.0–7.5)

## 2023-06-26 MED ORDER — CEFTRIAXONE SODIUM 1 G IJ SOLR
1.0000 g | Freq: Once | INTRAMUSCULAR | Status: AC
  Administered 2023-06-26: 1 g via INTRAMUSCULAR

## 2023-06-26 NOTE — Progress Notes (Signed)
 Assessment: 1. Elevated PSA     Plan: Postbiopsy instructions given. Return to office in 1 week for biopsy results.  Chief Complaint:  Chief Complaint  Patient presents with   Prostate Biopsy    History of Present Illness:  Caleb Avila is a 63 y.o. male who is seen for further evaluation of elevated PSA. He has previously been seen by Dr. Twylla in [facility] for evaluation of elevated PSA with his last visit in January 2020. PSA results:  6/17 3.08 11/18 5.4 10/19 7.99 12/19 7.0 10/20 6.72 10/23 10.19 11/24 13.33  He has not been evaluated with a prostate biopsy.  Attempts were made at obtaining a PSA in 2020 but insurance denied the imaging study.  Has a history of BPH with lower urinary tract symptoms.  He has been managed with tamsulosin  0.4 mg daily. He is no longer taking tamsulosin .  He has some urgency, frequency, weak stream, and sensation of incomplete emptying.  No dysuria or gross hematuria. IPSS = 14 QOL = 2.  He presents today for further evaluation with a prostate biopsy.  Portions of the above documentation were copied from a prior visit for review purposes only.    Past Medical History:  Past Medical History:  Diagnosis Date   Depression    Empyema lung (HCC) 01/13/2017   Frequent headaches    Hypertension    Multifocal pneumonia 01/07/2017    Past Surgical History:  Past Surgical History:  Procedure Laterality Date   chest wall cyst     FINGER SURGERY Right    VIDEO ASSISTED THORACOSCOPY (VATS)/EMPYEMA Left 01/13/2017   Procedure: VIDEO ASSISTED THORACOSCOPY (VATS) DRAIN EMPYEMA;  Surgeon: Fleeta Hanford Coy, MD;  Location: [facility] OR;  Service: Thoracic;  Laterality: Left;    Allergies:  No Known Allergies  Family History:  Family History  Problem Relation Age of Onset   Depression Other     Social History:  Social History   Tobacco Use   Smoking status: Never   Smokeless tobacco: Never  Vaping Use   Vaping status: Never  Used  Substance Use Topics   Alcohol use: No    Alcohol/week: 0.0 standard drinks of alcohol   Drug use: No    ROS: Constitutional:  Negative for fever, chills, weight loss CV: Negative for chest pain, previous MI, hypertension Respiratory:  Negative for shortness of breath, wheezing, sleep apnea, frequent cough GI:  Negative for nausea, vomiting, bloody stool, GERD  Physical exam: BP 130/88   Pulse 73   Ht 5' 8 (1.727 m)   Wt 194 lb (88 kg)   BMI 29.50 kg/m  GENERAL APPEARANCE:  Well appearing, well developed, well nourished, NAD HEENT:  Atraumatic, normocephalic, oropharynx clear NECK:  Supple without lymphadenopathy or thyromegaly ABDOMEN:  Soft, non-tender, no masses EXTREMITIES:  Moves all extremities well, without clubbing, cyanosis, or edema NEUROLOGIC:  Alert and oriented x 3, normal gait, CN II-XII grossly intact MENTAL STATUS:  appropriate BACK:  Non-tender to palpation, No CVAT SKIN:  Warm, dry, and intact   Results: U/A: negative  TRANSRECTAL ULTRASOUND AND PROSTATE BIOPSY  Indication:  Elevated PSA  Prophylactic antibiotic administration: Rocephin   All medications that could result in increased bleeding were discontinued within an appropriate period of the time of biopsy.  Risk including bleeding and infection were discussed.  Informed consent was obtained.  The patient was placed in the left lateral decubitus position.  PROCEDURE 1.  TRANSRECTAL ULTRASOUND OF THE PROSTATE  The 7 MHz  transrectal probe was used to image the prostate.  Anal stenosis was not noted.  TRUS volume: 45.1 ml  Hypoechoic areas: None  Hyperechoic areas: None  Central calcifications: present  Margins:  normal  Seminal Vesicles: normal   PROCEDURE 2:  PROSTATE BIOPSY  A periprostatic block was performed using 1% lidocaine  and transrectal ultrasound guidance. Under transrectal ultrasound guidance, and using the Biopty gun, prostate biopsies were obtained systematically  from the apex, mid gland, and base bilaterally.  A total of 12 cores were obtained.  Hemostasis was obtained with gentle pressure on the prostate.  The procedures were well-tolerated.  No significant bleeding was noted at the end of the procedure.  The patient was stable for discharge from the office.

## 2023-06-26 NOTE — Progress Notes (Signed)
 IM Injection  Patient is present today for an IM Injection for treatment of infection prevention post prostate biopsy Drug: Ceftriaxone  Dose:1g Location: right upper outer buttocks Lot: [id number] Exp:12/2024 Patient tolerated well, no complications were noted  Performed by: Katyra Tomassetti CMA

## 2023-07-01 ENCOUNTER — Encounter: Payer: Self-pay | Admitting: Urology

## 2023-07-03 ENCOUNTER — Encounter: Payer: Self-pay | Admitting: Urology

## 2023-07-03 ENCOUNTER — Ambulatory Visit: Payer: BLUE CROSS/BLUE SHIELD | Admitting: Urology

## 2023-07-03 VITALS — BP 139/75 | HR 99 | Ht 68.0 in | Wt 194.0 lb

## 2023-07-03 DIAGNOSIS — C61 Malignant neoplasm of prostate: Secondary | ICD-10-CM | POA: Insufficient documentation

## 2023-07-03 NOTE — Progress Notes (Signed)
 Assessment: 1. Prostate cancer (HCC); PSA 13.33; GG 1,2; T1cNxMx; favorable intermediate risk; bx 1/25    Plan: I spent a total of 45 minutes counseling Caleb Avila regarding the diagnosis of localized prostate cancer.  I spent the first 15 minutes discussing the biopsy results.  Using the [facility] prostate cancer nomogram, I cited a probability of 47 percent for localized prostate cancer, 50 percent of extracapsular extension, 6 percent of seminal vesicle involvement, and 7 percent of lymph node involvement.  I discussed the diagnosis of localized prostate cancer in the natural history of prostate cancer. I then spent the next 30 minutes discussing treatment options for localized prostate cancer.  Specifically, I discussed active surveillance, radical prostatectomy (RALP, RRP, RPP), external beam radiation, low dose rate brachytherapy, cryosurgery, and high intensity frequency ultrasound.  I discussed the risk and benefits of each treatment.  I discussed the potential risk of impotence and incontinence as they relate to treatment of prostate cancer.  Questions were answered.  The patient was given literature regarding prostate cancer to review.  Decipher test recommended to confirm diagnosis of favorable intermediate risk prostate cancer and to assist with management decisions. Will contact him with results  Return to office in 1 month  Chief Complaint:  Chief Complaint  Patient presents with   Results    History of Present Illness:  Caleb Avila is a 63 y.o. male who is seen for discussion of recent prostate biopsy results and newly diagnosed prostate cancer.    He has previously been seen by Dr. Cherylene Corrente in [facility] for evaluation of elevated PSA with his last visit in January 2020. PSA results:  6/17 3.08 11/18 5.4 10/19 7.99 12/19 7.0 10/20 6.72 10/23 10.19 11/24 13.33  He has not been evaluated with a prostate biopsy.  Attempts were made at obtaining a PSA in 2020  but insurance denied the imaging study.  Has a history of BPH with lower urinary tract symptoms.  He has been managed with tamsulosin  0.4 mg daily. He is no longer taking tamsulosin .  He has some urgency, frequency, weak stream, and sensation of incomplete emptying.  No dysuria or gross hematuria. IPSS = 14 QOL = 2.  He returns today following his transrectal ultrasound and biopsy of the prostate on 06/26/23. PSA: 13.33 ng/ml TRUS volume:  45.1 ml  PSA density:  0.29 Biopsy results:             Gleason score: 3+ 4 = 7, 3 + 3 = 6            # positive cores: 2/6 on right    3/6 on left            Location of cancer: apex, mid gland, and base  Complications after biopsy: none Patient without significant LUTS.    IPSS = 11 Patient without erectile dysfunction.     Portions of the above documentation were copied from a prior visit for review purposes only.   Past Medical History:  Past Medical History:  Diagnosis Date   Depression    Empyema lung (HCC) 01/13/2017   Frequent headaches    Hypertension    Multifocal pneumonia 01/07/2017    Past Surgical History:  Past Surgical History:  Procedure Laterality Date   chest wall cyst     FINGER SURGERY Right    VIDEO ASSISTED THORACOSCOPY (VATS)/EMPYEMA Left 01/13/2017   Procedure: VIDEO ASSISTED THORACOSCOPY (VATS) DRAIN EMPYEMA;  Surgeon: Heriberto London, MD;  Location: [facility]  OR;  Service: Thoracic;  Laterality: Left;    Allergies:  No Known Allergies  Family History:  Family History  Problem Relation Age of Onset   Depression Other     Social History:  Social History   Tobacco Use   Smoking status: Never   Smokeless tobacco: Never  Vaping Use   Vaping status: Never Used  Substance Use Topics   Alcohol use: No    Alcohol/week: 0.0 standard drinks of alcohol   Drug use: No    ROS: Constitutional:  Negative for fever, chills, weight loss CV: Negative for chest pain, previous MI, hypertension Respiratory:  Negative for  shortness of breath, wheezing, sleep apnea, frequent cough GI:  Negative for nausea, vomiting, bloody stool, GERD  Physical exam: BP 139/75   Pulse 99   Ht 5\' 8"  (1.727 m)   Wt 194 lb (88 kg)   BMI 29.50 kg/m  GENERAL APPEARANCE:  Well appearing, well developed, well nourished, NAD HEENT:  Atraumatic, normocephalic, oropharynx clear NECK:  Supple without lymphadenopathy or thyromegaly ABDOMEN:  Soft, non-tender, no masses EXTREMITIES:  Moves all extremities well, without clubbing, cyanosis, or edema NEUROLOGIC:  Alert and oriented x 3, normal gait, CN II-XII grossly intact MENTAL STATUS:  appropriate BACK:  Non-tender to palpation, No CVAT SKIN:  Warm, dry, and intact   Results: U/A: 3-10 RBCs

## 2023-07-04 LAB — URINALYSIS, ROUTINE W REFLEX MICROSCOPIC
Bilirubin, UA: NEGATIVE
Glucose, UA: NEGATIVE
Ketones, UA: NEGATIVE
Leukocytes,UA: NEGATIVE
Nitrite, UA: NEGATIVE
Protein,UA: NEGATIVE
Specific Gravity, UA: 1.015 (ref 1.005–1.030)
Urobilinogen, Ur: 0.2 mg/dL (ref 0.2–1.0)
pH, UA: 7 (ref 5.0–7.5)

## 2023-07-04 LAB — MICROSCOPIC EXAMINATION

## 2023-07-13 DIAGNOSIS — C61 Malignant neoplasm of prostate: Secondary | ICD-10-CM | POA: Diagnosis not present

## 2023-07-15 ENCOUNTER — Encounter: Payer: Self-pay | Admitting: Urology

## 2023-07-29 ENCOUNTER — Ambulatory Visit: Payer: BLUE CROSS/BLUE SHIELD | Admitting: Urology

## 2023-07-29 ENCOUNTER — Encounter: Payer: Self-pay | Admitting: Urology

## 2023-07-29 VITALS — BP 147/89 | HR 94 | Ht 68.0 in | Wt 195.0 lb

## 2023-07-29 DIAGNOSIS — C61 Malignant neoplasm of prostate: Secondary | ICD-10-CM | POA: Diagnosis not present

## 2023-07-29 LAB — URINALYSIS, ROUTINE W REFLEX MICROSCOPIC
Bilirubin, UA: NEGATIVE
Glucose, UA: NEGATIVE
Ketones, UA: NEGATIVE
Leukocytes,UA: NEGATIVE
Nitrite, UA: NEGATIVE
Protein,UA: NEGATIVE
RBC, UA: NEGATIVE
Specific Gravity, UA: 1.02 (ref 1.005–1.030)
Urobilinogen, Ur: 1 mg/dL (ref 0.2–1.0)
pH, UA: 6 (ref 5.0–7.5)

## 2023-07-29 NOTE — Progress Notes (Signed)
 Assessment: 1. Prostate cancer (HCC); PSA 13.33; GG 1,2; T1cNxMx; favorable intermediate risk; bx 1/25     Plan: I reviewed the decipher results with the patient today.  I also reviewed his prior biopsy results. I again discussed options for management of favorable intermediate risk prostate cancer including active surveillance, radical prostatectomy, and radiation therapy.  He is primarily interested in pursuing active surveillance at this time. I discussed the role of active surveillance for management of low risk and favorable intermediate risk prostate cancer. The advantages of active surveillance were discussed including the following:  - Somewhere between 50% and 68% of eligible patients may safely avoid treatment for at least 10 years - Patient's will avoid possible side effects of definitive therapy that may be unnecessary - Quality of life/normal activities will be less affected while on active surveillance - Risk of unnecessary treatment of small, indolent cancers will be reduced  Limitations of active surveillance discussed including: -Between 32% and 50% of patients will require treatment by 10 years, although treatment delays do not seem to impact cure rate -Although the risk is very low (<0.5% according to most published series), it is possible for cancers progressed to a regional or metastatic stage while on active surveillance  I also discussed that patients who choose active surveillance should have regular follow-up including PSA testing approximately every 6 months, DRE annually, confirmatory biopsy approximately 1 year after initial diagnosis, multiparametric MRI every 12 months or as clinically indicated.  Recommend prostate MRI in approximately 3 months.  Chief Complaint:  Chief Complaint  Patient presents with   Prostate Cancer    History of Present Illness:  Caleb Avila is a 63 y.o. male who is seen for recently diagnosed favorable intermediate risk  prostate cancer.    He has previously been seen by Dr. Cherylene Corrente in [facility] for evaluation of elevated PSA with his last visit there in January 2020. PSA results:  6/17 3.08 11/18 5.4 10/19 7.99 12/19 7.0 10/20 6.72 10/23 10.19 11/24 13.33  Attempts were made at obtaining a MRI in 2020 but insurance denied the imaging study.  Has a history of BPH with lower urinary tract symptoms.  He has been managed with tamsulosin  0.4 mg daily. He is no longer taking tamsulosin .  He has some urgency, frequency, weak stream, and sensation of incomplete emptying.  No dysuria or gross hematuria. IPSS = 14 QOL = 2.  He underwent evaluation with a transrectal ultrasound and biopsy of the prostate on 06/26/23. PSA: 13.33 ng/ml TRUS volume:  45.1 ml  PSA density:  0.29 Biopsy results:             Gleason score: 3+ 4 = 7, 3 + 3 = 6            # positive cores: 2/6 on right    3/6 on left            Location of cancer: apex, mid gland, and base  Complications after biopsy: none Patient without significant LUTS.    IPSS = 11 Patient without erectile dysfunction.    Decipher test showed low risk.  He returns today for follow-up and further discussion of management options for favorable intermediate risk prostate cancer.  His lower urinary tract symptoms remained stable.  He does have some urgency and nocturia x 2.  No dysuria or gross hematuria. IPSS = 10.   Portions of the above documentation were copied from a prior visit for review purposes only.  Past Medical History:  Past Medical History:  Diagnosis Date   Depression    Empyema lung (HCC) 01/13/2017   Frequent headaches    Hypertension    Multifocal pneumonia 01/07/2017    Past Surgical History:  Past Surgical History:  Procedure Laterality Date   chest wall cyst     FINGER SURGERY Right    VIDEO ASSISTED THORACOSCOPY (VATS)/EMPYEMA Left 01/13/2017   Procedure: VIDEO ASSISTED THORACOSCOPY (VATS) DRAIN EMPYEMA;  Surgeon: Heriberto London, MD;  Location: [facility] OR;  Service: Thoracic;  Laterality: Left;    Allergies:  No Known Allergies  Family History:  Family History  Problem Relation Age of Onset   Depression Other     Social History:  Social History   Tobacco Use   Smoking status: Never   Smokeless tobacco: Never  Vaping Use   Vaping status: Never Used  Substance Use Topics   Alcohol use: No    Alcohol/week: 0.0 standard drinks of alcohol   Drug use: No    ROS: Constitutional:  Negative for fever, chills, weight loss CV: Negative for chest pain, previous MI, hypertension Respiratory:  Negative for shortness of breath, wheezing, sleep apnea, frequent cough GI:  Negative for nausea, vomiting, bloody stool, GERD  Physical exam: BP (!) 147/89   Pulse 94   Ht 5\' 8"  (1.727 m)   Wt 195 lb (88.5 kg)   BMI 29.65 kg/m  GENERAL APPEARANCE:  Well appearing, well developed, well nourished, NAD HEENT:  Atraumatic, normocephalic, oropharynx clear NECK:  Supple without lymphadenopathy or thyromegaly ABDOMEN:  Soft, non-tender, no masses EXTREMITIES:  Moves all extremities well, without clubbing, cyanosis, or edema NEUROLOGIC:  Alert and oriented x 3, normal gait, CN II-XII grossly intact MENTAL STATUS:  appropriate BACK:  Non-tender to palpation, No CVAT SKIN:  Warm, dry, and intact   Results: U/A: negative

## 2023-09-01 ENCOUNTER — Other Ambulatory Visit: Payer: BLUE CROSS/BLUE SHIELD

## 2023-09-09 ENCOUNTER — Telehealth: Payer: Self-pay | Admitting: Family Medicine

## 2023-09-09 NOTE — Telephone Encounter (Signed)
 Dr Clent Ridges is leaving the practice and your New Patient or Transfer of Care appointment needs to be rescheduled with another provider. Please call the office to schedule a Transfer of Care to either Dr Charlann Lange, Darleen Crocker or Kara Dies, NP.  E2C2 please schedule TOC

## 2023-10-06 ENCOUNTER — Ambulatory Visit
Admission: RE | Admit: 2023-10-06 | Discharge: 2023-10-06 | Disposition: A | Discharge status: Home / Self Care | Payer: BLUE CROSS/BLUE SHIELD | Source: Ambulatory Visit | Attending: Urology | Admitting: Urology

## 2023-10-06 DIAGNOSIS — R972 Elevated prostate specific antigen [PSA]: Secondary | ICD-10-CM | POA: Diagnosis not present

## 2023-10-06 DIAGNOSIS — C61 Malignant neoplasm of prostate: Secondary | ICD-10-CM

## 2023-10-06 MED ORDER — GADOPICLENOL 0.5 MMOL/ML IV SOLN
9.0000 mL | Freq: Once | INTRAVENOUS | Status: AC | PRN
  Administered 2023-10-06: 9 mL via INTRAVENOUS

## 2023-10-11 ENCOUNTER — Encounter: Payer: Self-pay | Admitting: Urology

## 2023-10-27 NOTE — Progress Notes (Unsigned)
 Assessment: 1. Prostate cancer (HCC); PSA 13.33; GG 1,2; T1cNxMx; favorable intermediate risk; bx 1/25     Plan: I reviewed the MRI results with the patient today.  I also reviewed his prior biopsy results. MRI results are congruent with the patient's prior biopsy.  I again discussed options for management of favorable intermediate risk prostate cancer including active surveillance, radical prostatectomy, and radiation therapy.  He would like to continue with active surveillance at this time. I discussed the role of active surveillance for management of low risk and favorable intermediate risk prostate cancer. The advantages of active surveillance were discussed including the following:  - Somewhere between 50% and 68% of eligible patients may safely avoid treatment for at least 10 years - Patient's will avoid possible side effects of definitive therapy that may be unnecessary - Quality of life/normal activities will be less affected while on active surveillance - Risk of unnecessary treatment of small, indolent cancers will be reduced  Limitations of active surveillance discussed including: -Between 32% and 50% of patients will require treatment by 10 years, although treatment delays do not seem to impact cure rate -Although the risk is very low (<0.5% according to most published series), it is possible for cancers progressed to a regional or metastatic stage while on active surveillance  I also discussed that patients who choose active surveillance should have regular follow-up including PSA testing approximately every 6 months, DRE annually, confirmatory biopsy approximately 1 year after initial diagnosis, multiparametric MRI every 12 months or as clinically indicated.  PSA today. Return to office in 6 months.   Chief Complaint:  Chief Complaint  Patient presents with   Prostate Cancer    History of Present Illness:  Caleb Avila is a 63 y.o. male who is seen for  recently diagnosed favorable intermediate risk prostate cancer.    He has previously been seen by Dr. Cherylene Corrente in [facility] for evaluation of elevated PSA with his last visit there in January 2020. PSA results:  6/17 3.08 11/18 5.4 10/19 7.99 12/19 7.0 10/20 6.72 10/23 10.19 11/24 13.33  Attempts were made at obtaining a MRI in 2020 but insurance denied the imaging study.  Has a history of BPH with lower urinary tract symptoms.  He has been managed with tamsulosin  0.4 mg daily. He is no longer taking tamsulosin .  He has some urgency, frequency, weak stream, and sensation of incomplete emptying.  No dysuria or gross hematuria. IPSS = 14 QOL = 2.  He underwent evaluation with a transrectal ultrasound and biopsy of the prostate on 06/26/23. PSA: 13.33 ng/ml TRUS volume:  45.1 ml  PSA density:  0.29 Biopsy results:             Gleason score: 3+ 4 = 7, 3 + 3 = 6            # positive cores: 2/6 on right    3/6 on left            Location of cancer: apex, mid gland, and base  Complications after biopsy: none Patient without significant LUTS.    IPSS = 11 Patient without erectile dysfunction.    Decipher test showed low risk. Prostate MRI from 10/07/2023 showed the following: PI-RADS 4 left mid gland and apex PI-RADS 3 left apex PI-RADS 3 left apex PI-RADS 4 left mid gland PI-RADS 3 right apex Prostate volume = 52.3 cm  He returns today for follow-up and continued active surveillance of his favorable intermediate risk prostate  cancer.  No new urinary symptoms.  He continues with some urgency.  No dysuria or gross hematuria. IPSS = 8/1.  Portions of the above documentation were copied from a prior visit for review purposes only.   Past Medical History:  Past Medical History:  Diagnosis Date   Depression    Empyema lung (HCC) 01/13/2017   Frequent headaches    Hypertension    Multifocal pneumonia 01/07/2017    Past Surgical History:  Past Surgical History:  Procedure  Laterality Date   chest wall cyst     FINGER SURGERY Right    VIDEO ASSISTED THORACOSCOPY (VATS)/EMPYEMA Left 01/13/2017   Procedure: VIDEO ASSISTED THORACOSCOPY (VATS) DRAIN EMPYEMA;  Surgeon: Heriberto London, MD;  Location: [facility] OR;  Service: Thoracic;  Laterality: Left;    Allergies:  No Known Allergies  Family History:  Family History  Problem Relation Age of Onset   Depression Other     Social History:  Social History   Tobacco Use   Smoking status: Never   Smokeless tobacco: Never  Vaping Use   Vaping status: Never Used  Substance Use Topics   Alcohol use: No    Alcohol/week: 0.0 standard drinks of alcohol   Drug use: No    ROS: Constitutional:  Negative for fever, chills, weight loss CV: Negative for chest pain, previous MI, hypertension Respiratory:  Negative for shortness of breath, wheezing, sleep apnea, frequent cough GI:  Negative for nausea, vomiting, bloody stool, GERD  Physical exam: BP 121/83   Pulse 71   Ht 5\' 8"  (1.727 m)   Wt 200 lb (90.7 kg)   BMI 30.41 kg/m  GENERAL APPEARANCE:  Well appearing, well developed, well nourished, NAD HEENT:  Atraumatic, normocephalic, oropharynx clear NECK:  Supple without lymphadenopathy or thyromegaly ABDOMEN:  Soft, non-tender, no masses EXTREMITIES:  Moves all extremities well, without clubbing, cyanosis, or edema NEUROLOGIC:  Alert and oriented x 3, normal gait, CN II-XII grossly intact MENTAL STATUS:  appropriate BACK:  Non-tender to palpation, No CVAT SKIN:  Warm, dry, and intact   Results: U/A: negative

## 2023-10-28 ENCOUNTER — Encounter: Payer: Self-pay | Admitting: Family Medicine

## 2023-10-28 ENCOUNTER — Encounter: Payer: Self-pay | Admitting: Urology

## 2023-10-28 ENCOUNTER — Ambulatory Visit (INDEPENDENT_AMBULATORY_CARE_PROVIDER_SITE_OTHER): Payer: BLUE CROSS/BLUE SHIELD | Admitting: Urology

## 2023-10-28 ENCOUNTER — Ambulatory Visit: Payer: BLUE CROSS/BLUE SHIELD | Admitting: Family Medicine

## 2023-10-28 VITALS — BP 118/72 | HR 83 | Temp 98.4°F | Resp 20 | Ht 68.0 in | Wt 200.1 lb

## 2023-10-28 VITALS — BP 121/83 | HR 71 | Ht 68.0 in | Wt 200.0 lb

## 2023-10-28 DIAGNOSIS — E785 Hyperlipidemia, unspecified: Secondary | ICD-10-CM

## 2023-10-28 DIAGNOSIS — C61 Malignant neoplasm of prostate: Secondary | ICD-10-CM

## 2023-10-28 DIAGNOSIS — I10 Essential (primary) hypertension: Secondary | ICD-10-CM

## 2023-10-28 LAB — URINALYSIS, ROUTINE W REFLEX MICROSCOPIC
Bilirubin, UA: NEGATIVE
Glucose, UA: NEGATIVE
Ketones, UA: NEGATIVE
Leukocytes,UA: NEGATIVE
Nitrite, UA: NEGATIVE
Protein,UA: NEGATIVE
RBC, UA: NEGATIVE
Specific Gravity, UA: 1.02 (ref 1.005–1.030)
Urobilinogen, Ur: 0.2 mg/dL (ref 0.2–1.0)
pH, UA: 5.5 (ref 5.0–7.5)

## 2023-10-28 MED ORDER — ROSUVASTATIN CALCIUM 20 MG PO TABS: 20.0000 mg | ORAL_TABLET | Freq: Every day | ORAL | 3 refills | Status: AC

## 2023-10-28 MED ORDER — AMLODIPINE BESYLATE 5 MG PO TABS: ORAL_TABLET | ORAL | 3 refills | Status: DC

## 2023-10-28 NOTE — Progress Notes (Unsigned)
 SUBJECTIVE:   Chief Complaint  Patient presents with   Medical Management of Chronic Issues    6 month follow up   HPI Presents for follow up chronic disease management  Discussed the use of AI scribe software for clinical note transcription with the patient, who gave verbal consent to proceed.  History of Present Illness Caleb Avila is a 63 year old male with hypertension who presents for a six-month follow-up.  He is currently taking Norvasc  (amlodipine ) 5 mg daily for blood pressure management. He generally has good blood pressure control but experiences occasional light headaches after consuming salty foods, such as barbecue, which he eats about once a month. He does not add salt to his food but acknowledges that some foods, like [place] cuisine, have high salt content. He drinks a significant amount of water daily, approximately two large containers at work, but does not regularly check his blood pressure at home.  He is also on a cholesterol medication, 20 mg, and plans to call in for a refill when he is low. He had a prostate biopsy and was informed that the results showed low-grade cancer. He is scheduled to discuss further management with his urologist. He is considering a colonoscopy but wants to address the prostate issue first.  He lives between [address] and [place], close to his job at [employer] in [place]. He plans to increase his physical activity as the weather gets warmer, aiming to walk more.  No chest pain, shortness of breath, swelling in the legs, or issues with urination. He wakes up at 3 AM to urinate but does not experience any problems. No significant weight loss, though he believes he has gained some weight since his last visit.     PERTINENT PMH / PSH: As above  OBJECTIVE:  BP 118/72   Pulse 83   Temp 98.4 F (36.9 C)   Resp 20   Ht 5\' 8"  (1.727 m)   Wt 200 lb 2 oz (90.8 kg)   SpO2 98%   BMI 30.43 kg/m    Physical  Exam Vitals reviewed.  Constitutional:      Appearance: He is obese.  HENT:     Head: Normocephalic.     Right Ear: Tympanic membrane, ear canal and external ear normal.     Left Ear: Tympanic membrane, ear canal and external ear normal.     Nose: Nose normal.     Mouth/Throat:     Mouth: Mucous membranes are moist.  Eyes:     Conjunctiva/sclera: Conjunctivae normal.     Pupils: Pupils are equal, round, and reactive to light.  Neck:     Thyroid : No thyromegaly or thyroid  tenderness.     Vascular: No carotid bruit.  Cardiovascular:     Rate and Rhythm: Normal rate and regular rhythm.     Pulses: Normal pulses.     Heart sounds: Normal heart sounds.  Pulmonary:     Effort: Pulmonary effort is normal.     Breath sounds: Normal breath sounds.  Abdominal:     General: Abdomen is flat. Bowel sounds are normal.     Palpations: Abdomen is soft.  Musculoskeletal:        General: Normal range of motion.     Cervical back: Normal range of motion and neck supple.     Right lower leg: No edema.     Left lower leg: No edema.  Lymphadenopathy:     Cervical: No cervical adenopathy.  Neurological:  Mental Status: He is alert.  Psychiatric:        Mood and Affect: Mood normal.        Behavior: Behavior normal.        Thought Content: Thought content normal.        Judgment: Judgment normal.           10/28/2023    9:18 AM 04/22/2023    8:39 AM 07/16/2022   11:14 AM 06/19/2022    9:42 AM 03/30/2019    9:00 AM  Depression screen PHQ 2/9  Decreased Interest 0 0 0 0 0  Down, Depressed, Hopeless 0 0 0 0 0  PHQ - 2 Score 0 0 0 0 0  Altered sleeping 0 2 0 0   Tired, decreased energy 0 0 0 1   Change in appetite 0 0 0 0   Feeling bad or failure about yourself  0 0 0 0   Trouble concentrating 0 0 0 0   Moving slowly or fidgety/restless 0 0 0 0   Suicidal thoughts 0 0 0 0   PHQ-9 Score 0 2 0 1   Difficult doing work/chores Not difficult at all Not difficult at all Not difficult at  all Not difficult at all       10/28/2023    9:18 AM 04/22/2023    8:40 AM 07/16/2022   11:15 AM 06/19/2022    9:42 AM  GAD 7 : Generalized Anxiety Score  Nervous, Anxious, on Edge 0 0  0  Control/stop worrying 0 0 0 0  Worry too much - different things 0 0 0 0  Trouble relaxing 0 0 0 0  Restless 0 0 0 0  Easily annoyed or irritable 0 0 0 0  Afraid - awful might happen 0 0 0 0  Total GAD 7 Score 0 0  0  Anxiety Difficulty Not difficult at all Not difficult at all Not difficult at all Not difficult at all    ASSESSMENT/PLAN:  Essential hypertension Assessment & Plan: Hypertension is well-controlled on amlodipine  5 mg. Office blood pressure is 118/70 mmHg. Discussed dietary salt's impact on blood pressure and the importance of home monitoring. - Refill amlodipine  5 mg daily. - Encourage home blood pressure monitoring, especially after consuming salty foods. - Advise dietary modifications to reduce salt intake.  Orders: -     amLODIPine  Besylate; TAKE 1 TABLET BY MOUTH DAILY  MUST USE MAIL ORDER FOR MAINT  MEDS  Dispense: 90 tablet; Refill: 3  Hyperlipidemia, unspecified hyperlipidemia type Assessment & Plan: Refill Crestor  20 mg daily Lipids at next visit  Orders: -     Rosuvastatin  Calcium ; Take 1 tablet (20 mg total) by mouth daily.  Dispense: 90 tablet; Refill: 3  Prostate cancer (HCC); PSA 13.33; GG 1,2; T1cNxMx; favorable intermediate risk; bx 1/25 Assessment & Plan: Low-grade prostate cancer under surveillance. Recent biopsy indicates low-grade cancer with no immediate need for surgical intervention. He is following up with urology for management and monitoring, with plans to discuss future biopsy and monitoring strategy. - Follow up with urology for management and monitoring of prostate cancer.        Colon cancer screening - Prefers to wait to schedule colonoscopy after possible prostate procedure  PDMP reviewed  Return if symptoms worsen or fail to improve, for  PCP.  Valli Gaw, MD

## 2023-10-28 NOTE — Patient Instructions (Signed)
 It was a pleasure meeting you today. Thank you for allowing me to take part in your health care.  Our goals for today as we discussed include:  Refills sent for requested medications  Avoid high sodium food, intake  Please let me know when ready to send referral for Colonoscopy   This is a list of the screening recommended for you and due dates:  Health Maintenance  Topic Date Due   DTaP/Tdap/Td vaccine (1 - Tdap) Never done   Colon Cancer Screening  Never done   COVID-19 Vaccine (5 - Moderna risk 2024-25 season) 11/02/2023   Flu Shot  01/17/2024   Hepatitis C Screening  Completed   HIV Screening  Completed   Zoster (Shingles) Vaccine  Completed   HPV Vaccine  Aged Out   Meningitis B Vaccine  Aged Out      If you have any questions or concerns, please do not hesitate to call the office at [phone].  I look forward to our next visit and until then take care and stay safe.  Regards,   Valli Gaw, MD   [facility]

## 2023-10-29 ENCOUNTER — Ambulatory Visit: Payer: Self-pay | Admitting: Urology

## 2023-10-29 LAB — PSA: Prostate Specific Ag, Serum: 13.1 ng/mL — ABNORMAL HIGH (ref 0.0–4.0)

## 2023-10-31 ENCOUNTER — Encounter: Payer: Self-pay | Admitting: Family Medicine

## 2023-10-31 NOTE — Assessment & Plan Note (Signed)
 Hypertension is well-controlled on amlodipine  5 mg. Office blood pressure is 118/70 mmHg. Discussed dietary salt's impact on blood pressure and the importance of home monitoring. - Refill amlodipine  5 mg daily. - Encourage home blood pressure monitoring, especially after consuming salty foods. - Advise dietary modifications to reduce salt intake.

## 2023-10-31 NOTE — Assessment & Plan Note (Signed)
 Refill Crestor  20 mg daily Lipids at next visit

## 2023-10-31 NOTE — Assessment & Plan Note (Signed)
 Low-grade prostate cancer under surveillance. Recent biopsy indicates low-grade cancer with no immediate need for surgical intervention. He is following up with urology for management and monitoring, with plans to discuss future biopsy and monitoring strategy. - Follow up with urology for management and monitoring of prostate cancer.

## 2024-01-24 ENCOUNTER — Encounter: Payer: Self-pay | Admitting: Nurse Practitioner

## 2024-01-24 ENCOUNTER — Ambulatory Visit (INDEPENDENT_AMBULATORY_CARE_PROVIDER_SITE_OTHER): Admitting: Nurse Practitioner

## 2024-01-24 VITALS — BP 118/80 | HR 79 | Temp 98.1°F | Ht 68.0 in | Wt 200.6 lb

## 2024-01-24 DIAGNOSIS — K5900 Constipation, unspecified: Secondary | ICD-10-CM | POA: Diagnosis not present

## 2024-01-24 DIAGNOSIS — I10 Essential (primary) hypertension: Secondary | ICD-10-CM

## 2024-01-24 DIAGNOSIS — E785 Hyperlipidemia, unspecified: Secondary | ICD-10-CM

## 2024-01-24 DIAGNOSIS — R7303 Prediabetes: Secondary | ICD-10-CM | POA: Diagnosis not present

## 2024-01-24 DIAGNOSIS — C61 Malignant neoplasm of prostate: Secondary | ICD-10-CM

## 2024-01-24 NOTE — Progress Notes (Signed)
 Established Patient Office Visit  Subjective:  Patient ID: Caleb Avila, male    DOB: 07-02-1960  Age: 63 y.o. MRN: [id number]  CC:  Chief Complaint  Patient presents with   Establish Care    Transfer of Care   Discussed the use of a AI scribe software for clinical note transcription with the patient, who gave verbal consent to proceed.  HPI  Caleb Avila presents for transfer of care his previous PCP was Dr. Maribeth.  Hypertension: He manages hypertension with amlodipine .  He does not blood pressure regularly at home.  Hyperlipidemia: He is on Crestor  20 mg Lab Results  Component Value Date   CHOL 98 04/22/2023   HDL 39.50 04/22/2023   LDLCALC 46 04/22/2023   LDLDIRECT 50.0 06/19/2022   TRIG 64.0 04/22/2023   CHOLHDL 2 04/22/2023   Prostate cancer: He has low-grade prostate cancer, had biopsy of the prostate in January.  He is under surveillance.  He is following up with urology. He denies difficulty urinating but experiences nocturia.  Constipation: He experiences occasional constipation managed by miralax .      HPI   Past Medical History:  Diagnosis Date   Depression    Empyema lung (HCC) 01/13/2017   Frequent headaches    Hypertension    Multifocal pneumonia 01/07/2017    Past Surgical History:  Procedure Laterality Date   chest wall cyst     FINGER SURGERY Right    VIDEO ASSISTED THORACOSCOPY (VATS)/EMPYEMA Left 01/13/2017   Procedure: VIDEO ASSISTED THORACOSCOPY (VATS) DRAIN EMPYEMA;  Surgeon: Fleeta Hanford Coy, MD;  Location: [facility] OR;  Service: Thoracic;  Laterality: Left;    Family History  Problem Relation Age of Onset   Depression Other     Social History   Socioeconomic History   Marital status: Single    Spouse name: Not on file   Number of children: Not on file   Years of education: Not on file   Highest education level: Some college, no degree  Occupational History   Not on file  Tobacco Use   Smoking status: Never    Smokeless tobacco: Never  Vaping Use   Vaping status: Never Used  Substance and Sexual Activity   Alcohol use: Yes    Comment: occasionally   Drug use: No   Sexual activity: Yes    Birth control/protection: None  Other Topics Concern   Not on file  Social History Narrative   Not on file   Social Drivers of Health   Financial Resource Strain: Low Risk  (01/20/2024)   Overall Financial Resource Strain (CARDIA)    Difficulty of Paying Living Expenses: Not hard at all  Food Insecurity: No Food Insecurity (01/20/2024)   Hunger Vital Sign    Worried About Running Out of Food in the Last Year: Never true    Ran Out of Food in the Last Year: Never true  Transportation Needs: No Transportation Needs (01/20/2024)   PRAPARE - Administrator, Civil Service (Medical): No    Lack of Transportation (Non-Medical): No  Physical Activity: Sufficiently Active (01/20/2024)   Exercise Vital Sign    Days of Exercise per Week: 6 days    Minutes of Exercise per Session: 60 min  Recent Concern: Physical Activity - Insufficiently Active (10/25/2023)   Exercise Vital Sign    Days of Exercise per Week: 3 days    Minutes of Exercise per Session: 20 min  Stress: No Stress Concern Present (01/20/2024)  [employer] of Occupational Health - Occupational Stress Questionnaire    Feeling of Stress: Not at all  Social Connections: Socially Isolated (01/20/2024)   Social Connection and Isolation Panel    Frequency of Communication with Friends and Family: Twice a week    Frequency of Social Gatherings with Friends and Family: Twice a week    Attends Religious Services: Never    Database administrator or Organizations: No    Attends Engineer, structural: Not on file    Marital Status: Divorced  Catering manager Violence: Not on file     Outpatient Medications Prior to Visit  Medication Sig Dispense Refill   amLODipine  (NORVASC ) 5 MG tablet TAKE 1 TABLET BY MOUTH DAILY  MUST USE MAIL ORDER  FOR MAINT  MEDS 90 tablet 3   aspirin  EC 325 MG EC tablet Take 1 tablet (325 mg total) by mouth daily. 30 tablet 0   Multiple Vitamin (MULTIVITAMIN) tablet Take 1 tablet by mouth daily.     rosuvastatin  (CRESTOR ) 20 MG tablet Take 1 tablet (20 mg total) by mouth daily. 90 tablet 3   No facility-administered medications prior to visit.    No Known Allergies  ROS Review of Systems Negative unless indicated in HPI.    Objective:    Physical Exam  BP 118/80   Pulse 79   Temp 98.1 F (36.7 C)   Ht 5' 8 (1.727 m)   Wt 200 lb 9.6 oz (91 kg)   SpO2 94%   BMI 30.50 kg/m  Wt Readings from Last 3 Encounters:  01/24/24 200 lb 9.6 oz (91 kg)  10/28/23 200 lb (90.7 kg)  10/28/23 200 lb 2 oz (90.8 kg)     Health Maintenance  Topic Date Due   DTaP/Tdap/Td (1 - Tdap) Never done   Colonoscopy  Never done   Pneumococcal Vaccine: 50+ Years (1 of 1 - PCV) Never done   COVID-19 Vaccine (5 - Moderna risk 2024-25 season) 11/02/2023   INFLUENZA VACCINE  09/15/2024 (Originally 01/17/2024)   Hepatitis C Screening  Completed   HIV Screening  Completed   Zoster Vaccines- Shingrix  Completed   Hepatitis B Vaccines 19-59 Average Risk  Aged Out   HPV VACCINES  Aged Out   Meningococcal B Vaccine  Aged Out    There are no preventive care reminders to display for this patient.  Lab Results  Component Value Date   TSH 1.26 03/28/2018   Lab Results  Component Value Date   WBC 6.7 04/16/2022   HGB 16.6 04/16/2022   HCT 49.2 04/16/2022   MCV 89.7 04/16/2022   PLT 171.0 04/16/2022   Lab Results  Component Value Date   NA 143 04/22/2023   K 4.2 04/22/2023   CO2 28 04/22/2023   GLUCOSE 102 (H) 04/22/2023   BUN 21 04/22/2023   CREATININE 1.10 04/22/2023   BILITOT 0.5 04/22/2023   ALKPHOS 64 04/22/2023   AST 19 04/22/2023   ALT 23 04/22/2023   PROT 7.5 04/22/2023   ALBUMIN  4.5 04/22/2023   CALCIUM  9.7 04/22/2023   ANIONGAP 10 01/17/2017   GFR 73.14 04/22/2023   Lab Results   Component Value Date   CHOL 98 04/22/2023   Lab Results  Component Value Date   HDL 39.50 04/22/2023   Lab Results  Component Value Date   LDLCALC 46 04/22/2023   Lab Results  Component Value Date   TRIG 64.0 04/22/2023   Lab Results  Component Value Date  CHOLHDL 2 04/22/2023   Lab Results  Component Value Date   HGBA1C 5.6 04/22/2023      Assessment & Plan:  Prostate cancer (HCC); PSA 13.33; GG 1,2; T1cNxMx; favorable intermediate risk; bx 1/25 Assessment & Plan: Patient has favorable intermediate risk prostate cancer.  He would like to continue with active surveillance rather than radial prostatectomy.  He is followed by urology. - Follow up with urology for management and monitoring of prostate cancer.    Hyperlipidemia, unspecified hyperlipidemia type Assessment & Plan: Chronic stable. -Continue Crestor  20 mg daily.   Constipation, unspecified constipation type Assessment & Plan: Intermittent constipation. -Continue MiraLAX  as needed.   Prediabetes Assessment & Plan: Lab Results  Component Value Date   HGBA1C 5.6 04/22/2023  - Advised lifestyle intervention including diet and exercise.    Essential hypertension Assessment & Plan: Chronic stable. -Continue amlodipine  5 mg daily.      Follow-up: Return in about 3 months (around 04/25/2024) for physical, follow up with fasting lab 2 days prior.   Arda Daggs, NP

## 2024-02-15 DIAGNOSIS — R7303 Prediabetes: Secondary | ICD-10-CM | POA: Insufficient documentation

## 2024-02-15 NOTE — Assessment & Plan Note (Signed)
 Intermittent constipation. -Continue MiraLAX  as needed.

## 2024-02-15 NOTE — Assessment & Plan Note (Signed)
 Chronic   stable  Continue Crestor  20 mg daily

## 2024-02-15 NOTE — Assessment & Plan Note (Signed)
 Patient has favorable intermediate risk prostate cancer.  He would like to continue with active surveillance rather than radial prostatectomy.  He is followed by urology. - Follow up with urology for management and monitoring of prostate cancer.

## 2024-02-15 NOTE — Assessment & Plan Note (Signed)
Chronic stable  Continue amlodipine '5mg'$  daily

## 2024-02-15 NOTE — Assessment & Plan Note (Signed)
 Lab Results  Component Value Date   HGBA1C 5.6 04/22/2023  - Advised lifestyle intervention including diet and exercise.

## 2024-04-27 ENCOUNTER — Encounter: Payer: Self-pay | Admitting: Urology

## 2024-04-27 ENCOUNTER — Ambulatory Visit: Admitting: Urology

## 2024-04-27 VITALS — BP 146/80 | HR 101 | Ht 68.0 in | Wt 215.0 lb

## 2024-04-27 DIAGNOSIS — C61 Malignant neoplasm of prostate: Secondary | ICD-10-CM

## 2024-04-27 LAB — URINALYSIS, ROUTINE W REFLEX MICROSCOPIC
Bilirubin, UA: NEGATIVE
Glucose, UA: NEGATIVE
Ketones, UA: NEGATIVE
Leukocytes,UA: NEGATIVE
Nitrite, UA: NEGATIVE
Protein,UA: NEGATIVE
RBC, UA: NEGATIVE
Specific Gravity, UA: 1.02 (ref 1.005–1.030)
Urobilinogen, Ur: 0.2 mg/dL (ref 0.2–1.0)
pH, UA: 5.5 (ref 5.0–7.5)

## 2024-04-27 NOTE — Progress Notes (Signed)
 Assessment: 1. Prostate cancer (HCC); PSA 13.33; GG 1,2; T1cNxMx; favorable intermediate risk; bx 1/25     Plan: I again discussed options for management of favorable intermediate risk prostate cancer including active surveillance, radical prostatectomy, and radiation therapy.  He would like to continue with active surveillance at this time. I discussed the role of active surveillance for management of low risk and favorable intermediate risk prostate cancer. The advantages of active surveillance were discussed including the following:  - Somewhere between 50% and 68% of eligible patients may safely avoid treatment for at least 10 years - Patient's will avoid possible side effects of definitive therapy that may be unnecessary - Quality of life/normal activities will be less affected while on active surveillance - Risk of unnecessary treatment of small, indolent cancers will be reduced  Limitations of active surveillance discussed including: -Between 32% and 50% of patients will require treatment by 10 years, although treatment delays do not seem to impact cure rate -Although the risk is very low (<0.5% according to most published series), it is possible for cancers progressed to a regional or metastatic stage while on active surveillance  I also discussed that patients who choose active surveillance should have regular follow-up including PSA testing approximately every 6 months, DRE annually, confirmatory biopsy approximately 1 year after initial diagnosis, multiparametric MRI every 12 months or as clinically indicated.  PSA with next lab draw this month Return to office in 2 months for confirmatory biopsy   Chief Complaint:  Chief Complaint  Patient presents with   Prostate Cancer    History of Present Illness:  Caleb Avila is a 63 y.o. male who is seen for recently diagnosed favorable intermediate risk prostate cancer.    He has previously been seen by Dr. Twylla in  [facility] for evaluation of elevated PSA with his last visit there in January 2020. PSA results:  6/17 3.08 11/18 5.4 10/19 7.99 12/19 7.0 10/20 6.72 10/23 10.19 11/24 13.33  Attempts were made at obtaining a MRI in 2020 but insurance denied the imaging study.  Has a history of BPH with lower urinary tract symptoms.  He has been managed with tamsulosin  0.4 mg daily. He is no longer taking tamsulosin .  He has some urgency, frequency, weak stream, and sensation of incomplete emptying.  No dysuria or gross hematuria. IPSS = 14 QOL = 2.  He underwent evaluation with a transrectal ultrasound and biopsy of the prostate on 06/26/23. PSA: 13.33 ng/ml TRUS volume:  45.1 ml  PSA density:  0.29 Biopsy results:             Gleason score: 3+ 4 = 7, 3 + 3 = 6            # positive cores: 2/6 on right    3/6 on left            Location of cancer: apex, mid gland, and base  Complications after biopsy: none Patient without significant LUTS.    IPSS = 11 Patient without erectile dysfunction.    Decipher test showed low risk. Prostate MRI from 10/07/2023 showed the following: PI-RADS 4 left mid gland and apex PI-RADS 3 left apex PI-RADS 3 left apex PI-RADS 4 left mid gland PI-RADS 3 right apex Prostate volume = 52.3 cm  PSA results: 5/25 13.1  He returns today for follow-up.  No new lower urinary tract symptoms.  No dysuria or gross hematuria.  No weight loss or bone pain. IPSS = 7/2.  Portions of the above documentation were copied from a prior visit for review purposes only.   Past Medical History:  Past Medical History:  Diagnosis Date   Depression    Empyema lung (HCC) 01/13/2017   Frequent headaches    Hypertension    Multifocal pneumonia 01/07/2017    Past Surgical History:  Past Surgical History:  Procedure Laterality Date   chest wall cyst     FINGER SURGERY Right    VIDEO ASSISTED THORACOSCOPY (VATS)/EMPYEMA Left 01/13/2017   Procedure: VIDEO ASSISTED THORACOSCOPY  (VATS) DRAIN EMPYEMA;  Surgeon: Fleeta Hanford Coy, MD;  Location: [facility] OR;  Service: Thoracic;  Laterality: Left;    Allergies:  No Known Allergies  Family History:  Family History  Problem Relation Age of Onset   Depression Other     Social History:  Social History   Tobacco Use   Smoking status: Never   Smokeless tobacco: Never  Vaping Use   Vaping status: Never Used  Substance Use Topics   Alcohol use: Yes    Comment: occasionally   Drug use: No    ROS: Constitutional:  Negative for fever, chills, weight loss CV: Negative for chest pain, previous MI, hypertension Respiratory:  Negative for shortness of breath, wheezing, sleep apnea, frequent cough GI:  Negative for nausea, vomiting, bloody stool, GERD  Physical exam: BP (!) 146/80   Pulse (!) 101   Ht 5' 8 (1.727 m)   Wt 215 lb (97.5 kg)   BMI 32.69 kg/m  GENERAL APPEARANCE:  Well appearing, well developed, well nourished, NAD HEENT:  Atraumatic, normocephalic, oropharynx clear NECK:  Supple without lymphadenopathy or thyromegaly ABDOMEN:  Soft, non-tender, no masses EXTREMITIES:  Moves all extremities well, without clubbing, cyanosis, or edema NEUROLOGIC:  Alert and oriented x 3, normal gait, CN II-XII grossly intact MENTAL STATUS:  appropriate BACK:  Non-tender to palpation, No CVAT SKIN:  Warm, dry, and intact   Results: U/A: negative

## 2024-05-04 ENCOUNTER — Other Ambulatory Visit: Payer: Self-pay

## 2024-05-04 DIAGNOSIS — I10 Essential (primary) hypertension: Secondary | ICD-10-CM

## 2024-05-04 MED ORDER — AMLODIPINE BESYLATE 5 MG PO TABS: ORAL_TABLET | ORAL | 3 refills | Status: AC

## 2024-05-08 ENCOUNTER — Encounter: Payer: Self-pay | Admitting: Nurse Practitioner

## 2024-05-08 ENCOUNTER — Ambulatory Visit (INDEPENDENT_AMBULATORY_CARE_PROVIDER_SITE_OTHER): Admitting: Nurse Practitioner

## 2024-05-08 VITALS — BP 118/76 | HR 78 | Temp 98.7°F | Ht 68.0 in | Wt 204.4 lb

## 2024-05-08 DIAGNOSIS — Z0000 Encounter for general adult medical examination without abnormal findings: Secondary | ICD-10-CM

## 2024-05-08 DIAGNOSIS — Z23 Encounter for immunization: Secondary | ICD-10-CM | POA: Diagnosis not present

## 2024-05-08 DIAGNOSIS — Z114 Encounter for screening for human immunodeficiency virus [HIV]: Secondary | ICD-10-CM

## 2024-05-08 LAB — LIPID PANEL
Cholesterol: 108 mg/dL (ref 0–200)
HDL: 34.4 mg/dL — ABNORMAL LOW (ref >39.00)
LDL Cholesterol: 56 mg/dL (ref 0–99)
NonHDL: 73.75
Total CHOL/HDL Ratio: 3
Triglycerides: 89 mg/dL (ref 0.0–149.0)
VLDL: 17.8 mg/dL (ref 0.0–40.0)

## 2024-05-08 LAB — CBC WITH DIFFERENTIAL/PLATELET
Basophils Absolute: 0 K/uL (ref 0.0–0.1)
Basophils Relative: 0.3 % (ref 0.0–3.0)
Eosinophils Absolute: 0.2 K/uL (ref 0.0–0.7)
Eosinophils Relative: 2.1 % (ref 0.0–5.0)
HCT: 48 % (ref 39.0–52.0)
Hemoglobin: 16.4 g/dL (ref 13.0–17.0)
Lymphocytes Relative: 33.5 % (ref 12.0–46.0)
Lymphs Abs: 2.5 K/uL (ref 0.7–4.0)
MCHC: 34.1 g/dL (ref 30.0–36.0)
MCV: 87.5 fl (ref 78.0–100.0)
Monocytes Absolute: 0.5 K/uL (ref 0.1–1.0)
Monocytes Relative: 7 % (ref 3.0–12.0)
Neutro Abs: 4.3 K/uL (ref 1.4–7.7)
Neutrophils Relative %: 57.1 % (ref 43.0–77.0)
Platelets: 183 K/uL (ref 150.0–400.0)
RBC: 5.49 Mil/uL (ref 4.22–5.81)
RDW: 13.6 % (ref 11.5–15.5)
WBC: 7.5 K/uL (ref 4.0–10.5)

## 2024-05-08 LAB — PSA: PSA: 10.94 ng/mL — ABNORMAL HIGH (ref 0.10–4.00)

## 2024-05-08 LAB — COMPREHENSIVE METABOLIC PANEL WITH GFR
ALT: 47 U/L (ref 0–53)
AST: 34 U/L (ref 0–37)
Albumin: 4.7 g/dL (ref 3.5–5.2)
Alkaline Phosphatase: 64 U/L (ref 39–117)
BUN: 22 mg/dL (ref 6–23)
CO2: 28 meq/L (ref 19–32)
Calcium: 9.5 mg/dL (ref 8.4–10.5)
Chloride: 106 meq/L (ref 96–112)
Creatinine, Ser: 1.24 mg/dL (ref 0.40–1.50)
GFR: 62.88 mL/min (ref >60.00)
Glucose, Bld: 103 mg/dL — ABNORMAL HIGH (ref 70–99)
Potassium: 4.3 meq/L (ref 3.5–5.1)
Sodium: 142 meq/L (ref 135–145)
Total Bilirubin: 0.7 mg/dL (ref 0.2–1.2)
Total Protein: 7.7 g/dL (ref 6.0–8.3)

## 2024-05-08 LAB — TSH: TSH: 1.41 u[IU]/mL (ref 0.35–5.50)

## 2024-05-08 NOTE — Progress Notes (Signed)
 Established Patient Office Visit  Subjective:  Patient ID: Caleb Avila, male    DOB: 17-Oct-1960  Age: 63 y.o. MRN: [id number]  CC:  Chief Complaint  Patient presents with   Annual Exam   Discussed the use of AI scribe software for clinical note transcription with the patient, who gave verbal consent to proceed.  History of Present Illness   Patient presents to the clinic for annual physical exam.  Diet: He maintains a diet high in red meat and fried chicken but includes fruits and vegetables. He primarily drinks water, occasionally sweet tea, and rarely consumes alcohol.   Exercise: Walk at work and walk on weekends  Vaccine Flu:Due Tetanus:Due Shingles: completed Pneumonia: Due Hepatitis B: Due  Colon cancer screening: Due Prostate cancer screening: low-grade prostate cancer under surveillance, followed by urology  Family history:  Colon cancer: No  Prostate cancer: No  Dentist:Regularly  Ophthalmology:Regularly  HIV screening:due Hep C screening:due Tobacco use:No Alcohol ldz:nrrjdpnwjoob Illicit drugs:No  Past Medical History:  Diagnosis Date   Depression    Empyema lung (HCC) 01/13/2017   Frequent headaches    Hypertension    Multifocal pneumonia 01/07/2017    Past Surgical History:  Procedure Laterality Date   chest wall cyst     FINGER SURGERY Right    VIDEO ASSISTED THORACOSCOPY (VATS)/EMPYEMA Left 01/13/2017   Procedure: VIDEO ASSISTED THORACOSCOPY (VATS) DRAIN EMPYEMA;  Surgeon: Fleeta Hanford Coy, MD;  Location: [facility] OR;  Service: Thoracic;  Laterality: Left;    Family History  Problem Relation Age of Onset   Depression Other    Stroke Mother    Arthritis Mother    Hearing loss Mother    Cancer Father     Social History   Socioeconomic History   Marital status: Significant Other    Spouse name: Not on file   Number of children: Not on file   Years of education: Not on file   Highest education level: GED or equivalent   Occupational History   Not on file  Tobacco Use   Smoking status: Never   Smokeless tobacco: Never  Vaping Use   Vaping status: Never Used  Substance and Sexual Activity   Alcohol use: Not Currently    Alcohol/week: 1.0 standard drink of alcohol    Types: 1 Standard drinks or equivalent per week    Comment: occasionally   Drug use: No   Sexual activity: Yes    Birth control/protection: Condom  Other Topics Concern   Not on file  Social History Narrative   Not on file   Social Drivers of Health   Financial Resource Strain: Low Risk  (05/04/2024)   Overall Financial Resource Strain (CARDIA)    Difficulty of Paying Living Expenses: Not hard at all  Food Insecurity: No Food Insecurity (05/04/2024)   Hunger Vital Sign    Worried About Radiation Protection Practitioner of Food in the Last Year: Never true    Ran Out of Food in the Last Year: Never true  Transportation Needs: No Transportation Needs (05/04/2024)   PRAPARE - Administrator, Civil Service (Medical): No    Lack of Transportation (Non-Medical): No  Physical Activity: Insufficiently Active (05/04/2024)   Exercise Vital Sign    Days of Exercise per Week: 2 days    Minutes of Exercise per Session: 60 min  Stress: No Stress Concern Present (05/04/2024)   [employer] of Occupational Health - Occupational Stress Questionnaire    Feeling of Stress: Not at  all  Social Connections: Moderately Isolated (05/04/2024)   Social Connection and Isolation Panel    Frequency of Communication with Friends and Family: More than three times a week    Frequency of Social Gatherings with Friends and Family: More than three times a week    Attends Religious Services: 1 to 4 times per year    Active Member of [employer] or Organizations: No    Attends Engineer, Structural: Not on file    Marital Status: Divorced  Catering Manager Violence: Not on file     Outpatient Medications Prior to Visit  Medication Sig Dispense Refill    amLODipine  (NORVASC ) 5 MG tablet TAKE 1 TABLET BY MOUTH DAILY  MUST USE MAIL ORDER FOR MAINT  MEDS 90 tablet 3   aspirin  EC 325 MG EC tablet Take 1 tablet (325 mg total) by mouth daily. 30 tablet 0   Multiple Vitamin (MULTIVITAMIN) tablet Take 1 tablet by mouth daily.     rosuvastatin  (CRESTOR ) 20 MG tablet Take 1 tablet (20 mg total) by mouth daily. 90 tablet 3   No facility-administered medications prior to visit.    No Known Allergies  ROS Review of Systems Negative unless indicated in HPI.    Objective:    Physical Exam Constitutional:      Appearance: Normal appearance. He is normal weight.  HENT:     Head: Normocephalic.     Right Ear: Tympanic membrane normal.     Left Ear: Tympanic membrane normal.     Mouth/Throat:     Mouth: Mucous membranes are moist.  Eyes:     Extraocular Movements: Extraocular movements intact.     Conjunctiva/sclera: Conjunctivae normal.     Pupils: Pupils are equal, round, and reactive to light.  Neck:     Thyroid : No thyroid  mass or thyroid  tenderness.  Cardiovascular:     Rate and Rhythm: Normal rate and regular rhythm.     Pulses: Normal pulses.     Heart sounds: Normal heart sounds. No murmur heard. Pulmonary:     Effort: Pulmonary effort is normal.     Breath sounds: Normal breath sounds.  Abdominal:     General: Bowel sounds are normal.     Palpations: Abdomen is soft. There is no mass.     Tenderness: There is no abdominal tenderness. There is no rebound.  Musculoskeletal:        General: No swelling.     Cervical back: Neck supple. No tenderness.     Right lower leg: No edema.     Left lower leg: No edema.  Skin:    Findings: No bruising, erythema or rash.  Neurological:     General: No focal deficit present.     Mental Status: He is alert and oriented to person, place, and time. Mental status is at baseline.  Psychiatric:        Mood and Affect: Mood normal.        Behavior: Behavior normal.        Thought Content:  Thought content normal.        Judgment: Judgment normal.     BP 118/76   Pulse 78   Temp 98.7 F (37.1 C)   Ht 5' 8 (1.727 m)   Wt 204 lb 6.4 oz (92.7 kg)   SpO2 93%   BMI 31.08 kg/m  Wt Readings from Last 3 Encounters:  05/08/24 204 lb 6.4 oz (92.7 kg)  04/27/24 215 lb (97.5 kg)  01/24/24  200 lb 9.6 oz (91 kg)     Health Maintenance  Topic Date Due   Colonoscopy  Never done   Pneumococcal Vaccine: 50+ Years (1 of 1 - PCV) Never done   COVID-19 Vaccine (5 - 2025-26 season) 05/24/2024 (Originally 02/17/2024)   DTaP/Tdap/Td (2 - Td or Tdap) 05/08/2034   Influenza Vaccine  Completed   Hepatitis C Screening  Completed   HIV Screening  Completed   Zoster Vaccines- Shingrix  Completed   Hepatitis B Vaccines 19-59 Average Risk  Aged Out   HPV VACCINES  Aged Out   Meningococcal B Vaccine  Aged Out    There are no preventive care reminders to display for this patient.  Lab Results  Component Value Date   TSH 1.26 03/28/2018   Lab Results  Component Value Date   WBC 6.7 04/16/2022   HGB 16.6 04/16/2022   HCT 49.2 04/16/2022   MCV 89.7 04/16/2022   PLT 171.0 04/16/2022   Lab Results  Component Value Date   NA 143 04/22/2023   K 4.2 04/22/2023   CO2 28 04/22/2023   GLUCOSE 102 (H) 04/22/2023   BUN 21 04/22/2023   CREATININE 1.10 04/22/2023   BILITOT 0.5 04/22/2023   ALKPHOS 64 04/22/2023   AST 19 04/22/2023   ALT 23 04/22/2023   PROT 7.5 04/22/2023   ALBUMIN  4.5 04/22/2023   CALCIUM  9.7 04/22/2023   ANIONGAP 10 01/17/2017   GFR 73.14 04/22/2023   Lab Results  Component Value Date   CHOL 98 04/22/2023   Lab Results  Component Value Date   HDL 39.50 04/22/2023   Lab Results  Component Value Date   LDLCALC 46 04/22/2023   Lab Results  Component Value Date   TRIG 64.0 04/22/2023   Lab Results  Component Value Date   CHOLHDL 2 04/22/2023   Lab Results  Component Value Date   HGBA1C 5.6 04/22/2023      Assessment & Plan:   Assessment &  Plan Routine general medical examination at a health care facility Physical exam completed.  Encouraged healthy diet and exercise. Referred to GI for colon cancer screening. Flu and Dtap vaccine given today.  Lab work as outlined. -Discussed pneumonia and Hepatitis B vaccine. -Pt deferred to next visit.   Orders:   CBC with Differential/Platelet   Comprehensive metabolic panel with GFR   Hepatitis C antibody   Lipid panel   PSA   TSH  Encounter for screening for HIV  Orders:   HIV Antibody (routine testing w rflx)  Encounter for administration of vaccine  Orders:   Tdap vaccine greater than or equal to 7yo IM  Immunization due  Orders:   Flu vaccine trivalent PF, 6mos and older(Flulaval,Afluria,Fluarix,Fluzone)   Assessment and Plan Assessment & Plan    Follow-up: Return in about 6 months (around 11/05/2024) for follow up with fasting lab 2 days prior, chronic management.   Mellanie Bejarano, NP

## 2024-05-08 NOTE — Assessment & Plan Note (Addendum)
 Physical exam completed.  Encouraged healthy diet and exercise. Referred to GI for colon cancer screening. Flu and Dtap vaccine given today.  Lab work as outlined. -Discussed pneumonia and Hepatitis B vaccine. -Pt deferred to next visit.   Orders:   CBC with Differential/Platelet   Comprehensive metabolic panel with GFR   Hepatitis C antibody   Lipid panel   PSA   TSH

## 2024-05-08 NOTE — Patient Instructions (Signed)

## 2024-05-09 LAB — HEPATITIS C ANTIBODY: Hepatitis C Ab: NONREACTIVE

## 2024-05-09 LAB — HIV ANTIBODY (ROUTINE TESTING W REFLEX)
HIV 1&2 Ab, 4th Generation: NONREACTIVE
HIV FINAL INTERPRETATION: NEGATIVE

## 2024-05-10 ENCOUNTER — Ambulatory Visit: Payer: Self-pay | Admitting: Nurse Practitioner

## 2024-05-10 NOTE — Progress Notes (Signed)
 Please inform patient: Electrolytes, liver, kidney and blood cells normal. HIV and hep C negative. Cholesterol stable. PSA level decreased compared to previous lab but still elevated. Continue follow-up with urology.

## 2024-05-11 NOTE — Progress Notes (Signed)
 LVM stating for pt to call office back to receive results. When pt calls back ok to give results and document

## 2024-06-24 ENCOUNTER — Other Ambulatory Visit (HOSPITAL_BASED_OUTPATIENT_CLINIC_OR_DEPARTMENT_OTHER): Payer: Self-pay | Admitting: Urology

## 2024-06-24 ENCOUNTER — Ambulatory Visit: Admitting: Urology

## 2024-06-24 ENCOUNTER — Ambulatory Visit (HOSPITAL_BASED_OUTPATIENT_CLINIC_OR_DEPARTMENT_OTHER)
Admission: RE | Admit: 2024-06-24 | Discharge: 2024-06-24 | Disposition: A | Discharge status: Home / Self Care | Source: Ambulatory Visit | Attending: Urology | Admitting: Urology

## 2024-06-24 ENCOUNTER — Encounter: Payer: Self-pay | Admitting: Urology

## 2024-06-24 VITALS — BP 143/93 | HR 73 | Ht 68.0 in | Wt 207.0 lb

## 2024-06-24 DIAGNOSIS — N4231 Prostatic intraepithelial neoplasia: Secondary | ICD-10-CM | POA: Insufficient documentation

## 2024-06-24 DIAGNOSIS — C61 Malignant neoplasm of prostate: Secondary | ICD-10-CM

## 2024-06-24 DIAGNOSIS — N4232 Atypical small acinar proliferation of prostate: Secondary | ICD-10-CM | POA: Diagnosis not present

## 2024-06-24 DIAGNOSIS — Z2989 Encounter for other specified prophylactic measures: Secondary | ICD-10-CM

## 2024-06-24 LAB — URINALYSIS, ROUTINE W REFLEX MICROSCOPIC
Bilirubin, UA: NEGATIVE
Glucose, UA: NEGATIVE
Ketones, UA: NEGATIVE
Leukocytes,UA: NEGATIVE
Nitrite, UA: NEGATIVE
Protein,UA: NEGATIVE
RBC, UA: NEGATIVE
Specific Gravity, UA: 1.02 (ref 1.005–1.030)
Urobilinogen, Ur: 0.2 mg/dL (ref 0.2–1.0)
pH, UA: 6 (ref 5.0–7.5)

## 2024-06-24 MED ORDER — CEFTRIAXONE SODIUM 1 G IJ SOLR
1.0000 g | Freq: Once | INTRAMUSCULAR | Status: AC
  Administered 2024-06-24: 1 g via INTRAMUSCULAR

## 2024-06-24 NOTE — Progress Notes (Addendum)
 "  Assessment: 1. Prostate cancer (HCC); PSA 13.33; GG 1,2; T1cNxMx; favorable intermediate risk; bx 1/25     Plan: Post biopsy instructions given Return to office in 7-10 day for biopsy results  Chief Complaint:  Chief Complaint  Patient presents with   Prostate Biopsy    History of Present Illness:  Caleb Avila is a 64 y.o. male who is seen for continued evaluation of favorable intermediate risk prostate cancer.    He had previously been seen by Dr. Twylla in [facility] for evaluation of elevated PSA with his last visit there in January 2020. PSA results:  6/17 3.08 11/18 5.4 10/19 7.99 12/19 7.0 10/20 6.72 10/23 10.19 11/24 13.33  Attempts were made at obtaining a MRI in 2020 but insurance denied the imaging study.  He has a history of BPH with lower urinary tract symptoms, previously managed with tamsulosin  0.4 mg daily. He was no longer taking tamsulosin .  He reported some urgency, frequency, weak stream, and sensation of incomplete emptying.  No dysuria or gross hematuria. IPSS = 14/2.  He underwent evaluation with a transrectal ultrasound and biopsy of the prostate on 06/26/23. PSA: 13.33 ng/ml TRUS volume:  45.1 ml  PSA density:  0.29 Biopsy results:             Gleason score: 3+ 4 = 7, 3 + 3 = 6            # positive cores: 2/6 on right    3/6 on left            Location of cancer: apex, mid gland, and base  Complications after biopsy: none  Decipher test showed low risk. Prostate MRI from 10/07/2023 showed the following: PI-RADS 4 left mid gland and apex PI-RADS 3 left apex PI-RADS 3 left apex PI-RADS 4 left mid gland PI-RADS 3 right apex Prostate volume = 52.3 cm  PSA results: 5/25 13.1 11/25 10.94  He presents today for a confirmatory prostate biopsy.  Portions of the above documentation were copied from a prior visit for review purposes only.   Past Medical History:  Past Medical History:  Diagnosis Date   Depression    Empyema  lung (HCC) 01/13/2017   Frequent headaches    Hypertension    Multifocal pneumonia 01/07/2017    Past Surgical History:  Past Surgical History:  Procedure Laterality Date   chest wall cyst     FINGER SURGERY Right    VIDEO ASSISTED THORACOSCOPY (VATS)/EMPYEMA Left 01/13/2017   Procedure: VIDEO ASSISTED THORACOSCOPY (VATS) DRAIN EMPYEMA;  Surgeon: Fleeta Hanford Coy, MD;  Location: [facility] OR;  Service: Thoracic;  Laterality: Left;    Allergies:  No Known Allergies  Family History:  Family History  Problem Relation Age of Onset   Depression Other    Stroke Mother    Arthritis Mother    Hearing loss Mother    Cancer Father     Social History:  Social History   Tobacco Use   Smoking status: Never   Smokeless tobacco: Never  Vaping Use   Vaping status: Never Used  Substance Use Topics   Alcohol use: Not Currently    Alcohol/week: 1.0 standard drink of alcohol    Types: 1 Standard drinks or equivalent per week    Comment: occasionally   Drug use: No    ROS: Constitutional:  Negative for fever, chills, weight loss CV: Negative for chest pain, previous MI, hypertension Respiratory:  Negative for shortness of breath, wheezing, sleep  apnea, frequent cough GI:  Negative for nausea, vomiting, bloody stool, GERD  Physical exam: BP (!) 143/93   Pulse 73   Ht 5' 8 (1.727 m)   Wt 207 lb (93.9 kg)   BMI 31.47 kg/m  GENERAL APPEARANCE:  Well appearing, well developed, well nourished, NAD   Results: U/A: negative  TRANSRECTAL ULTRASOUND AND PROSTATE BIOPSY  Indication:  Prostate cancer  Prophylactic antibiotic administration: Rocephin   All medications that could result in increased bleeding were discontinued within an appropriate period of the time of biopsy.  Risk including bleeding and infection were discussed.  Informed consent was obtained.  The patient was placed in the left lateral decubitus position.  PROCEDURE 1.  TRANSRECTAL ULTRASOUND OF THE PROSTATE  The 7  MHz transrectal probe was used to image the prostate.  Anal stenosis was not noted.  TRUS volume: 59 ml  Hypoechoic areas: Right and apex  Hyperechoic areas: None  Central calcifications: not present  Margins:  normal  Seminal Vesicles: atrophic left SV; large right SV   PROCEDURE 2:  TRANSRECTAL PROSTATE BIOPSY  A periprostatic block was performed using 1% lidocaine  and transrectal ultrasound guidance. Under transrectal ultrasound guidance, and using the Biopty gun, prostate biopsies were obtained systematically from the apex, mid gland, and base bilaterally.  A total of 12 cores were obtained.  Hemostasis was obtained with gentle pressure on the prostate.  The procedures were well-tolerated.  No significant bleeding was noted at the end of the procedure.  The patient was stable for discharge from the office.     "

## 2024-06-24 NOTE — Progress Notes (Signed)
 IM Injection  Patient is present today for an IM Injection for treatment of infection prevention post prostate biopsy Drug: Ceftriaxone  Dose:1g Location:right upper outer buttocks NDC: 9218-6791-14 Lot: [id number] Exp:01/15/2026 Patient tolerated well, no complications were noted  Performed by: JAYSON Mainland CMA

## 2024-06-25 ENCOUNTER — Other Ambulatory Visit: Admitting: Urology

## 2024-06-29 ENCOUNTER — Encounter: Payer: Self-pay | Admitting: Urology

## 2024-06-29 LAB — PROSTATE CORE NEEDLE BIOPSY

## 2024-07-01 ENCOUNTER — Ambulatory Visit: Payer: Self-pay | Admitting: Urology

## 2024-07-07 ENCOUNTER — Encounter: Payer: Self-pay | Admitting: Urology

## 2024-07-07 ENCOUNTER — Ambulatory Visit: Admitting: Urology

## 2024-07-07 VITALS — BP 130/87 | HR 82

## 2024-07-07 DIAGNOSIS — N401 Enlarged prostate with lower urinary tract symptoms: Secondary | ICD-10-CM

## 2024-07-07 DIAGNOSIS — C61 Malignant neoplasm of prostate: Secondary | ICD-10-CM

## 2024-07-07 DIAGNOSIS — N138 Other obstructive and reflux uropathy: Secondary | ICD-10-CM

## 2024-07-07 LAB — URINALYSIS, ROUTINE W REFLEX MICROSCOPIC
Bilirubin, UA: NEGATIVE
Glucose, UA: NEGATIVE
Leukocytes,UA: NEGATIVE
Nitrite, UA: NEGATIVE
Protein,UA: NEGATIVE
RBC, UA: NEGATIVE
Specific Gravity, UA: 1.02 (ref 1.005–1.030)
Urobilinogen, Ur: 1 mg/dL (ref 0.2–1.0)
pH, UA: 5 (ref 5.0–7.5)

## 2024-07-07 LAB — MICROSCOPIC EXAMINATION: Bacteria, UA: NONE SEEN

## 2024-07-07 NOTE — Progress Notes (Signed)
 "  Assessment: 1. Prostate cancer [facility]); PSA 13.33; GG 1,2; T1cNxMx; unfavorable intermediate risk; bx 1/25   2. BPH with obstruction/lower urinary tract symptoms     Plan: I reviewed the pathology results with the patient today.  He actually falls into the unfavorable intermediate risk group based on NCCN guidelines (2 intermediate risk factors -PSA >10, GG 2) although he does have favorable factors (low-volume GG 2, 8/12 cores negative).  His prior decipher test showed low risk based on genomic characteristics. I discussed the options of continued active surveillance versus definitive therapy with surgery or radiation. The risk of treatment for prostate cancer reviewed. Recommend repeating genomic evaluation with decipher testing to assist with treatment decision (continued AS vs. Definitive treatment) Will contact him with results and to arrange follow-up.   Today, I personally spent 30 minutes in direct face to face time with the patient, of which greater than 50% of the time was spent in patient education and counseling as described above.   Chief Complaint:  Chief Complaint  Patient presents with   Prostate Cancer    History of Present Illness:  Caleb Avila is a 64 y.o. male who is seen for continued evaluation of intermediate risk prostate cancer.    He had previously been seen by Dr. Twylla in [facility] for evaluation of elevated PSA with his last visit there in January 2020. PSA results:  6/17 3.08 11/18 5.4 10/19 7.99 12/19 7.0 10/20 6.72 10/23 10.19 11/24 13.33  Attempts were made at obtaining a MRI in 2020 but insurance denied the imaging study.  He has a history of BPH with lower urinary tract symptoms, previously managed with tamsulosin  0.4 mg daily. He was no longer taking tamsulosin .  He reported some urgency, frequency, weak stream, and sensation of incomplete emptying.  No dysuria or gross hematuria. IPSS = 14/2.  He underwent evaluation with a  transrectal ultrasound and biopsy of the prostate on 06/26/23. PSA: 13.33 ng/ml TRUS volume:  45.1 ml  PSA density:  0.29 Biopsy results:             Gleason score: 3+ 4 = 7, 3 + 3 = 6            # positive cores: 2/6 on right    3/6 on left            Location of cancer: apex, mid gland, and base  Complications after biopsy: none  Decipher test showed low risk. Prostate MRI from 10/07/2023 showed the following: PI-RADS 4 left mid gland and apex PI-RADS 3 left apex PI-RADS 3 left apex PI-RADS 4 left mid gland PI-RADS 3 right apex Prostate volume = 52.3 cm  PSA results: 5/25 13.1 11/25 10.94  He returns today following his confirmatory transrectal ultrasound and biopsy of the prostate on 06/24/24. PSA: 10.9 ng/ml TRUS volume:  52.3 ml  PSA density:  0.2 Biopsy results:             Gleason score: 3+ 4 = 7            # positive cores: 1/6 on right    3/6 on left            Location of cancer: apex, base  Complications after biopsy: none  NCCN risk group:  unfavorable intermediate risk (2 IRFs)   Portions of the above documentation were copied from a prior visit for review purposes only.   Past Medical History:  Past Medical History:  Diagnosis Date  Depression    Empyema lung (HCC) 01/13/2017   Frequent headaches    Hypertension    Multifocal pneumonia 01/07/2017    Past Surgical History:  Past Surgical History:  Procedure Laterality Date   chest wall cyst     FINGER SURGERY Right    VIDEO ASSISTED THORACOSCOPY (VATS)/EMPYEMA Left 01/13/2017   Procedure: VIDEO ASSISTED THORACOSCOPY (VATS) DRAIN EMPYEMA;  Surgeon: Fleeta Hanford Coy, MD;  Location: [facility] OR;  Service: Thoracic;  Laterality: Left;    Allergies:  No Known Allergies  Family History:  Family History  Problem Relation Age of Onset   Depression Other    Stroke Mother    Arthritis Mother    Hearing loss Mother    Cancer Father     Social History:  Social History   Tobacco Use   Smoking status:  Never   Smokeless tobacco: Never  Vaping Use   Vaping status: Never Used  Substance Use Topics   Alcohol use: Not Currently    Alcohol/week: 1.0 standard drink of alcohol    Types: 1 Standard drinks or equivalent per week    Comment: occasionally   Drug use: No    ROS: Constitutional:  Negative for fever, chills, weight loss CV: Negative for chest pain, previous MI, hypertension Respiratory:  Negative for shortness of breath, wheezing, sleep apnea, frequent cough GI:  Negative for nausea, vomiting, bloody stool, GERD  Physical exam: BP 130/87   Pulse 82  GENERAL APPEARANCE:  Well appearing, well developed, well nourished, NAD   Results: U/A: 0-5 WBCs, 0-2 RBCs "

## 2024-11-02 ENCOUNTER — Other Ambulatory Visit

## 2024-11-06 ENCOUNTER — Ambulatory Visit: Admitting: Nurse Practitioner
